# Patient Record
Sex: Male | Born: 1940 | Race: White | Hispanic: No | Smoking: Never smoker
Health system: Southern US, Community
[De-identification: ages and names within clinical notes are randomized; demographics above are authoritative.]

## PROBLEM LIST (undated history)

## (undated) ENCOUNTER — Emergency Department (HOSPITAL_COMMUNITY): Discharge status: Absent without leave | Payer: Medicare Other

## (undated) DIAGNOSIS — I639 Cerebral infarction, unspecified: Secondary | ICD-10-CM

## (undated) DIAGNOSIS — E039 Hypothyroidism, unspecified: Secondary | ICD-10-CM

## (undated) DIAGNOSIS — F1010 Alcohol abuse, uncomplicated: Secondary | ICD-10-CM

## (undated) DIAGNOSIS — K219 Gastro-esophageal reflux disease without esophagitis: Secondary | ICD-10-CM

## (undated) DIAGNOSIS — J189 Pneumonia, unspecified organism: Secondary | ICD-10-CM

## (undated) DIAGNOSIS — G9340 Encephalopathy, unspecified: Secondary | ICD-10-CM

## (undated) DIAGNOSIS — C61 Malignant neoplasm of prostate: Secondary | ICD-10-CM

## (undated) DIAGNOSIS — I10 Essential (primary) hypertension: Secondary | ICD-10-CM

## (undated) DIAGNOSIS — N400 Enlarged prostate without lower urinary tract symptoms: Secondary | ICD-10-CM

## (undated) DIAGNOSIS — F0390 Unspecified dementia without behavioral disturbance: Secondary | ICD-10-CM

## (undated) DIAGNOSIS — R569 Unspecified convulsions: Secondary | ICD-10-CM

## (undated) DIAGNOSIS — Z9889 Other specified postprocedural states: Secondary | ICD-10-CM

## (undated) HISTORY — PX: EYE SURGERY: SHX253

## (undated) HISTORY — DX: Unspecified convulsions: R56.9

## (undated) HISTORY — PX: PROSTATE BIOPSY: SHX241

---

## 2011-05-04 ENCOUNTER — Emergency Department (HOSPITAL_COMMUNITY): Payer: Medicare Other

## 2011-05-04 ENCOUNTER — Encounter (HOSPITAL_COMMUNITY): Payer: Self-pay | Admitting: Emergency Medicine

## 2011-05-04 ENCOUNTER — Emergency Department (HOSPITAL_COMMUNITY)
Admission: EM | Admit: 2011-05-04 | Discharge: 2011-05-04 | Disposition: A | Discharge status: Skilled Nursing Facility | Payer: Medicare Other | Attending: Emergency Medicine | Admitting: Emergency Medicine

## 2011-05-04 ENCOUNTER — Other Ambulatory Visit: Payer: Self-pay

## 2011-05-04 ENCOUNTER — Emergency Department (HOSPITAL_COMMUNITY)
Admission: EM | Admit: 2011-05-04 | Discharge: 2011-05-04 | Discharge status: Absent without leave | Payer: Medicare Other | Source: Home / Self Care | Attending: Emergency Medicine | Admitting: Emergency Medicine

## 2011-05-04 DIAGNOSIS — I10 Essential (primary) hypertension: Secondary | ICD-10-CM | POA: Insufficient documentation

## 2011-05-04 DIAGNOSIS — E876 Hypokalemia: Secondary | ICD-10-CM | POA: Insufficient documentation

## 2011-05-04 DIAGNOSIS — E039 Hypothyroidism, unspecified: Secondary | ICD-10-CM | POA: Insufficient documentation

## 2011-05-04 DIAGNOSIS — R55 Syncope and collapse: Secondary | ICD-10-CM | POA: Insufficient documentation

## 2011-05-04 DIAGNOSIS — M79609 Pain in unspecified limb: Secondary | ICD-10-CM | POA: Insufficient documentation

## 2011-05-04 DIAGNOSIS — R51 Headache: Secondary | ICD-10-CM | POA: Insufficient documentation

## 2011-05-04 DIAGNOSIS — F068 Other specified mental disorders due to known physiological condition: Secondary | ICD-10-CM | POA: Insufficient documentation

## 2011-05-04 DIAGNOSIS — E119 Type 2 diabetes mellitus without complications: Secondary | ICD-10-CM | POA: Insufficient documentation

## 2011-05-04 DIAGNOSIS — Z79899 Other long term (current) drug therapy: Secondary | ICD-10-CM | POA: Insufficient documentation

## 2011-05-04 DIAGNOSIS — Z7982 Long term (current) use of aspirin: Secondary | ICD-10-CM | POA: Insufficient documentation

## 2011-05-04 HISTORY — DX: Unspecified dementia without behavioral disturbance: F03.90

## 2011-05-04 HISTORY — DX: Benign prostatic hyperplasia without lower urinary tract symptoms: N40.0

## 2011-05-04 HISTORY — DX: Hypothyroidism, unspecified: E03.9

## 2011-05-04 HISTORY — DX: Pneumonia, unspecified organism: J18.9

## 2011-05-04 HISTORY — DX: Essential (primary) hypertension: I10

## 2011-05-04 HISTORY — DX: Encephalopathy, unspecified: G93.40

## 2011-05-04 HISTORY — DX: Unspecified dementia, unspecified severity, without behavioral disturbance, psychotic disturbance, mood disturbance, and anxiety: F03.90

## 2011-05-04 HISTORY — DX: Alcohol abuse, uncomplicated: F10.10

## 2011-05-04 LAB — BASIC METABOLIC PANEL
CO2: 24 mEq/L (ref 19–32)
Calcium: 9 mg/dL (ref 8.4–10.5)
Creatinine, Ser: 1.26 mg/dL (ref 0.50–1.35)
GFR calc Af Amer: 64 mL/min — ABNORMAL LOW (ref >90)
GFR calc non Af Amer: 55 mL/min — ABNORMAL LOW (ref >90)
Sodium: 138 mEq/L (ref 135–145)

## 2011-05-04 LAB — URINALYSIS, ROUTINE W REFLEX MICROSCOPIC
Ketones, ur: NEGATIVE mg/dL
Leukocytes, UA: NEGATIVE
Protein, ur: 30 mg/dL — AB
Urobilinogen, UA: 0.2 mg/dL (ref 0.0–1.0)

## 2011-05-04 LAB — DIFFERENTIAL
Basophils Absolute: 0 10*3/uL (ref 0.0–0.1)
Basophils Relative: 1 % (ref 0–1)
Eosinophils Absolute: 0.1 10*3/uL (ref 0.0–0.7)
Eosinophils Relative: 1 % (ref 0–5)
Lymphocytes Relative: 28 % (ref 12–46)

## 2011-05-04 LAB — CBC
MCHC: 34.3 g/dL (ref 30.0–36.0)
MCV: 107.9 fL — ABNORMAL HIGH (ref 78.0–100.0)
Platelets: 227 10*3/uL (ref 150–400)
RDW: 13.3 % (ref 11.5–15.5)
WBC: 5.2 10*3/uL (ref 4.0–10.5)

## 2011-05-04 LAB — URINE MICROSCOPIC-ADD ON

## 2011-05-04 MED ORDER — POTASSIUM CHLORIDE CRYS ER 20 MEQ PO TBCR
20.0000 meq | EXTENDED_RELEASE_TABLET | Freq: Once | ORAL | Status: AC
  Administered 2011-05-04: 20 meq via ORAL
  Filled 2011-05-04: qty 1

## 2011-05-04 MED ORDER — SODIUM CHLORIDE 0.9 % IV SOLN: INTRAVENOUS | Status: DC

## 2011-05-04 NOTE — ED Notes (Signed)
Per EMS, pt finished physical therapy at NH, was seen slumped in chair with LOC x 3 minutes.  CBG: 80; EKG shows R BBB.  PIV to L FA.  Pt just discharged from [facility] on 11/19 with pneumonia.

## 2011-05-04 NOTE — ED Notes (Signed)
Pt presents with report of syncopal episode after physical therapy today.  Per EMS, pt was seated with syncopal episode x 2-3 minutes.  Pt was gray-colored.  CBG: 80, PIV is 20g to L forearm.

## 2011-05-04 NOTE — ED Provider Notes (Signed)
History     CSN: [id number] Arrival date & time: 05/04/2011 12:03 PM  Patient is a 69 y.o. male presenting with syncope. The history is provided by the patient.  Loss of Consciousness This is a new problem. The current episode started less than 1 hour ago. The problem has been rapidly improving. Pertinent negatives include no chest pain, no abdominal pain, no headaches and no shortness of breath. Associated symptoms comments: The patint noticed left leg pain, like his sciatica, that caused him to have to stop doing an exercise. He then moved to a chair. He then was reported to have passed out. He had loss of consciousness for 3 minutes.. He has tried nothing for the symptoms.     The patient is in a rehabilitation following a hospital stay for pneumonia. He was moderately ill during the hospitalization, but has improved. His daughter, with him in the emergency room states that he is at his baseline at this time. He had no injuries or falls in the syncopal episode today. He denies chest pain, increased weakness, or dizziness. He has a mild headache at this time.   I had a telephone conversation with the patient's nurse, Shanda Bumps, at the facility, where he lives. Her background information of the episode this morning are as follows. He was sitting in a chair and passed out. He was moved to the floor. After 3 minutes of face mask oxygen, he came around. He then began talking and returned to his baseline. Vital signs, that are reported are pulse of 60 and pulse of 88. His CBG was 80. His blood pressure was 130/89. It is reported that he did not have a pulse. There is no reported electronic monitoring to indicate a cardiac arrhythmia or other abnormality. I also discussed the case with the facility supervisor, who arrived at the patient's side one minute after he passed out. At that time, he was sitting in a chair, and gray in color. The patient was assisted to the floor, and his feet were elevated. A face  mask was placed. After 3 minutes without assisted ventilation or cardiac compressions, he began to speak. Patient was confused for 1 minute and then he recovered to his baseline.     (Consider location/radiation/quality/duration/timing/severity/associated sxs/prior treatment) HPI  Past Medical History  Diagnosis Date  . Pneumonia   . Encephalopathy   . Dementia   . Hypertension   . Diabetes mellitus   . Hypothyroidism   . Benign prostatic hypertrophy   . Alcohol abuse     History reviewed. No pertinent past surgical history.  History reviewed. No pertinent family history.  History  Substance Use Topics  . Smoking status: Former Games developer  . Smokeless tobacco: Not on file  . Alcohol Use: Yes      Review of Systems  All other systems reviewed and are negative.    Allergies  Review of patient's allergies indicates no known allergies.  Home Medications   Current Outpatient Rx  Name Route Sig Dispense Refill  . ASPIRIN 81 MG PO TABS Oral Take 81 mg by mouth daily.      . DONEPEZIL HCL 5 MG PO TABS Oral Take 5 mg by mouth every evening.      Marland Kitchen DOXAZOSIN MESYLATE 2 MG PO TABS Oral Take 2 mg by mouth at bedtime.      Marland Kitchen FOLIC ACID 1 MG PO TABS Oral Take 1 mg by mouth daily.      Marland Kitchen GLIPIZIDE ER 2.5 MG PO  TB24 Oral Take 2.5 mg by mouth daily.      Marland Kitchen LEVOFLOXACIN 750 MG PO TABS Oral Take 750 mg by mouth daily. Start date 05/01/11 finish date 05/07/11     . LEVOTHYROXINE SODIUM 100 MCG PO TABS Oral Take 100 mcg by mouth daily.      Marland Kitchen LISINOPRIL 5 MG PO TABS Oral Take 5 mg by mouth daily.      Marland Kitchen METOPROLOL TARTRATE 25 MG PO TABS Oral Take 12.5 mg by mouth 2 (two) times daily.      Carma Leaven M PLUS PO TABS Oral Take 1 tablet by mouth daily.      Marland Kitchen TAMSULOSIN HCL 0.4 MG PO CAPS Oral Take 0.4 mg by mouth at bedtime.      Marland Kitchen VITAMIN B-1 100 MG PO TABS Oral Take 100 mg by mouth daily.        BP 152/80  Pulse 80  Temp(Src) 98.7 F (37.1 C) (Oral)  Resp 14  SpO2 98%  Physical  Exam  Constitutional: He appears well-developed and well-nourished.  HENT:  Head: Normocephalic and atraumatic.  Eyes: Pupils are equal, round, and reactive to light.  Neck: Normal range of motion. Neck supple.  Cardiovascular: Normal rate and regular rhythm.   Pulmonary/Chest: Effort normal and breath sounds normal.  Abdominal: Soft. Bowel sounds are normal.  Musculoskeletal: Normal range of motion.  Neurological: He is alert. No cranial nerve deficit. He exhibits normal muscle tone. Coordination normal.  Skin: Skin is warm and dry.  Psychiatric: He has a normal mood and affect. His behavior is normal.    ED Course  Procedures (including critical care time)  Date: 05/04/2011  Rate: 70  Rhythm: normal sinus rhythm  QRS Axis: normal  Intervals: normal  ST/T Wave abnormalities: normal  Conduction Disutrbances:right bundle branch block  Narrative Interpretation:   Old EKG Reviewed: none available    Labs Reviewed  CBC - Abnormal; Notable for the following:    RBC 3.16 (*)    Hemoglobin 11.7 (*)    HCT 34.1 (*)    MCV 107.9 (*)    MCH 37.0 (*)    All other components within normal limits  BASIC METABOLIC PANEL - Abnormal; Notable for the following:    Potassium 3.3 (*)    Glucose, Bld 103 (*)    GFR calc non Af Amer 55 (*)    GFR calc Af Amer 64 (*)    All other components within normal limits  URINALYSIS, ROUTINE W REFLEX MICROSCOPIC - Abnormal; Notable for the following:    Color, Urine AMBER (*) BIOCHEMICALS MAY BE AFFECTED BY COLOR   Protein, ur 30 (*)    All other components within normal limits  URINE MICROSCOPIC-ADD ON - Abnormal; Notable for the following:    Casts GRANULAR CAST (*) HYALINE CASTS   All other components within normal limits  DIFFERENTIAL   Dg Chest Port 1 View  05/04/2011  *RADIOLOGY REPORT*  Clinical Data: Syncope.  PORTABLE CHEST - 1 VIEW  Comparison: None.  Findings: Lungs are clear.  Heart size is normal.  No pneumothorax or pleural  effusion.  IMPRESSION: No acute disease.  Original Report Authenticated By: Bernadene Bell. D'ALESSIO, M.D.  15:02- reevaluation: He remains calm and comfortable. Cardiac monitor shows normal sinus rhythm. Patient relates that he is having pain in his sciatic area or in his right lateral ankle. He denies trauma to this area. He states that he is now having pain currently, but has been  having intermittent pain in the area recently. He has an additional complaint of not sleeping well since arriving to the rehabilitation facility. He states he gets interrupted frequently at nighttime. This could potentially be involved with his period of unresponsiveness.  Emergency Department treatment: By mouth potassium.  1. Syncope   2. Hypokalemia       MDM  Syncope, without clear cause. Evaluation negative the ED. It is possible that he had pain related syncope, possible vasovagal episode. The patient is improved, and has reached baseline with only abnormal finding being mild hypokalemia.        Flint Melter, MD 05/04/11 [phone]

## 2011-06-06 ENCOUNTER — Encounter (HOSPITAL_COMMUNITY): Payer: Self-pay | Admitting: *Deleted

## 2011-06-06 ENCOUNTER — Emergency Department (HOSPITAL_COMMUNITY): Payer: Medicare Other

## 2011-06-06 ENCOUNTER — Emergency Department (HOSPITAL_COMMUNITY)
Admission: EM | Admit: 2011-06-06 | Discharge: 2011-06-06 | Disposition: A | Discharge status: Home / Self Care | Payer: Medicare Other | Attending: Emergency Medicine | Admitting: Emergency Medicine

## 2011-06-06 DIAGNOSIS — F0390 Unspecified dementia without behavioral disturbance: Secondary | ICD-10-CM | POA: Insufficient documentation

## 2011-06-06 DIAGNOSIS — K5900 Constipation, unspecified: Secondary | ICD-10-CM | POA: Insufficient documentation

## 2011-06-06 DIAGNOSIS — K625 Hemorrhage of anus and rectum: Secondary | ICD-10-CM | POA: Insufficient documentation

## 2011-06-06 DIAGNOSIS — I10 Essential (primary) hypertension: Secondary | ICD-10-CM | POA: Insufficient documentation

## 2011-06-06 DIAGNOSIS — K648 Other hemorrhoids: Secondary | ICD-10-CM | POA: Insufficient documentation

## 2011-06-06 DIAGNOSIS — K5641 Fecal impaction: Secondary | ICD-10-CM | POA: Insufficient documentation

## 2011-06-06 DIAGNOSIS — E119 Type 2 diabetes mellitus without complications: Secondary | ICD-10-CM | POA: Insufficient documentation

## 2011-06-06 DIAGNOSIS — K6289 Other specified diseases of anus and rectum: Secondary | ICD-10-CM | POA: Insufficient documentation

## 2011-06-06 DIAGNOSIS — I76 Septic arterial embolism: Secondary | ICD-10-CM | POA: Insufficient documentation

## 2011-06-06 LAB — CBC
Hemoglobin: 12.6 g/dL — ABNORMAL LOW (ref 13.0–17.0)
MCH: 34.1 pg — ABNORMAL HIGH (ref 26.0–34.0)
MCV: 99.7 fL (ref 78.0–100.0)
Platelets: 276 10*3/uL (ref 150–400)
RBC: 3.69 MIL/uL — ABNORMAL LOW (ref 4.22–5.81)
WBC: 10.2 10*3/uL (ref 4.0–10.5)

## 2011-06-06 LAB — DIFFERENTIAL
Eosinophils Absolute: 0 10*3/uL (ref 0.0–0.7)
Eosinophils Relative: 0 % (ref 0–5)
Lymphocytes Relative: 36 % (ref 12–46)
Lymphs Abs: 3.6 10*3/uL (ref 0.7–4.0)
Monocytes Relative: 7 % (ref 3–12)
Neutrophils Relative %: 57 % (ref 43–77)

## 2011-06-06 LAB — BASIC METABOLIC PANEL
BUN: 24 mg/dL — ABNORMAL HIGH (ref 6–23)
CO2: 28 mEq/L (ref 19–32)
GFR calc non Af Amer: 59 mL/min — ABNORMAL LOW (ref >90)
Glucose, Bld: 118 mg/dL — ABNORMAL HIGH (ref 70–99)
Potassium: 4.3 mEq/L (ref 3.5–5.1)
Sodium: 137 mEq/L (ref 135–145)

## 2011-06-06 LAB — URINALYSIS, ROUTINE W REFLEX MICROSCOPIC
Glucose, UA: NEGATIVE mg/dL
Hgb urine dipstick: NEGATIVE
Specific Gravity, Urine: 1.033 — ABNORMAL HIGH (ref 1.005–1.030)

## 2011-06-06 MED ORDER — FLEET ENEMA 7-19 GM/118ML RE ENEM
1.0000 | ENEMA | Freq: Once | RECTAL | Status: DC
  Filled 2011-06-06: qty 1

## 2011-06-06 MED ORDER — SODIUM CHLORIDE 0.9 % IV BOLUS (SEPSIS): 2000.0000 mL | Freq: Once | INTRAVENOUS | Status: DC

## 2011-06-06 MED ORDER — POLYETHYLENE GLYCOL 3350 17 GM/SCOOP PO POWD: ORAL | Status: DC

## 2011-06-06 MED ORDER — BAZA PROTECT EX CREA: TOPICAL_CREAM | Freq: Two times a day (BID) | CUTANEOUS | Status: AC | PRN

## 2011-06-06 MED ORDER — SODIUM CHLORIDE 0.9 % IV BOLUS (SEPSIS)
500.0000 mL | Freq: Once | INTRAVENOUS | Status: AC
  Administered 2011-06-06: 500 mL via INTRAVENOUS

## 2011-06-06 MED ORDER — GLYCERIN (LAXATIVE) 2.1 G RE SUPP
1.0000 | Freq: Once | RECTAL | Status: AC
  Administered 2011-06-06: 1 via RECTAL
  Filled 2011-06-06: qty 1

## 2011-06-06 NOTE — ED Provider Notes (Signed)
History     CSN: [id number]  Arrival date & time 06/06/11  1638   First MD Initiated Contact with Patient 06/06/11 1701      Chief Complaint  Patient presents with  . Rectal Bleeding  . Constipation    (Consider location/radiation/quality/duration/timing/severity/associated sxs/prior treatment) Patient is a 70 y.o. male presenting with hematochezia and constipation. The history is provided by the patient and a relative.  Rectal Bleeding  The current episode started 3 to 5 days ago. The onset was gradual. The problem has been unchanged. The pain is mild. There was no prior successful therapy. Prior unsuccessful therapies include stool softeners, enemas and laxatives. Associated symptoms include anorexia and rectal pain. Pertinent negatives include no fever, no abdominal pain, no diarrhea, no nausea, no vomiting, no hematuria, no difficulty breathing and no rash. He has been behaving normally. He has been drinking less than usual.  Constipation  The current episode started 3 to 5 days ago. The problem has been unchanged. Associated symptoms include anorexia and rectal pain. Pertinent negatives include no fever, no abdominal pain, no diarrhea, no nausea, no vomiting, no hematuria, no difficulty breathing and no rash. He has been drinking less than usual.  Pt states he feels like he needs to have a bowel movement but unable to have one. Does not recall his last one but states it was at least 3-4 days ago. States also having rectal bleeding. Denies abdominal pain, fever, chills, nausea, vomiting. States no history of the same. Tried fleets enemas and biscodyl suppositories last night with no relief. Per family pt was in the hospital a month a go with pneumonia, hyeprglycemiea, AMS, UTI, improving now.   Past Medical History  Diagnosis Date  . Pneumonia   . Encephalopathy   . Dementia   . Hypertension   . Diabetes mellitus   . Hypothyroidism   . Benign prostatic hypertrophy   . Alcohol  abuse     History reviewed. No pertinent past surgical history.  No family history on file.  History  Substance Use Topics  . Smoking status: Former Games developer  . Smokeless tobacco: Not on file  . Alcohol Use: Yes     Hx of etoh abuse      Review of Systems  Constitutional: Negative for fever and chills.  HENT: Negative.   Eyes: Negative.   Respiratory: Negative.   Cardiovascular: Negative.   Gastrointestinal: Positive for constipation, hematochezia, anal bleeding, rectal pain and anorexia. Negative for nausea, vomiting, abdominal pain and diarrhea.  Genitourinary: Negative for dysuria, hematuria, decreased urine volume and difficulty urinating.  Musculoskeletal: Negative.   Skin: Negative for rash.  Neurological: Negative.   Psychiatric/Behavioral: Negative.     Allergies  Review of patient's allergies indicates no known allergies.  Home Medications   Current Outpatient Rx  Name Route Sig Dispense Refill  . ASPIRIN 81 MG PO TABS Oral Take 81 mg by mouth daily.      . DONEPEZIL HCL 5 MG PO TABS Oral Take 5 mg by mouth every evening.      Marland Kitchen DOXAZOSIN MESYLATE 2 MG PO TABS Oral Take 2 mg by mouth at bedtime.      Marland Kitchen FOLIC ACID 1 MG PO TABS Oral Take 1 mg by mouth daily.      Marland Kitchen GLIPIZIDE ER 2.5 MG PO TB24 Oral Take 2.5 mg by mouth daily.      Marland Kitchen LEVOFLOXACIN 750 MG PO TABS Oral Take 750 mg by mouth daily. Start date 05/01/11 finish  date 05/07/11     . LEVOTHYROXINE SODIUM 100 MCG PO TABS Oral Take 100 mcg by mouth daily.      Marland Kitchen LISINOPRIL 5 MG PO TABS Oral Take 5 mg by mouth daily.      Marland Kitchen METOPROLOL TARTRATE 25 MG PO TABS Oral Take 12.5 mg by mouth 2 (two) times daily.      Carma Leaven M PLUS PO TABS Oral Take 1 tablet by mouth daily.      Marland Kitchen TAMSULOSIN HCL 0.4 MG PO CAPS Oral Take 0.4 mg by mouth at bedtime.      Marland Kitchen VITAMIN B-1 100 MG PO TABS Oral Take 100 mg by mouth daily.        BP 146/99  Pulse 115  Temp(Src) 98.7 F (37.1 C) (Oral)  Resp 16  SpO2 100%  Physical Exam    Nursing note and vitals reviewed. Constitutional: He is oriented to person, place, and time. He appears well-developed and well-nourished.  HENT:  Head: Normocephalic and atraumatic.  Eyes: Conjunctivae are normal. Pupils are equal, round, and reactive to light.  Neck: Neck supple.  Cardiovascular: Regular rhythm.  Tachycardia present.   No murmur heard. Pulmonary/Chest: Effort normal and breath sounds normal. No respiratory distress. He has no wheezes.  Abdominal: Soft. Bowel sounds are normal. He exhibits no distension. There is no tenderness. There is no rebound and no guarding.  Genitourinary:       Fecal Impaction, skin around rectum erythmous, tender. Stool brown with bright red blood on top of stool.   Neurological: He is alert and oriented to person, place, and time.  Skin: Skin is warm and dry.  Psychiatric: He has a normal mood and affect.    ED Course  Procedures (including critical care time)  Results for orders placed during the hospital encounter of 06/06/11  CBC      Component Value Range   WBC 10.2  4.0 - 10.5 (K/uL)   RBC 3.69 (*) 4.22 - 5.81 (MIL/uL)   Hemoglobin 12.6 (*) 13.0 - 17.0 (g/dL)   HCT 40.9 (*) 81.1 - 52.0 (%)   MCV 99.7  78.0 - 100.0 (fL)   MCH 34.1 (*) 26.0 - 34.0 (pg)   MCHC 34.2  30.0 - 36.0 (g/dL)   RDW 91.4  78.2 - 95.6 (%)   Platelets 276  150 - 400 (K/uL)  DIFFERENTIAL      Component Value Range   Neutrophils Relative 57  43 - 77 (%)   Neutro Abs 5.8  1.7 - 7.7 (K/uL)   Lymphocytes Relative 36  12 - 46 (%)   Lymphs Abs 3.6  0.7 - 4.0 (K/uL)   Monocytes Relative 7  3 - 12 (%)   Monocytes Absolute 0.8  0.1 - 1.0 (K/uL)   Eosinophils Relative 0  0 - 5 (%)   Eosinophils Absolute 0.0  0.0 - 0.7 (K/uL)   Basophils Relative 0  0 - 1 (%)   Basophils Absolute 0.0  0.0 - 0.1 (K/uL)  BASIC METABOLIC PANEL      Component Value Range   Sodium 137  135 - 145 (mEq/L)   Potassium 4.3  3.5 - 5.1 (mEq/L)   Chloride 101  96 - 112 (mEq/L)   CO2 28   19 - 32 (mEq/L)   Glucose, Bld 118 (*) 70 - 99 (mg/dL)   BUN 24 (*) 6 - 23 (mg/dL)   Creatinine, Ser 2.13  0.50 - 1.35 (mg/dL)   Calcium 08.6  8.4 -  10.5 (mg/dL)   GFR calc non Af Amer 59 (*) >90 (mL/min)   GFR calc Af Amer 69 (*) >90 (mL/min)  URINALYSIS, ROUTINE W REFLEX MICROSCOPIC      Component Value Range   Color, Urine YELLOW  YELLOW    APPearance CLEAR  CLEAR    Specific Gravity, Urine 1.033 (*) 1.005 - 1.030    pH 6.0  5.0 - 8.0    Glucose, UA NEGATIVE  NEGATIVE (mg/dL)   Hgb urine dipstick NEGATIVE  NEGATIVE    Bilirubin Urine SMALL (*) NEGATIVE    Ketones, ur 15 (*) NEGATIVE (mg/dL)   Protein, ur NEGATIVE  NEGATIVE (mg/dL)   Urobilinogen, UA 0.2  0.0 - 1.0 (mg/dL)   Nitrite NEGATIVE  NEGATIVE    Leukocytes, UA NEGATIVE  NEGATIVE    Dg Abd 2 Views  06/06/2011  *RADIOLOGY REPORT*  Clinical Data: Abdominal pain, no bowel movement 3 days  ABDOMEN - 2 VIEW  Comparison: None  Findings: Lung bases clear. Nonobstructive bowel gas pattern. No bowel dilatation, bowel wall thickening, or free intraperitoneal air. No significant retained stool burden. Scattered pelvic phleboliths and atherosclerotic calcifications. Bones demineralized. No urinary tract calcification.  IMPRESSION: Nonobstructive bowel gas pattern.  Original Report Authenticated By: Lollie Marrow, M.D.    Pt disimpacted manually. Given fleets enema and suppository. Resulted in good and large bowel movement. Suspect bleeding is due to internal hemorrhoids. Pt has no abdominal tenderness. Hgb stable. No n/v, no signs of obstruction. Will d/c home with follow up.   No diagnosis found.    MDM          Lottie Mussel, PA 06/07/11 [phone]

## 2011-06-06 NOTE — ED Notes (Signed)
Patient transported to X-ray 

## 2011-06-06 NOTE — ED Notes (Signed)
Family at bedside. 

## 2011-06-06 NOTE — ED Notes (Signed)
Vital signs stable. 

## 2011-06-06 NOTE — ED Notes (Signed)
MD at bedside. 

## 2011-06-06 NOTE — ED Notes (Signed)
Patient is resting comfortably. 

## 2011-06-06 NOTE — ED Notes (Signed)
Pt from [facility], brought in by family. Nursing report sts pt had biscodyl supp and fleets enema last night with no effect. Began having rectal bleeding this am but pt reports bleeding began 2-3 days ago. Denies abd pain. C/o rectal pain.

## 2011-06-06 NOTE — ED Notes (Signed)
Patient denies pain and is resting comfortably.  

## 2011-06-07 NOTE — ED Provider Notes (Signed)
Medical screening examination/treatment/procedure(s) were conducted as a shared visit with non-physician practitioner(s) and myself.  I personally evaluated the patient during the encounter  Hurman Horn, MD 06/07/11 1314

## 2011-08-04 ENCOUNTER — Ambulatory Visit (HOSPITAL_COMMUNITY)
Admission: RE | Admit: 2011-08-04 | Discharge: 2011-08-04 | Disposition: A | Discharge status: Home / Self Care | Payer: Medicare Other | Source: Ambulatory Visit | Attending: Emergency Medicine | Admitting: Emergency Medicine

## 2011-08-04 ENCOUNTER — Other Ambulatory Visit (HOSPITAL_COMMUNITY): Payer: Self-pay | Admitting: Emergency Medicine

## 2011-08-04 DIAGNOSIS — R55 Syncope and collapse: Secondary | ICD-10-CM

## 2012-11-11 ENCOUNTER — Ambulatory Visit (INDEPENDENT_AMBULATORY_CARE_PROVIDER_SITE_OTHER): Payer: Medicare Other | Admitting: Ophthalmology

## 2012-11-11 DIAGNOSIS — E11319 Type 2 diabetes mellitus with unspecified diabetic retinopathy without macular edema: Secondary | ICD-10-CM

## 2012-11-11 DIAGNOSIS — I10 Essential (primary) hypertension: Secondary | ICD-10-CM

## 2012-11-11 DIAGNOSIS — H35039 Hypertensive retinopathy, unspecified eye: Secondary | ICD-10-CM

## 2012-11-11 DIAGNOSIS — H43819 Vitreous degeneration, unspecified eye: Secondary | ICD-10-CM

## 2012-11-11 DIAGNOSIS — E1139 Type 2 diabetes mellitus with other diabetic ophthalmic complication: Secondary | ICD-10-CM

## 2012-11-11 DIAGNOSIS — H2510 Age-related nuclear cataract, unspecified eye: Secondary | ICD-10-CM

## 2013-03-27 ENCOUNTER — Encounter (INDEPENDENT_AMBULATORY_CARE_PROVIDER_SITE_OTHER): Payer: Medicare Other | Admitting: Ophthalmology

## 2013-03-27 DIAGNOSIS — H43819 Vitreous degeneration, unspecified eye: Secondary | ICD-10-CM

## 2013-03-27 DIAGNOSIS — H2510 Age-related nuclear cataract, unspecified eye: Secondary | ICD-10-CM

## 2013-03-27 DIAGNOSIS — E11359 Type 2 diabetes mellitus with proliferative diabetic retinopathy without macular edema: Secondary | ICD-10-CM

## 2013-03-27 DIAGNOSIS — E11319 Type 2 diabetes mellitus with unspecified diabetic retinopathy without macular edema: Secondary | ICD-10-CM

## 2013-03-27 DIAGNOSIS — H4310 Vitreous hemorrhage, unspecified eye: Secondary | ICD-10-CM

## 2013-03-27 DIAGNOSIS — I10 Essential (primary) hypertension: Secondary | ICD-10-CM

## 2013-03-27 DIAGNOSIS — E1139 Type 2 diabetes mellitus with other diabetic ophthalmic complication: Secondary | ICD-10-CM

## 2013-03-27 DIAGNOSIS — H35039 Hypertensive retinopathy, unspecified eye: Secondary | ICD-10-CM

## 2013-05-27 ENCOUNTER — Ambulatory Visit (INDEPENDENT_AMBULATORY_CARE_PROVIDER_SITE_OTHER): Payer: Medicare Other | Admitting: Ophthalmology

## 2013-08-04 ENCOUNTER — Ambulatory Visit (INDEPENDENT_AMBULATORY_CARE_PROVIDER_SITE_OTHER): Payer: Medicare Other | Admitting: Ophthalmology

## 2013-08-04 DIAGNOSIS — H35039 Hypertensive retinopathy, unspecified eye: Secondary | ICD-10-CM

## 2013-08-04 DIAGNOSIS — E1165 Type 2 diabetes mellitus with hyperglycemia: Secondary | ICD-10-CM

## 2013-08-04 DIAGNOSIS — E1139 Type 2 diabetes mellitus with other diabetic ophthalmic complication: Secondary | ICD-10-CM

## 2013-08-04 DIAGNOSIS — E11319 Type 2 diabetes mellitus with unspecified diabetic retinopathy without macular edema: Secondary | ICD-10-CM

## 2013-08-04 DIAGNOSIS — H43819 Vitreous degeneration, unspecified eye: Secondary | ICD-10-CM

## 2013-08-04 DIAGNOSIS — I10 Essential (primary) hypertension: Secondary | ICD-10-CM

## 2013-08-04 DIAGNOSIS — H2510 Age-related nuclear cataract, unspecified eye: Secondary | ICD-10-CM

## 2013-08-04 DIAGNOSIS — E11359 Type 2 diabetes mellitus with proliferative diabetic retinopathy without macular edema: Secondary | ICD-10-CM

## 2013-08-15 DIAGNOSIS — E785 Hyperlipidemia, unspecified: Secondary | ICD-10-CM | POA: Insufficient documentation

## 2013-08-15 DIAGNOSIS — I10 Essential (primary) hypertension: Secondary | ICD-10-CM | POA: Diagnosis present

## 2013-08-15 DIAGNOSIS — IMO0002 Reserved for concepts with insufficient information to code with codable children: Secondary | ICD-10-CM | POA: Insufficient documentation

## 2013-08-15 DIAGNOSIS — I619 Nontraumatic intracerebral hemorrhage, unspecified: Secondary | ICD-10-CM | POA: Insufficient documentation

## 2013-08-16 DIAGNOSIS — K922 Gastrointestinal hemorrhage, unspecified: Secondary | ICD-10-CM | POA: Insufficient documentation

## 2014-01-25 DIAGNOSIS — H02233 Paralytic lagophthalmos right eye, unspecified eyelid: Secondary | ICD-10-CM | POA: Insufficient documentation

## 2014-02-03 ENCOUNTER — Ambulatory Visit (INDEPENDENT_AMBULATORY_CARE_PROVIDER_SITE_OTHER): Payer: Medicare Other | Admitting: Ophthalmology

## 2014-02-03 DIAGNOSIS — H35379 Puckering of macula, unspecified eye: Secondary | ICD-10-CM

## 2014-02-03 DIAGNOSIS — E11319 Type 2 diabetes mellitus with unspecified diabetic retinopathy without macular edema: Secondary | ICD-10-CM

## 2014-02-03 DIAGNOSIS — H43819 Vitreous degeneration, unspecified eye: Secondary | ICD-10-CM

## 2014-02-03 DIAGNOSIS — E1139 Type 2 diabetes mellitus with other diabetic ophthalmic complication: Secondary | ICD-10-CM

## 2014-02-03 DIAGNOSIS — H35039 Hypertensive retinopathy, unspecified eye: Secondary | ICD-10-CM

## 2014-02-03 DIAGNOSIS — I10 Essential (primary) hypertension: Secondary | ICD-10-CM

## 2014-02-03 DIAGNOSIS — E11359 Type 2 diabetes mellitus with proliferative diabetic retinopathy without macular edema: Secondary | ICD-10-CM

## 2014-02-03 DIAGNOSIS — E1165 Type 2 diabetes mellitus with hyperglycemia: Secondary | ICD-10-CM

## 2014-03-26 ENCOUNTER — Other Ambulatory Visit (HOSPITAL_COMMUNITY): Payer: Self-pay | Admitting: Internal Medicine

## 2014-03-26 DIAGNOSIS — R1314 Dysphagia, pharyngoesophageal phase: Secondary | ICD-10-CM

## 2014-03-31 ENCOUNTER — Ambulatory Visit (HOSPITAL_COMMUNITY)
Admission: RE | Admit: 2014-03-31 | Discharge: 2014-03-31 | Disposition: A | Discharge status: Home / Self Care | Payer: Medicare Other | Source: Ambulatory Visit | Attending: Internal Medicine | Admitting: Internal Medicine

## 2014-03-31 DIAGNOSIS — R1314 Dysphagia, pharyngoesophageal phase: Secondary | ICD-10-CM | POA: Diagnosis not present

## 2014-03-31 DIAGNOSIS — R1311 Dysphagia, oral phase: Secondary | ICD-10-CM | POA: Diagnosis not present

## 2014-03-31 DIAGNOSIS — R1313 Dysphagia, pharyngeal phase: Secondary | ICD-10-CM | POA: Insufficient documentation

## 2014-03-31 DIAGNOSIS — R05 Cough: Secondary | ICD-10-CM | POA: Diagnosis present

## 2014-03-31 DIAGNOSIS — I69351 Hemiplegia and hemiparesis following cerebral infarction affecting right dominant side: Secondary | ICD-10-CM | POA: Insufficient documentation

## 2014-03-31 DIAGNOSIS — Z8673 Personal history of transient ischemic attack (TIA), and cerebral infarction without residual deficits: Secondary | ICD-10-CM | POA: Diagnosis present

## 2014-03-31 NOTE — Procedures (Signed)
MBSS complete. Full report located under chart review in imaging section.  Kaelee Pfeffer Paiewonsky, M.A. CCC-SLP [phone]  

## 2014-04-08 ENCOUNTER — Ambulatory Visit: Payer: Medicare Other

## 2014-08-11 ENCOUNTER — Ambulatory Visit (INDEPENDENT_AMBULATORY_CARE_PROVIDER_SITE_OTHER): Payer: Medicare Other | Admitting: Ophthalmology

## 2014-08-11 DIAGNOSIS — I10 Essential (primary) hypertension: Secondary | ICD-10-CM | POA: Diagnosis not present

## 2014-08-11 DIAGNOSIS — E11329 Type 2 diabetes mellitus with mild nonproliferative diabetic retinopathy without macular edema: Secondary | ICD-10-CM | POA: Diagnosis not present

## 2014-08-11 DIAGNOSIS — H35033 Hypertensive retinopathy, bilateral: Secondary | ICD-10-CM

## 2014-08-11 DIAGNOSIS — E11319 Type 2 diabetes mellitus with unspecified diabetic retinopathy without macular edema: Secondary | ICD-10-CM

## 2014-08-11 DIAGNOSIS — H43813 Vitreous degeneration, bilateral: Secondary | ICD-10-CM

## 2014-08-11 DIAGNOSIS — E11359 Type 2 diabetes mellitus with proliferative diabetic retinopathy without macular edema: Secondary | ICD-10-CM

## 2014-08-11 DIAGNOSIS — H35372 Puckering of macula, left eye: Secondary | ICD-10-CM | POA: Diagnosis not present

## 2014-08-12 ENCOUNTER — Other Ambulatory Visit: Payer: Self-pay | Admitting: Gastroenterology

## 2014-08-14 ENCOUNTER — Encounter (HOSPITAL_COMMUNITY): Payer: Self-pay | Admitting: *Deleted

## 2014-08-24 ENCOUNTER — Encounter (HOSPITAL_COMMUNITY): Payer: Self-pay

## 2014-08-24 ENCOUNTER — Ambulatory Visit (HOSPITAL_COMMUNITY)
Admission: RE | Admit: 2014-08-24 | Discharge: 2014-08-24 | Disposition: A | Discharge status: Home / Self Care | Payer: Medicare Other | Source: Ambulatory Visit | Attending: Gastroenterology | Admitting: Gastroenterology

## 2014-08-24 ENCOUNTER — Ambulatory Visit (HOSPITAL_COMMUNITY): Payer: Medicare Other | Admitting: Certified Registered Nurse Anesthetist

## 2014-08-24 ENCOUNTER — Encounter (HOSPITAL_COMMUNITY)
Admission: RE | Disposition: A | Discharge status: Home / Self Care | Payer: Self-pay | Source: Ambulatory Visit | Attending: Gastroenterology

## 2014-08-24 DIAGNOSIS — E11319 Type 2 diabetes mellitus with unspecified diabetic retinopathy without macular edema: Secondary | ICD-10-CM | POA: Insufficient documentation

## 2014-08-24 DIAGNOSIS — Z1211 Encounter for screening for malignant neoplasm of colon: Secondary | ICD-10-CM | POA: Insufficient documentation

## 2014-08-24 DIAGNOSIS — E039 Hypothyroidism, unspecified: Secondary | ICD-10-CM | POA: Insufficient documentation

## 2014-08-24 DIAGNOSIS — I10 Essential (primary) hypertension: Secondary | ICD-10-CM | POA: Diagnosis not present

## 2014-08-24 DIAGNOSIS — Z87891 Personal history of nicotine dependence: Secondary | ICD-10-CM | POA: Diagnosis not present

## 2014-08-24 DIAGNOSIS — D125 Benign neoplasm of sigmoid colon: Secondary | ICD-10-CM | POA: Diagnosis not present

## 2014-08-24 DIAGNOSIS — Z8673 Personal history of transient ischemic attack (TIA), and cerebral infarction without residual deficits: Secondary | ICD-10-CM | POA: Insufficient documentation

## 2014-08-24 DIAGNOSIS — N400 Enlarged prostate without lower urinary tract symptoms: Secondary | ICD-10-CM | POA: Diagnosis not present

## 2014-08-24 DIAGNOSIS — K219 Gastro-esophageal reflux disease without esophagitis: Secondary | ICD-10-CM | POA: Insufficient documentation

## 2014-08-24 HISTORY — DX: Other specified postprocedural states: Z98.890

## 2014-08-24 HISTORY — DX: Cerebral infarction, unspecified: I63.9

## 2014-08-24 HISTORY — DX: Gastro-esophageal reflux disease without esophagitis: K21.9

## 2014-08-24 HISTORY — PX: COLONOSCOPY WITH PROPOFOL: SHX5780

## 2014-08-24 LAB — GLUCOSE, CAPILLARY: Glucose-Capillary: 80 mg/dL (ref 70–99)

## 2014-08-24 SURGERY — COLONOSCOPY WITH PROPOFOL
Anesthesia: Monitor Anesthesia Care

## 2014-08-24 MED ORDER — LIDOCAINE HCL (CARDIAC) 20 MG/ML IV SOLN
INTRAVENOUS | Status: AC
  Filled 2014-08-24: qty 5

## 2014-08-24 MED ORDER — PROPOFOL 10 MG/ML IV BOLUS
INTRAVENOUS | Status: AC
  Filled 2014-08-24: qty 20

## 2014-08-24 MED ORDER — METOCLOPRAMIDE HCL 5 MG/ML IJ SOLN
INTRAMUSCULAR | Status: AC
  Filled 2014-08-24: qty 2

## 2014-08-24 MED ORDER — METOCLOPRAMIDE HCL 5 MG/ML IJ SOLN
INTRAMUSCULAR | Status: DC | PRN
  Administered 2014-08-24: 10 mg via INTRAVENOUS

## 2014-08-24 MED ORDER — LIDOCAINE HCL (CARDIAC) 20 MG/ML IV SOLN
INTRAVENOUS | Status: DC | PRN
Start: 2014-08-24 — End: 2014-08-24
  Administered 2014-08-24: 50 mg via INTRAVENOUS

## 2014-08-24 MED ORDER — PROPOFOL 10 MG/ML IV BOLUS
INTRAVENOUS | Status: DC | PRN
  Administered 2014-08-24: 50 mg via INTRAVENOUS

## 2014-08-24 MED ORDER — SODIUM CHLORIDE 0.9 % IV SOLN: INTRAVENOUS | Status: DC

## 2014-08-24 MED ORDER — PROPOFOL INFUSION 10 MG/ML OPTIME
INTRAVENOUS | Status: DC | PRN
  Administered 2014-08-24: 200 ug/kg/min via INTRAVENOUS

## 2014-08-24 MED ORDER — ONDANSETRON HCL 4 MG/2ML IJ SOLN
INTRAMUSCULAR | Status: DC | PRN
  Administered 2014-08-24: 4 mg via INTRAVENOUS

## 2014-08-24 MED ORDER — KETAMINE HCL 10 MG/ML IJ SOLN
INTRAMUSCULAR | Status: DC | PRN
  Administered 2014-08-24: 5 mg via INTRAVENOUS

## 2014-08-24 MED ORDER — LACTATED RINGERS IV SOLN
INTRAVENOUS | Status: DC
  Administered 2014-08-24: 1000 mL via INTRAVENOUS

## 2014-08-24 MED ORDER — ONDANSETRON HCL 4 MG/2ML IJ SOLN
INTRAMUSCULAR | Status: AC
  Filled 2014-08-24: qty 2

## 2014-08-24 SURGICAL SUPPLY — 21 items

## 2014-08-24 NOTE — Transfer of Care (Signed)
Immediate Anesthesia Transfer of Care Note  Patient: Willie [name]  Procedure(s) Performed: Procedure(s): COLONOSCOPY WITH PROPOFOL (N/A)  Patient Location: Endo Recovery  Anesthesia Type:MAC  Level of Consciousness: Patient easily awoken, sedated, comfortable, cooperative, following commands, responds to stimulation.   Airway & Oxygen Therapy: Patient spontaneously breathing, ventilating well, oxygen via simple oxygen mask.  Post-op Assessment: Report given to PACU RN, vital signs reviewed and stable, moving all extremities.   Post vital signs: Reviewed and stable.  Complications: No apparent anesthesia complications

## 2014-08-24 NOTE — Anesthesia Postprocedure Evaluation (Signed)
Anesthesia Post Note  Patient: Willie [name]  Procedure(s) Performed: Procedure(s) (LRB): COLONOSCOPY WITH PROPOFOL (N/A)  Anesthesia type: MAC  Patient location: PACU  Post pain: Pain level controlled  Post assessment: Patient's Cardiovascular Status Stable  Last Vitals:  Filed Vitals:   08/24/14 1352  BP: 147/73  Pulse: 63  Temp:   Resp: 13    Post vital signs: Reviewed and stable  Level of consciousness: sedated  Complications: No apparent anesthesia complications

## 2014-08-24 NOTE — H&P (Signed)
  Procedure: Baseline screening colonoscopy  History: The patient is a 74 year old male born 01/02/1941. He is scheduled to undergo his first screening colonoscopy with polypectomy to prevent colon cancer.  Past medical history: Type 2 diabetes mellitus. Orthostatic hypotension. Hepatic steatosis. Hypothyroidism. Hypercholesterolemia. Benign prostatic hypertrophy. Memory loss. History of hypertension. Diabetic retinopathy. Microscopic hematuria. Stroke diagnosed in April 2015.  Exam: The patient is alert and lying comfortably on the endoscopy stretcher. Abdomen is soft and nontender to palpation. Lungs are clear to auscultation. Cardiac exam reveals a regular rhythm.  Plan: Proceed with baseline screening colonoscopy

## 2014-08-24 NOTE — Op Note (Signed)
Procedure: Baseline screening colonoscopy  Endoscopist: Earle Gell  Premedication: Propofol administered by anesthesia  Procedure: The patient was placed in the left lateral decubitus position. Anal inspection and digital rectal exam were normal. The Pentax pediatric colonoscope was introduced into the rectum and advanced to the cecum. A normal-appearing appendiceal orifice was identified. A normal-appearing ileocecal valve was identified. Colonic preparation for the exam today was good. Melanosis coli was present. Withdrawal time was 10 minutes  Rectum. Normal. Retroflexed view of the distal rectum normal  Sigmoid colon. From the mid sigmoid colon a 5 mm sessile polyp was removed with the cold snare  Descending colon. Normal  Splenic flexure. Normal  Transverse colon. Normal  Hepatic flexure. Normal  Ascending colon. Normal  Cecum and ileocecal valve. Normal  Assessment: A 5 mm sessile polyp was removed from the sigmoid colon with the cold snare. Melanosis coli was present. Otherwise normal colonoscopy

## 2014-08-24 NOTE — Discharge Instructions (Signed)
Colonoscopy, Care After °These instructions give you information on caring for yourself after your procedure. Your doctor may also give you more specific instructions. Call your doctor if you have any problems or questions after your procedure. °HOME CARE °· Do not drive for 24 hours. °· Do not sign important papers or use machinery for 24 hours. °· You may shower. °· You may go back to your usual activities, but go slower for the first 24 hours. °· Take rest breaks often during the first 24 hours. °· Walk around or use warm packs on your belly (abdomen) if you have belly cramping or gas. °· Drink enough fluids to keep your pee (urine) clear or pale yellow. °· Resume your normal diet. Avoid heavy or fried foods. °· Avoid drinking alcohol for 24 hours or as told by your doctor. °· Only take medicines as told by your doctor. °If a tissue sample (biopsy) was taken during the procedure:  °· Do not take aspirin or blood thinners for 7 days, or as told by your doctor. °· Do not drink alcohol for 7 days, or as told by your doctor. °· Eat soft foods for the first 24 hours. °GET HELP IF: °You still have a small amount of blood in your poop (stool) 2-3 days after the procedure. °GET HELP RIGHT AWAY IF: °· You have more than a small amount of blood in your poop. °· You see clumps of tissue (blood clots) in your poop. °· Your belly is puffy (swollen). °· You feel sick to your stomach (nauseous) or throw up (vomit). °· You have a fever. °· You have belly pain that gets worse and medicine does not help. °MAKE SURE YOU: °· Understand these instructions. °· Will watch your condition. °· Will get help right away if you are not doing well or get worse. °Document Released: 07/01/2010 Document Revised: 06/03/2013 Document Reviewed: 02/03/2013 °[employer]® Patient Information ©2015 [employer]. This information is not intended to replace advice given to you by your health care provider. Make sure you discuss any questions you have with  your health care provider. ° °

## 2014-08-24 NOTE — Anesthesia Preprocedure Evaluation (Signed)
Anesthesia Evaluation  Patient identified by MRN, date of birth, ID band Patient awake    Reviewed: Allergy & Precautions, NPO status , Patient's Chart, lab work & pertinent test results  Airway Mallampati: II  TM Distance: >3 FB Neck ROM: Full    Dental  (+) Teeth Intact, Dental Advisory Given   Pulmonary former smoker,    Pulmonary exam normal       Cardiovascular hypertension,     Neuro/Psych PSYCHIATRIC DISORDERS CVA, No Residual Symptoms    GI/Hepatic GERD-  ,  Endo/Other  diabetesHypothyroidism   Renal/GU      Musculoskeletal   Abdominal   Peds  Hematology   Anesthesia Other Findings   Reproductive/Obstetrics                             Anesthesia Physical Anesthesia Plan  ASA: III  Anesthesia Plan: MAC   Post-op Pain Management:    Induction: Intravenous  Airway Management Planned: Simple Face Mask  Additional Equipment:   Intra-op Plan:   Post-operative Plan:   Informed Consent: I have reviewed the patients History and Physical, chart, labs and discussed the procedure including the risks, benefits and alternatives for the proposed anesthesia with the patient or authorized representative who has indicated his/her understanding and acceptance.   Dental advisory given  Plan Discussed with: CRNA, Anesthesiologist and Surgeon  Anesthesia Plan Comments:         Anesthesia Quick Evaluation

## 2014-08-25 ENCOUNTER — Encounter (HOSPITAL_COMMUNITY): Payer: Self-pay | Admitting: Gastroenterology

## 2014-09-03 DIAGNOSIS — H2513 Age-related nuclear cataract, bilateral: Secondary | ICD-10-CM | POA: Insufficient documentation

## 2015-02-11 ENCOUNTER — Ambulatory Visit (INDEPENDENT_AMBULATORY_CARE_PROVIDER_SITE_OTHER): Payer: Medicare Other | Admitting: Ophthalmology

## 2015-02-26 ENCOUNTER — Ambulatory Visit (INDEPENDENT_AMBULATORY_CARE_PROVIDER_SITE_OTHER): Payer: Medicare Other | Admitting: Ophthalmology

## 2015-02-26 DIAGNOSIS — E11319 Type 2 diabetes mellitus with unspecified diabetic retinopathy without macular edema: Secondary | ICD-10-CM | POA: Diagnosis not present

## 2015-02-26 DIAGNOSIS — E11329 Type 2 diabetes mellitus with mild nonproliferative diabetic retinopathy without macular edema: Secondary | ICD-10-CM | POA: Diagnosis not present

## 2015-02-26 DIAGNOSIS — H35033 Hypertensive retinopathy, bilateral: Secondary | ICD-10-CM

## 2015-02-26 DIAGNOSIS — H43813 Vitreous degeneration, bilateral: Secondary | ICD-10-CM

## 2015-02-26 DIAGNOSIS — E11359 Type 2 diabetes mellitus with proliferative diabetic retinopathy without macular edema: Secondary | ICD-10-CM

## 2015-02-26 DIAGNOSIS — I10 Essential (primary) hypertension: Secondary | ICD-10-CM

## 2015-08-26 ENCOUNTER — Ambulatory Visit (INDEPENDENT_AMBULATORY_CARE_PROVIDER_SITE_OTHER): Payer: Medicare Other | Admitting: Ophthalmology

## 2015-08-26 DIAGNOSIS — E113591 Type 2 diabetes mellitus with proliferative diabetic retinopathy without macular edema, right eye: Secondary | ICD-10-CM

## 2015-08-26 DIAGNOSIS — H2513 Age-related nuclear cataract, bilateral: Secondary | ICD-10-CM

## 2015-08-26 DIAGNOSIS — H43813 Vitreous degeneration, bilateral: Secondary | ICD-10-CM

## 2015-08-26 DIAGNOSIS — I10 Essential (primary) hypertension: Secondary | ICD-10-CM | POA: Diagnosis not present

## 2015-08-26 DIAGNOSIS — H35372 Puckering of macula, left eye: Secondary | ICD-10-CM | POA: Diagnosis not present

## 2015-08-26 DIAGNOSIS — H35033 Hypertensive retinopathy, bilateral: Secondary | ICD-10-CM

## 2015-08-26 DIAGNOSIS — E113392 Type 2 diabetes mellitus with moderate nonproliferative diabetic retinopathy without macular edema, left eye: Secondary | ICD-10-CM | POA: Diagnosis not present

## 2015-08-26 DIAGNOSIS — E11319 Type 2 diabetes mellitus with unspecified diabetic retinopathy without macular edema: Secondary | ICD-10-CM | POA: Diagnosis not present

## 2016-02-28 ENCOUNTER — Ambulatory Visit (INDEPENDENT_AMBULATORY_CARE_PROVIDER_SITE_OTHER): Payer: Medicare Other | Admitting: Ophthalmology

## 2016-02-28 DIAGNOSIS — E113392 Type 2 diabetes mellitus with moderate nonproliferative diabetic retinopathy without macular edema, left eye: Secondary | ICD-10-CM

## 2016-02-28 DIAGNOSIS — I10 Essential (primary) hypertension: Secondary | ICD-10-CM

## 2016-02-28 DIAGNOSIS — E113591 Type 2 diabetes mellitus with proliferative diabetic retinopathy without macular edema, right eye: Secondary | ICD-10-CM | POA: Diagnosis not present

## 2016-02-28 DIAGNOSIS — E11319 Type 2 diabetes mellitus with unspecified diabetic retinopathy without macular edema: Secondary | ICD-10-CM | POA: Diagnosis not present

## 2016-02-28 DIAGNOSIS — H43813 Vitreous degeneration, bilateral: Secondary | ICD-10-CM

## 2016-02-28 DIAGNOSIS — H2513 Age-related nuclear cataract, bilateral: Secondary | ICD-10-CM

## 2016-02-28 DIAGNOSIS — H35372 Puckering of macula, left eye: Secondary | ICD-10-CM

## 2016-02-28 DIAGNOSIS — H35033 Hypertensive retinopathy, bilateral: Secondary | ICD-10-CM | POA: Diagnosis not present

## 2016-07-16 ENCOUNTER — Ambulatory Visit (HOSPITAL_COMMUNITY): Payer: Medicare Other

## 2016-07-16 ENCOUNTER — Encounter (HOSPITAL_COMMUNITY): Payer: Self-pay | Admitting: Emergency Medicine

## 2016-07-16 ENCOUNTER — Ambulatory Visit (HOSPITAL_COMMUNITY)
Admission: EM | Admit: 2016-07-16 | Discharge: 2016-07-16 | Disposition: A | Discharge status: Home / Self Care | Payer: Medicare Other | Attending: Family Medicine | Admitting: Family Medicine

## 2016-07-16 DIAGNOSIS — I10 Essential (primary) hypertension: Secondary | ICD-10-CM

## 2016-07-16 DIAGNOSIS — R05 Cough: Secondary | ICD-10-CM

## 2016-07-16 DIAGNOSIS — J988 Other specified respiratory disorders: Secondary | ICD-10-CM | POA: Diagnosis not present

## 2016-07-16 DIAGNOSIS — R059 Cough, unspecified: Secondary | ICD-10-CM

## 2016-07-16 MED ORDER — AZITHROMYCIN 250 MG PO TABS: 250.0000 mg | ORAL_TABLET | Freq: Every day | ORAL | 0 refills | Status: DC

## 2016-07-16 NOTE — ED Provider Notes (Signed)
CSN: [id number]     Arrival date & time 07/16/16  1459 History   First MD Initiated Contact with Patient 07/16/16 1558     Chief Complaint  Patient presents with  . Cough   (Consider location/radiation/quality/duration/timing/severity/associated sxs/prior Treatment) 76 yo caucasian male who carries a history of HTN and DM presents with "chest cold" x 3 days. It is productive (yellow). No blood. He reports some mild malaise. He has had pneumonia in the past and was worried. He denies fevers or chills. No nasal congestion is noted. No myalgias. He did have the flu vaccine.       Past Medical History:  Diagnosis Date  . Alcohol abuse   . Benign prostatic hypertrophy   . Dementia    daughter-in-law does not know of this   . Diabetes mellitus   . Encephalopathy   . GERD (gastroesophageal reflux disease)   . History of tarsorrhaphy    right eye  . Hypertension   . Hypothyroidism   . Pneumonia   . Stroke [facility])    right eye does not shut all the way-tarsorrhaphy   Past Surgical History:  Procedure Laterality Date  . COLONOSCOPY WITH PROPOFOL N/A 08/24/2014   Procedure: COLONOSCOPY WITH PROPOFOL;  Surgeon: Garlan Fair, MD;  Location: [facility];  Service: Endoscopy;  Laterality: N/A;  . EYE SURGERY     Laser eye surgery   History reviewed. No pertinent family history. Social History  Substance Use Topics  . Smoking status: Former Research scientist (life sciences)  . Smokeless tobacco: Not on file  . Alcohol use No     Comment: Hx of etoh abuse    Review of Systems  Constitutional: Positive for fatigue. Negative for chills and fever.  HENT: Negative.   Respiratory: Positive for cough. Negative for shortness of breath, wheezing and stridor.   Allergic/Immunologic: Negative.   Psychiatric/Behavioral: Negative.     Allergies  Aspirin  Home Medications   Prior to Admission medications   Medication Sig Start Date End Date Taking? Authorizing Provider  amLODipine (NORVASC) 5 MG tablet Take 5  mg by mouth every morning.   Yes Historical Provider, MD  Artificial Tear Ointment (LUBRICANT EYE) OINT Place 1 application into the right eye at bedtime. Attempt to tape right eye closed after administering lubricant. If unable to tape closed. Use eye patch.   Yes Historical Provider, MD  atorvastatin (LIPITOR) 20 MG tablet Take 20 mg by mouth at bedtime.   Yes Historical Provider, MD  docusate sodium (COLACE) 100 MG capsule Take 100 mg by mouth at bedtime.   Yes Historical Provider, MD  finasteride (PROSCAR) 5 MG tablet Take 5 mg by mouth every morning.   Yes Historical Provider, MD  glipiZIDE (GLUCOTROL XL) 2.5 MG 24 hr tablet Take 2.5 mg by mouth 2 (two) times daily.    Yes Historical Provider, MD  levothyroxine (SYNTHROID, LEVOTHROID) 112 MCG tablet Take 112 mcg by mouth daily before breakfast.   Yes Historical Provider, MD  losartan (COZAAR) 100 MG tablet Take 100 mg by mouth every morning.   Yes Historical Provider, MD  Memantine HCl-Donepezil HCl (NAMZARIC) 7 & 14 & 21 &28 -10 MG C4PK Take by mouth.   Yes Historical Provider, MD  metFORMIN (GLUCOPHAGE) 500 MG tablet Take 250 mg by mouth daily.     Yes Historical Provider, MD  metoprolol succinate (TOPROL-XL) 50 MG 24 hr tablet Take 50 mg by mouth every morning. Take with or immediately following a meal.   Yes Historical  Provider, MD  sennosides-docusate sodium (SENOKOT-S) 8.6-50 MG tablet Take 1 tablet by mouth every morning.   Yes Historical Provider, MD  solifenacin (VESICARE) 10 MG tablet Take by mouth daily.   Yes Historical Provider, MD  azithromycin (ZITHROMAX) 250 MG tablet Take 1 tablet (250 mg total) by mouth daily. Take first 2 tablets together, then 1 every day until finished. 07/16/16   Bjorn Pippin, PA-C  polyvinyl alcohol (LIQUIFILM TEARS) 1.4 % ophthalmic solution Place 1 drop into the right eye every 3 (three) hours.    Historical Provider, MD  thiamine (VITAMIN B-1) 100 MG tablet Take 100 mg by mouth daily.      Historical  Provider, MD   Meds Ordered and Administered this Visit  Medications - No data to display  BP (!) 142/47 (BP Location: Right Arm)   Pulse 64   Temp 97.4 F (36.3 C) (Oral)   Resp 20   SpO2 97%  No data found.   Physical Exam  Constitutional: He is oriented to person, place, and time. He appears well-developed and well-nourished. No distress.  Speaking in full sentences without stress. Got onto the exam table without difficulty  HENT:  Mouth/Throat: Oropharynx is clear and moist.  Cardiovascular: Normal rate and regular rhythm.   Pulmonary/Chest: Effort normal. No respiratory distress. He has no wheezes. He has no rales.  Few rhonchi and few basilar crackles that clear mildly with cough  Neurological: He is alert and oriented to person, place, and time.  Skin: Skin is warm and dry. He is not diaphoretic.  Psychiatric: His behavior is normal.  Nursing note and vitals reviewed.   Urgent Care Course     Procedures (including critical care time)  Labs Review Labs Reviewed - No data to display  Imaging Review Dg Chest 2 View  Result Date: 07/16/2016 CLINICAL DATA:  Chest congestion and cough for several days EXAM: CHEST  2 VIEW COMPARISON:  None. FINDINGS: Cardiac shadow is within normal limits. Aortic calcifications are noted. The lungs are well aerated bilaterally. No focal infiltrate or sizable effusion is seen. No bony abnormality is noted. IMPRESSION: No active cardiopulmonary disease. Electronically Signed   By: Inez Catalina M.D.   On: 07/16/2016 16:24     Visual Acuity Review  Right Eye Distance:   Left Eye Distance:   Bilateral Distance:    Right Eye Near:   Left Eye Near:    Bilateral Near:         MDM   1. Respiratory infection   2. Cough   3. Essential hypertension    1. CXR is clear, suspect viral but in the setting of co- morbidities and chest exam will cover for a bacterial infection as well. Treat with Z-max, Delsym and Mucinex. Hydration. Does  not meet criteria for FLU and had flu vaccine.  3. F/U with PCP    Bjorn Pippin, PA-C 07/16/16 [phone]

## 2016-07-16 NOTE — ED Triage Notes (Signed)
The patient presented to the [facility] with a complaint of a productive cough and chest congestion x 3 days.

## 2016-07-16 NOTE — Discharge Instructions (Signed)
Probably has a respiratory infection viral vs bacterial. At any rate will cover with an antibiotic for completeness, but his cough may linger if this is more viral. Treat cough with Delsym as directed every 12 hours and use Mucinex regular strength as directed as well. Rest and fluids are important also. If you worsen then f/u here or with your PCP.  FU with PCP re elevated BP.  Nice to meet you.

## 2016-08-28 ENCOUNTER — Ambulatory Visit (INDEPENDENT_AMBULATORY_CARE_PROVIDER_SITE_OTHER): Payer: Medicare Other | Admitting: Ophthalmology

## 2016-09-22 ENCOUNTER — Ambulatory Visit (INDEPENDENT_AMBULATORY_CARE_PROVIDER_SITE_OTHER): Payer: Medicare Other | Admitting: Ophthalmology

## 2016-09-22 DIAGNOSIS — E113392 Type 2 diabetes mellitus with moderate nonproliferative diabetic retinopathy without macular edema, left eye: Secondary | ICD-10-CM | POA: Diagnosis not present

## 2016-09-22 DIAGNOSIS — E11319 Type 2 diabetes mellitus with unspecified diabetic retinopathy without macular edema: Secondary | ICD-10-CM

## 2016-09-22 DIAGNOSIS — E113591 Type 2 diabetes mellitus with proliferative diabetic retinopathy without macular edema, right eye: Secondary | ICD-10-CM | POA: Diagnosis not present

## 2016-09-22 DIAGNOSIS — I10 Essential (primary) hypertension: Secondary | ICD-10-CM

## 2016-09-22 DIAGNOSIS — H43813 Vitreous degeneration, bilateral: Secondary | ICD-10-CM | POA: Diagnosis not present

## 2016-09-22 DIAGNOSIS — H35033 Hypertensive retinopathy, bilateral: Secondary | ICD-10-CM | POA: Diagnosis not present

## 2017-03-27 ENCOUNTER — Ambulatory Visit (INDEPENDENT_AMBULATORY_CARE_PROVIDER_SITE_OTHER): Payer: Medicare Other | Admitting: Ophthalmology

## 2017-04-04 ENCOUNTER — Ambulatory Visit (INDEPENDENT_AMBULATORY_CARE_PROVIDER_SITE_OTHER): Payer: Medicare Other | Admitting: Ophthalmology

## 2017-04-04 DIAGNOSIS — E11319 Type 2 diabetes mellitus with unspecified diabetic retinopathy without macular edema: Secondary | ICD-10-CM

## 2017-04-04 DIAGNOSIS — E113392 Type 2 diabetes mellitus with moderate nonproliferative diabetic retinopathy without macular edema, left eye: Secondary | ICD-10-CM

## 2017-04-04 DIAGNOSIS — I10 Essential (primary) hypertension: Secondary | ICD-10-CM

## 2017-04-04 DIAGNOSIS — E113591 Type 2 diabetes mellitus with proliferative diabetic retinopathy without macular edema, right eye: Secondary | ICD-10-CM | POA: Diagnosis not present

## 2017-04-04 DIAGNOSIS — H35033 Hypertensive retinopathy, bilateral: Secondary | ICD-10-CM

## 2017-04-04 DIAGNOSIS — H2513 Age-related nuclear cataract, bilateral: Secondary | ICD-10-CM | POA: Diagnosis not present

## 2017-04-04 DIAGNOSIS — H43813 Vitreous degeneration, bilateral: Secondary | ICD-10-CM

## 2017-10-08 ENCOUNTER — Encounter (INDEPENDENT_AMBULATORY_CARE_PROVIDER_SITE_OTHER): Payer: Medicare Other | Admitting: Ophthalmology

## 2017-10-08 DIAGNOSIS — H35033 Hypertensive retinopathy, bilateral: Secondary | ICD-10-CM

## 2017-10-08 DIAGNOSIS — E113392 Type 2 diabetes mellitus with moderate nonproliferative diabetic retinopathy without macular edema, left eye: Secondary | ICD-10-CM | POA: Diagnosis not present

## 2017-10-08 DIAGNOSIS — E113591 Type 2 diabetes mellitus with proliferative diabetic retinopathy without macular edema, right eye: Secondary | ICD-10-CM

## 2017-10-08 DIAGNOSIS — I10 Essential (primary) hypertension: Secondary | ICD-10-CM | POA: Diagnosis not present

## 2017-10-08 DIAGNOSIS — H43813 Vitreous degeneration, bilateral: Secondary | ICD-10-CM

## 2017-10-08 DIAGNOSIS — E11319 Type 2 diabetes mellitus with unspecified diabetic retinopathy without macular edema: Secondary | ICD-10-CM

## 2018-02-22 ENCOUNTER — Other Ambulatory Visit: Payer: Self-pay | Admitting: Urology

## 2018-02-22 DIAGNOSIS — C61 Malignant neoplasm of prostate: Secondary | ICD-10-CM

## 2018-04-12 ENCOUNTER — Encounter (INDEPENDENT_AMBULATORY_CARE_PROVIDER_SITE_OTHER): Payer: Medicare Other | Admitting: Ophthalmology

## 2018-04-12 DIAGNOSIS — H35033 Hypertensive retinopathy, bilateral: Secondary | ICD-10-CM

## 2018-04-12 DIAGNOSIS — I10 Essential (primary) hypertension: Secondary | ICD-10-CM

## 2018-04-12 DIAGNOSIS — E113392 Type 2 diabetes mellitus with moderate nonproliferative diabetic retinopathy without macular edema, left eye: Secondary | ICD-10-CM

## 2018-04-12 DIAGNOSIS — E11319 Type 2 diabetes mellitus with unspecified diabetic retinopathy without macular edema: Secondary | ICD-10-CM

## 2018-04-12 DIAGNOSIS — E113591 Type 2 diabetes mellitus with proliferative diabetic retinopathy without macular edema, right eye: Secondary | ICD-10-CM

## 2018-04-12 DIAGNOSIS — H2513 Age-related nuclear cataract, bilateral: Secondary | ICD-10-CM

## 2018-04-12 DIAGNOSIS — H43813 Vitreous degeneration, bilateral: Secondary | ICD-10-CM

## 2018-05-27 ENCOUNTER — Ambulatory Visit
Admission: RE | Admit: 2018-05-27 | Discharge: 2018-05-27 | Disposition: A | Discharge status: Home / Self Care | Payer: Medicare Other | Source: Ambulatory Visit | Attending: Urology | Admitting: Urology

## 2018-05-27 DIAGNOSIS — C61 Malignant neoplasm of prostate: Secondary | ICD-10-CM

## 2018-05-27 MED ORDER — GADOBENATE DIMEGLUMINE 529 MG/ML IV SOLN
17.0000 mL | Freq: Once | INTRAVENOUS | Status: AC | PRN
  Administered 2018-05-27: 17 mL via INTRAVENOUS

## 2018-10-11 ENCOUNTER — Encounter (INDEPENDENT_AMBULATORY_CARE_PROVIDER_SITE_OTHER): Payer: Medicare Other | Admitting: Ophthalmology

## 2018-10-24 ENCOUNTER — Encounter (INDEPENDENT_AMBULATORY_CARE_PROVIDER_SITE_OTHER): Payer: Medicare Other | Admitting: Ophthalmology

## 2018-12-11 ENCOUNTER — Emergency Department (HOSPITAL_COMMUNITY)
Admission: EM | Admit: 2018-12-11 | Discharge: 2018-12-11 | Disposition: A | Discharge status: Home / Self Care | Payer: Medicare Other | Attending: Emergency Medicine | Admitting: Emergency Medicine

## 2018-12-11 ENCOUNTER — Other Ambulatory Visit: Payer: Self-pay

## 2018-12-11 ENCOUNTER — Encounter (HOSPITAL_COMMUNITY): Payer: Self-pay

## 2018-12-11 ENCOUNTER — Emergency Department (HOSPITAL_COMMUNITY): Payer: Medicare Other

## 2018-12-11 DIAGNOSIS — R55 Syncope and collapse: Secondary | ICD-10-CM | POA: Diagnosis not present

## 2018-12-11 DIAGNOSIS — Z87891 Personal history of nicotine dependence: Secondary | ICD-10-CM | POA: Diagnosis not present

## 2018-12-11 DIAGNOSIS — E11649 Type 2 diabetes mellitus with hypoglycemia without coma: Secondary | ICD-10-CM | POA: Diagnosis not present

## 2018-12-11 DIAGNOSIS — I10 Essential (primary) hypertension: Secondary | ICD-10-CM | POA: Insufficient documentation

## 2018-12-11 DIAGNOSIS — E039 Hypothyroidism, unspecified: Secondary | ICD-10-CM | POA: Insufficient documentation

## 2018-12-11 DIAGNOSIS — F0390 Unspecified dementia without behavioral disturbance: Secondary | ICD-10-CM | POA: Insufficient documentation

## 2018-12-11 DIAGNOSIS — E162 Hypoglycemia, unspecified: Secondary | ICD-10-CM

## 2018-12-11 DIAGNOSIS — Z79899 Other long term (current) drug therapy: Secondary | ICD-10-CM | POA: Insufficient documentation

## 2018-12-11 DIAGNOSIS — Z7984 Long term (current) use of oral hypoglycemic drugs: Secondary | ICD-10-CM | POA: Diagnosis not present

## 2018-12-11 DIAGNOSIS — R4182 Altered mental status, unspecified: Secondary | ICD-10-CM | POA: Diagnosis present

## 2018-12-11 LAB — BASIC METABOLIC PANEL
Anion gap: 7 (ref 5–15)
BUN: 16 mg/dL (ref 8–23)
CO2: 25 mmol/L (ref 22–32)
Calcium: 9 mg/dL (ref 8.9–10.3)
Chloride: 96 mmol/L — ABNORMAL LOW (ref 98–111)
Creatinine, Ser: 1.04 mg/dL (ref 0.61–1.24)
GFR calc Af Amer: >60 mL/min (ref >60)
GFR calc non Af Amer: >60 mL/min (ref >60)
Glucose, Bld: 116 mg/dL — ABNORMAL HIGH (ref 70–99)
Potassium: 4.5 mmol/L (ref 3.5–5.1)
Sodium: 128 mmol/L — ABNORMAL LOW (ref 135–145)

## 2018-12-11 LAB — URINALYSIS, ROUTINE W REFLEX MICROSCOPIC
Bilirubin Urine: NEGATIVE
Glucose, UA: NEGATIVE mg/dL
Hgb urine dipstick: NEGATIVE
Ketones, ur: NEGATIVE mg/dL
Leukocytes,Ua: NEGATIVE
Nitrite: NEGATIVE
Protein, ur: NEGATIVE mg/dL
Specific Gravity, Urine: 1.009 (ref 1.005–1.030)
pH: 6 (ref 5.0–8.0)

## 2018-12-11 LAB — CBC
HCT: 33.3 % — ABNORMAL LOW (ref 39.0–52.0)
Hemoglobin: 11 g/dL — ABNORMAL LOW (ref 13.0–17.0)
MCH: 31.9 pg (ref 26.0–34.0)
MCHC: 33 g/dL (ref 30.0–36.0)
MCV: 96.5 fL (ref 80.0–100.0)
Platelets: 275 10*3/uL (ref 150–400)
RBC: 3.45 MIL/uL — ABNORMAL LOW (ref 4.22–5.81)
RDW: 12 % (ref 11.5–15.5)
WBC: 5.7 10*3/uL (ref 4.0–10.5)
nRBC: 0 % (ref 0.0–0.2)

## 2018-12-11 LAB — CBG MONITORING, ED
Glucose-Capillary: 106 mg/dL — ABNORMAL HIGH (ref 70–99)
Glucose-Capillary: 80 mg/dL (ref 70–99)

## 2018-12-11 MED ORDER — SODIUM CHLORIDE 0.9 % IV BOLUS
1000.0000 mL | Freq: Once | INTRAVENOUS | Status: AC
  Administered 2018-12-11: 11:00:00 1000 mL via INTRAVENOUS

## 2018-12-11 NOTE — ED Notes (Signed)
Bed: OI51 Expected date:  Expected time:  Means of arrival:  Comments: EMS/hypoglycemia/resolved

## 2018-12-11 NOTE — ED Notes (Signed)
[name], patient's daughterin-law notified of patient's discharge and the need to see case management prior to being discharged.

## 2018-12-11 NOTE — ED Notes (Signed)
Patient given [place] sandwich, cran/grape juice and stick cheese.

## 2018-12-11 NOTE — TOC Initial Note (Addendum)
Transition of Care [facility]) - Initial/Assessment Note    Patient Details  Name: Willie [name] MRN: [id number] Date of Birth: Jun 27, 1940  [facility]) CM/SW Contact:    Erenest Rasher, RN Phone Number: [phone] 12/11/2018, 3:54 PM  Clinical Narrative:                 TOC CM spoke to pt and gave permission to speak to dtr in law, Isidoro Donning. Contacted [name] and states pt lives in his own apt at [employer] [facility] [employer]. Offered choice for [facility]. They provide 2 meals per day for pt per day. States she was going to assist him but due to COVID 19 she is not able to help. Contacted [facility] with new referral. They accepted with start of care December 18, 2018 due to holidays. Made dtr in law aware.   Expected Discharge Plan: [place] Barriers to Discharge: No Barriers Identified   Patient Goals and CMS Choice Patient states their goals for this hospitalization and ongoing recovery are:: ready to go home CMS Medicare.gov Compare Post Acute Care list provided to:: Patient Represenative (must comment)(daughter in law, Glenice Laine) Choice offered to / list presented to : Adult Children  Expected Discharge Plan and Services Expected Discharge Plan: [place] Choice: [place] arrangements for the past 2 months: [place]                           HH Arranged: RN, PT, Nurse's Aide, Social Work [employer] Agency: [facility] [facility] [employer] Date [place]: 12/11/18 Time [place]: [place] Representative spoke with at [place]: Burlene Arnt  Prior Living Arrangements/Services Living arrangements for the past 2 months: [place] Lives with:: Self Patient language and need for interpreter reviewed:: Yes Do you feel safe going back to the place where you live?: Yes      Need for Family Participation in Patient Care: No (Comment) Care giver support  system in place?: No (comment) Current home services: DME(rollator) Criminal Activity/Legal Involvement Pertinent to Current Situation/Hospitalization: No - Comment as needed  Activities of Daily Living      Permission Sought/Granted Permission sought to share information with : Case Manager Permission granted to share information with : Yes, Verbal Permission Granted  Share Information with NAME: Breyon Sigg  Permission granted to share info w AGENCY: [facility] granted to share info w Relationship: daughter in law  Permission granted to share info w Contact Information: [phone]  Emotional Assessment Appearance:: Appears stated age Attitude/Demeanor/Rapport: Engaged Affect (typically observed): Accepting Orientation: : Oriented to Self, Oriented to Place, Oriented to  Time, Oriented to Situation   Psych Involvement: No (comment)  Admission diagnosis:  hypoglycemia There are no active problems to display for this patient.  PCP:  Josetta Huddle, MD Pharmacy:   [facility], [place] - [address] [place] [address] Phone: [phone] Fax: [phone]  [facility] [place] [address] Phone: [phone] Fax: [phone]     Social Determinants of Health (SDOH) Interventions    Readmission Risk Interventions No flowsheet data found.

## 2018-12-11 NOTE — Progress Notes (Signed)
  Medicare.gov - the Conservation officer, historic buildings for [place] health agencies that serve [phone]. [place] Quality of Patient Care Rating Patient Survey Summary Rating [facility] [phone] 4 out of 5 stars 4 out of [place] [phone] 3 out of 5 stars 4 out of [place] [phone]) [phone] 4  out of 5 stars 3 out of [place] [phone] 4 out of 5 stars 4 out of [place] [phone] 4  out of 5 stars 4 out of [place] [phone] 4 out of 5 stars 4 out of [place] [phone] 4 out of 5 stars 4 out of 5 stars [facility] [phone] 3  out of 5 stars 4 out of [place] [phone] 3 out of 5 stars [facility] [place] [phone] 3 out of 5 stars 4 out of 5 stars [facility] [phone] 4 out of 5 stars Not Available12 [facility] (336) [phone] 3  out of 5 stars 3 out of [place] [phone] 3  out of 5 stars 4 out of [place] [phone] 3  out of 5 stars 3 out of [place] [phone] 3  out of 5 stars Not [place] [phone] 4  out of 5 stars 3 out of [place] [phone] 4  out of 5 stars 2 out of [place] number Footnote as displayed on [place] 1 This agency provides services under a federal waiver program to non-traditional, chronic long term population. 2 This agency provides services to a special needs population. 3 Not Available. 4 The number of patient episodes for this  measure is too small to report. 5 This measure currently does not have data or provider has been certified/recertified for less than 6 months. 6 The national average for this measure is not provided because of state-to-state differences in data collection. 7 Medicare is not displaying rates for this measure for any home health agency, because of an issue with the data. 8 There were problems with the data and they are being corrected. 9 Zero, or very few, patients met the survey's rules for inclusion. The scores shown, if any, reflect a very small number of surveys and may not accurately tell how an agency is doing. 10 Survey results are based on less than 12 months of data. 11 Fewer than 70 patients completed the survey. Use the scores shown, if any, with caution as the number of surveys may be too low to accurately tell how an agency is doing. 12 No survey results are available for this period. 13 Data suppressed by CMS for one or more quarters.

## 2018-12-11 NOTE — ED Notes (Signed)
Pt ambulate from the room to the bathroom and back with his walker done very wel

## 2018-12-11 NOTE — ED Notes (Signed)
Patient transported to CT 

## 2018-12-11 NOTE — ED Triage Notes (Signed)
Per EMS- Patient is a resident of [facility] and staff found the patient on the floor unresponsive. CBG-56. CBG increased to 149 after D10 25 gms given Patient is also eating a peanut butter sandwich. Patient is alert and oriented x 4. Patient has a history of stroke with right facial droop.

## 2018-12-11 NOTE — Discharge Instructions (Signed)
It was my pleasure taking care of you today!  ° °Follow up with your primary care doctor.  ° °Return to ER for new or worsening symptoms, any additional concerns.  °

## 2018-12-11 NOTE — ED Provider Notes (Signed)
[facility] DEPT Provider Note   CSN: [id number] Arrival date & time: 12/11/18  1009    History   Chief Complaint Chief Complaint  Patient presents with  . Hypoglycemia    HPI Willie [name] is a 78 y.o. male.     The history is provided by the patient and medical records. No language interpreter was used.  Hypoglycemia   Willie [name] is a 78 y.o. male  with a PMH as listed below including DM who presents to the Emergency Department from facility for evaluation of mental status change.  Per EMS, patient is a resident of independent living facility and was found on the floor of his room unresponsive by cleaning staff.  CBG was 56.  He was given 25 of D10 and became much more alert.  He was eating a peanut butter sandwich upon arrival to the emergency department.  Patient states that he remembers being at his fridge earlier this morning, then waking up with EMS around him.  He denies any type of prodromal symptoms.  Never had any chest pain or shortness of breath.  He denies any pain of any sort and has no complaints currently.   Past Medical History:  Diagnosis Date  . Alcohol abuse   . Benign prostatic hypertrophy   . Dementia [facility])    daughter-in-law does not know of this   . Diabetes mellitus   . Encephalopathy   . GERD (gastroesophageal reflux disease)   . History of tarsorrhaphy    right eye  . Hypertension   . Hypothyroidism   . Pneumonia   . Stroke [facility])    right eye does not shut all the way-tarsorrhaphy    There are no active problems to display for this patient.   Past Surgical History:  Procedure Laterality Date  . COLONOSCOPY WITH PROPOFOL N/A 08/24/2014   Procedure: COLONOSCOPY WITH PROPOFOL;  Surgeon: Garlan Fair, MD;  [facility];  Service: Endoscopy;  Laterality: N/A;  . EYE SURGERY     Laser eye surgery        Home Medications    Prior to Admission medications   Medication Sig Start Date End  Date Taking? Authorizing Provider  amLODipine (NORVASC) 5 MG tablet Take 5 mg by mouth every morning.   Yes [provider]  Artificial Tear Ointment (LUBRICANT EYE) OINT Place 1 application into the right eye at bedtime. Attempt to tape right eye closed after administering lubricant. If unable to tape closed. Use eye patch.   Yes [provider]  atorvastatin (LIPITOR) 10 MG tablet Take 10 mg by mouth every evening.   Yes [provider]  atorvastatin (LIPITOR) 20 MG tablet Take 20 mg by mouth at bedtime.   Yes [provider]  finasteride (PROSCAR) 5 MG tablet Take 5 mg by mouth every morning.   Yes [provider]  glipiZIDE (GLUCOTROL XL) 2.5 MG 24 hr tablet Take 2.5 mg by mouth daily with breakfast.    Yes [provider]  levothyroxine (SYNTHROID) 137 MCG tablet Take 137 mcg by mouth daily before breakfast.   Yes [provider]  Memantine HCl-Donepezil HCl (NAMZARIC) 28-10 MG CP24 Take 1 capsule by mouth daily.   Yes [provider]  metFORMIN (GLUCOPHAGE) 500 MG tablet Take 250 mg by mouth 2 (two) times daily with a meal.    Yes [provider]  metoprolol succinate (TOPROL-XL) 50 MG 24 hr tablet Take 50 mg by mouth  every morning. Take with or immediately following a meal.   Yes [provider]  mirabegron ER (MYRBETRIQ) 50 MG TB24 tablet Take 50 mg by mouth every evening.   Yes [provider]  Multiple Vitamins-Minerals (MULTIVITAMIN ADULTS) TABS Take 1 tablet by mouth daily.   Yes [provider]  sennosides-docusate sodium (SENOKOT-S) 8.6-50 MG tablet Take 1 tablet by mouth every morning.   Yes [provider]  sodium chloride 1 g tablet Take 1 g by mouth daily.   Yes [provider]  solifenacin (VESICARE) 10 MG tablet Take 10 mg by mouth every evening.    Yes [provider]  valsartan (DIOVAN) 80 MG tablet Take 80 mg by mouth every evening.   Yes  [provider]  Vitamin E 400 units TABS Take 1 tablet by mouth daily.   Yes [provider]  azithromycin (ZITHROMAX) 250 MG tablet Take 1 tablet (250 mg total) by mouth daily. Take first 2 tablets together, then 1 every day until finished. Patient not taking: Reported on 12/11/2018 07/16/16   Prudencio Pair    Family History History reviewed. No pertinent family history.  Social History Social History   Tobacco Use  . Smoking status: Former Smoker  Substance Use Topics  . Alcohol use: No    Comment: Hx of etoh abuse  . Drug use: No     Allergies   Aspirin   Review of Systems Review of Systems  Neurological: Positive for syncope.  All other systems reviewed and are negative.    Physical Exam Updated Vital Signs BP (!) 133/56   Pulse 66   Temp 97.6 F (36.4 C) (Oral)   Resp 11   Ht 6' (1.829 m)   Wt 74.8 kg   SpO2 100%   BMI 22.38 kg/m   Physical Exam Vitals signs and nursing note reviewed.  Constitutional:      General: He is not in acute distress.    Appearance: He is well-developed.  HENT:     Head: Normocephalic and atraumatic.  Neck:     Musculoskeletal: Neck supple.  Cardiovascular:     Rate and Rhythm: Normal rate and regular rhythm.     Heart sounds: Normal heart sounds. No murmur.  Pulmonary:     Effort: Pulmonary effort is normal. No respiratory distress.     Breath sounds: Normal breath sounds.  Abdominal:     General: There is no distension.     Palpations: Abdomen is soft.     Tenderness: There is no abdominal tenderness.  Musculoskeletal:     Comments: No midline C/T/L-spine tenderness.  No tenderness to the pelvis or extremities.  Skin:    General: Skin is warm and dry.  Neurological:     Mental Status: He is alert.     Comments: Alert, oriented, thought content appropriate, able to give a coherent history. Speech is clear and goal oriented, able to follow commands.  Right facial droop which is baseline  deficit from previous stroke.  Otherwise cranial nerves II through XII are grossly intact.  Strength and sensation intact as well.      ED Treatments / Results  Labs (all labs ordered are listed, but only abnormal results are displayed) Labs Reviewed  BASIC METABOLIC PANEL - Abnormal; Notable for the following components:      Result Value   Sodium 128 (*)    Chloride 96 (*)    Glucose, Bld 116 (*)    All  other components within normal limits  CBC - Abnormal; Notable for the following components:   RBC 3.45 (*)    Hemoglobin 11.0 (*)    HCT 33.3 (*)    All other components within normal limits  CBG MONITORING, ED - Abnormal; Notable for the following components:   Glucose-Capillary 106 (*)    All other components within normal limits  URINALYSIS, ROUTINE W REFLEX MICROSCOPIC  CBG MONITORING, ED    EKG EKG Interpretation  Date/Time:  Wednesday December 11 2018 11:10:46 EDT Ventricular Rate:  69 PR Interval:    QRS Duration: 176 QT Interval:  444 QTC Calculation: 476 R Axis:   62 Text Interpretation:  Sinus rhythm Right bundle branch block No old tracing to compare Confirmed by Jola Schmidt [phone]) on 12/11/2018 11:14:19 AM   Radiology Ct Head Wo Contrast  Result Date: 12/11/2018 CLINICAL DATA:  Found unresponsive. EXAM: CT HEAD WITHOUT CONTRAST TECHNIQUE: Contiguous axial images were obtained from the base of the skull through the vertex without intravenous contrast. COMPARISON:  Head CT 01/15/2011 FINDINGS: Brain: Stable age related cerebral atrophy, ventriculomegaly and periventricular white matter disease. No extra-axial fluid collections are identified. No CT findings for acute hemispheric infarction or intracranial hemorrhage. No mass lesions. The brainstem and cerebellum are normal. Vascular: Vascular calcifications but no aneurysm or hyperdense vessels. Skull: No skull fracture or bone lesion. Sinuses/Orbits: The paranasal sinuses and mastoid air cells are clear. The globes  are intact. Other: No scalp lesions or scalp hematoma. IMPRESSION: Age related cerebral atrophy, ventriculomegaly and periventricular white matter disease but no acute intracranial findings. Electronically Signed   By: Marijo Sanes M.D.   On: 12/11/2018 12:33    Procedures Procedures (including critical care time)  Medications Ordered in ED Medications  sodium chloride 0.9 % bolus 1,000 mL (0 mLs Intravenous Stopped 12/11/18 1335)     Initial Impression / Assessment and Plan / ED Course  I have reviewed the triage vital signs and the nursing notes.  Pertinent labs & imaging results that were available during my care of the patient were reviewed by me and considered in my medical decision making (see chart for details).       Willie [name] is a 78 y.o. male who presents to ED after being found on the floor by clinic staff at independent living facility.  Patient remembers standing next to his refrigerator this morning, the next recollection is EMS arrival.  He was hypoglycemic on the scene and did seem to have significant improvement in mental status after being given D10.  Afebrile and hemodynamically stable upon arrival.  A&Ox4.  CBG 106.  Has right facial droop which is deficit from previous stroke, but no new cranial nerve deficits on exam today.  Labs show a sodium of 128.  Does appear dry on exam.  Will give IV fluid bolus.  EKG without arrhythmia or acute ischemic changes. CT head negative. CBG has been stable. Tolerating PO, ambulatory. Home health will likely benefit patient. Case mgt consulted.  Evaluation does not show pathology that would require ongoing emergent intervention or inpatient treatment. PCP follow up encouraged. All questions answered.   Patient discussed with Dr. Venora Maples who agrees with treatment plan.    Final Clinical Impressions(s) / ED Diagnoses   Final diagnoses:  Hypoglycemia    ED Discharge Orders         [place]     12/11/18 1456     Face-to-face encounter (required for  Medicare/Medicaid patients)    Comments: I Jola Schmidt certify that this patient is under my care and that I, or a nurse practitioner or physician's assistant working with me, had a face-to-face encounter that meets the physician face-to-face encounter requirements with this patient on 12/11/2018. The encounter with the patient was in whole, or in part for the following medical condition(s) which is the primary reason for home health care (List medical condition): Generalized weakness, hypoglycemia   12/11/18 1456           Ward, Ozella Almond, PA-C 12/11/18 [place]wick, Kevin, MD 12/11/18 1521

## 2018-12-17 ENCOUNTER — Encounter (INDEPENDENT_AMBULATORY_CARE_PROVIDER_SITE_OTHER): Payer: Medicare Other | Admitting: Ophthalmology

## 2018-12-17 ENCOUNTER — Other Ambulatory Visit: Payer: Self-pay

## 2018-12-17 DIAGNOSIS — E113392 Type 2 diabetes mellitus with moderate nonproliferative diabetic retinopathy without macular edema, left eye: Secondary | ICD-10-CM | POA: Diagnosis not present

## 2018-12-17 DIAGNOSIS — E113591 Type 2 diabetes mellitus with proliferative diabetic retinopathy without macular edema, right eye: Secondary | ICD-10-CM | POA: Diagnosis not present

## 2018-12-17 DIAGNOSIS — E11319 Type 2 diabetes mellitus with unspecified diabetic retinopathy without macular edema: Secondary | ICD-10-CM | POA: Diagnosis not present

## 2018-12-17 DIAGNOSIS — H43813 Vitreous degeneration, bilateral: Secondary | ICD-10-CM

## 2018-12-17 DIAGNOSIS — H2513 Age-related nuclear cataract, bilateral: Secondary | ICD-10-CM

## 2018-12-17 DIAGNOSIS — I10 Essential (primary) hypertension: Secondary | ICD-10-CM

## 2018-12-17 DIAGNOSIS — H35033 Hypertensive retinopathy, bilateral: Secondary | ICD-10-CM

## 2018-12-28 ENCOUNTER — Emergency Department (HOSPITAL_COMMUNITY)
Admission: EM | Admit: 2018-12-28 | Discharge: 2018-12-28 | Disposition: A | Discharge status: Home / Self Care | Payer: Medicare Other | Attending: Emergency Medicine | Admitting: Emergency Medicine

## 2018-12-28 ENCOUNTER — Encounter (HOSPITAL_COMMUNITY): Payer: Self-pay | Admitting: Emergency Medicine

## 2018-12-28 ENCOUNTER — Emergency Department (HOSPITAL_COMMUNITY): Payer: Medicare Other

## 2018-12-28 ENCOUNTER — Other Ambulatory Visit: Payer: Self-pay

## 2018-12-28 DIAGNOSIS — R410 Disorientation, unspecified: Secondary | ICD-10-CM | POA: Diagnosis present

## 2018-12-28 DIAGNOSIS — E039 Hypothyroidism, unspecified: Secondary | ICD-10-CM | POA: Diagnosis not present

## 2018-12-28 DIAGNOSIS — Z79899 Other long term (current) drug therapy: Secondary | ICD-10-CM | POA: Diagnosis not present

## 2018-12-28 DIAGNOSIS — E119 Type 2 diabetes mellitus without complications: Secondary | ICD-10-CM | POA: Diagnosis not present

## 2018-12-28 DIAGNOSIS — Z7984 Long term (current) use of oral hypoglycemic drugs: Secondary | ICD-10-CM | POA: Insufficient documentation

## 2018-12-28 DIAGNOSIS — I10 Essential (primary) hypertension: Secondary | ICD-10-CM | POA: Insufficient documentation

## 2018-12-28 DIAGNOSIS — Z87891 Personal history of nicotine dependence: Secondary | ICD-10-CM | POA: Diagnosis not present

## 2018-12-28 DIAGNOSIS — F0150 Vascular dementia without behavioral disturbance: Secondary | ICD-10-CM | POA: Insufficient documentation

## 2018-12-28 LAB — CBC WITH DIFFERENTIAL/PLATELET
Abs Immature Granulocytes: 0.01 10*3/uL (ref 0.00–0.07)
Basophils Absolute: 0 10*3/uL (ref 0.0–0.1)
Basophils Relative: 0 %
Eosinophils Absolute: 0.2 10*3/uL (ref 0.0–0.5)
Eosinophils Relative: 2 %
HCT: 36.1 % — ABNORMAL LOW (ref 39.0–52.0)
Hemoglobin: 12 g/dL — ABNORMAL LOW (ref 13.0–17.0)
Immature Granulocytes: 0 %
Lymphocytes Relative: 35 %
Lymphs Abs: 2.5 10*3/uL (ref 0.7–4.0)
MCH: 32.2 pg (ref 26.0–34.0)
MCHC: 33.2 g/dL (ref 30.0–36.0)
MCV: 96.8 fL (ref 80.0–100.0)
Monocytes Absolute: 0.5 10*3/uL (ref 0.1–1.0)
Monocytes Relative: 7 %
Neutro Abs: 3.9 10*3/uL (ref 1.7–7.7)
Neutrophils Relative %: 56 %
Platelets: 322 10*3/uL (ref 150–400)
RBC: 3.73 MIL/uL — ABNORMAL LOW (ref 4.22–5.81)
RDW: 12.5 % (ref 11.5–15.5)
WBC: 7.1 10*3/uL (ref 4.0–10.5)
nRBC: 0 % (ref 0.0–0.2)

## 2018-12-28 LAB — COMPREHENSIVE METABOLIC PANEL
ALT: 14 U/L (ref 0–44)
AST: 22 U/L (ref 15–41)
Albumin: 4.3 g/dL (ref 3.5–5.0)
Alkaline Phosphatase: 93 U/L (ref 38–126)
Anion gap: 11 (ref 5–15)
BUN: 22 mg/dL (ref 8–23)
CO2: 23 mmol/L (ref 22–32)
Calcium: 9 mg/dL (ref 8.9–10.3)
Chloride: 93 mmol/L — ABNORMAL LOW (ref 98–111)
Creatinine, Ser: 1.25 mg/dL — ABNORMAL HIGH (ref 0.61–1.24)
GFR calc Af Amer: >60 mL/min (ref >60)
GFR calc non Af Amer: 54 mL/min — ABNORMAL LOW (ref >60)
Glucose, Bld: 104 mg/dL — ABNORMAL HIGH (ref 70–99)
Potassium: 4.6 mmol/L (ref 3.5–5.1)
Sodium: 127 mmol/L — ABNORMAL LOW (ref 135–145)
Total Bilirubin: 0.4 mg/dL (ref 0.3–1.2)
Total Protein: 7.5 g/dL (ref 6.5–8.1)

## 2018-12-28 LAB — URINALYSIS, ROUTINE W REFLEX MICROSCOPIC
Bilirubin Urine: NEGATIVE
Glucose, UA: NEGATIVE mg/dL
Hgb urine dipstick: NEGATIVE
Ketones, ur: NEGATIVE mg/dL
Leukocytes,Ua: NEGATIVE
Nitrite: NEGATIVE
Protein, ur: NEGATIVE mg/dL
Specific Gravity, Urine: 1.008 (ref 1.005–1.030)
pH: 7 (ref 5.0–8.0)

## 2018-12-28 LAB — CBG MONITORING, ED: Glucose-Capillary: 134 mg/dL — ABNORMAL HIGH (ref 70–99)

## 2018-12-28 NOTE — ED Notes (Signed)
ASSESSMENT: pt alert and able to answer questions, requesting to watch TV since he did not bring anything to read or do. He knows where he is and his name. No c/o pain or distress voiced.

## 2018-12-28 NOTE — ED Triage Notes (Addendum)
Pt told to come to the ED by home health nurse. Pt states the nurses told him he appeared confused but pt denies feeling confused. Denies new symptoms, denies pain / weakness. Pt alert and oriented x 4.

## 2018-12-28 NOTE — ED Notes (Signed)
Pt aware we need a urine sample, states he doesn't have any in him now.

## 2018-12-28 NOTE — Discharge Instructions (Addendum)
Follow up with your md next week. °

## 2018-12-28 NOTE — ED Provider Notes (Signed)
[place] DEPT Provider Note   CSN: [id number] Arrival date & time: 12/28/18  1231     History   Chief Complaint Chief Complaint  Patient presents with  . told to come to ED by home health nurse, no complaints    HPI Willie [name] is a 78 y.o. male.     Pt sent from home for mild confusion,  Pt has dementia  The history is provided by the patient and the nursing home. No language interpreter was used.  Altered Mental Status Presenting symptoms: behavior changes   Severity:  Mild Most recent episode:  Today Episode history:  Multiple Timing:  Constant Progression:  Improving Chronicity:  Recurrent Context: dementia   Context: not alcohol use   Associated symptoms: no abdominal pain, no hallucinations, no headaches, no rash and no seizures     Past Medical History:  Diagnosis Date  . Alcohol abuse   . Benign prostatic hypertrophy   . Dementia [facility])    daughter-in-law does not know of this   . Diabetes mellitus   . Encephalopathy   . GERD (gastroesophageal reflux disease)   . History of tarsorrhaphy    right eye  . Hypertension   . Hypothyroidism   . Pneumonia   . Stroke [facility])    right eye does not shut all the way-tarsorrhaphy    There are no active problems to display for this patient.   Past Surgical History:  Procedure Laterality Date  . COLONOSCOPY WITH PROPOFOL N/A 08/24/2014   Procedure: COLONOSCOPY WITH PROPOFOL;  Surgeon: Garlan Fair, MD;  Location: [facility];  Service: Endoscopy;  Laterality: N/A;  . EYE SURGERY     Laser eye surgery        Home Medications    Prior to Admission medications   Medication Sig Start Date End Date Taking? Authorizing Provider  amLODipine (NORVASC) 5 MG tablet Take 5 mg by mouth every morning.    [provider]  Artificial Tear Ointment (LUBRICANT EYE) OINT Place 1 application into the right eye at bedtime. Attempt to tape right eye closed after administering  lubricant. If unable to tape closed. Use eye patch.    [provider]  atorvastatin (LIPITOR) 10 MG tablet Take 10 mg by mouth every evening.    [provider]  atorvastatin (LIPITOR) 20 MG tablet Take 20 mg by mouth at bedtime.    [provider]  azithromycin (ZITHROMAX) 250 MG tablet Take 1 tablet (250 mg total) by mouth daily. Take first 2 tablets together, then 1 every day until finished. Patient not taking: Reported on 12/11/2018 07/16/16   Bjorn Pippin, PA-C  finasteride (PROSCAR) 5 MG tablet Take 5 mg by mouth every morning.    [provider]  glipiZIDE (GLUCOTROL XL) 2.5 MG 24 hr tablet Take 2.5 mg by mouth daily with breakfast.     [provider]  levothyroxine (SYNTHROID) 137 MCG tablet Take 137 mcg by mouth daily before breakfast.    [provider]  Memantine HCl-Donepezil HCl (NAMZARIC) 28-10 MG CP24 Take 1 capsule by mouth daily.    [provider]  metFORMIN (GLUCOPHAGE) 500 MG tablet Take 250 mg by mouth 2 (two) times daily with a meal.     [provider]  metoprolol succinate (TOPROL-XL) 50 MG 24 hr tablet Take 50 mg by mouth every morning. Take with or immediately following a meal.    [provider]  mirabegron ER (MYRBETRIQ) 50  MG TB24 tablet Take 50 mg by mouth every evening.    [provider]  Multiple Vitamins-Minerals (MULTIVITAMIN ADULTS) TABS Take 1 tablet by mouth daily.    [provider]  sennosides-docusate sodium (SENOKOT-S) 8.6-50 MG tablet Take 1 tablet by mouth every morning.    [provider]  sodium chloride 1 g tablet Take 1 g by mouth daily.    [provider]  solifenacin (VESICARE) 10 MG tablet Take 10 mg by mouth every evening.     [provider]  valsartan (DIOVAN) 80 MG tablet Take 80 mg by mouth every evening.    [provider]  Vitamin E 400 units TABS Take 1 tablet by mouth daily.    [provider]     Family History No family history on file.  Social History Social History   Tobacco Use  . Smoking status: Former Smoker  Substance Use Topics  . Alcohol use: No    Comment: Hx of etoh abuse  . Drug use: No     Allergies   Aspirin   Review of Systems Review of Systems  Constitutional: Negative for appetite change and fatigue.  HENT: Negative for congestion, ear discharge and sinus pressure.   Eyes: Negative for discharge.  Respiratory: Negative for cough.   Cardiovascular: Negative for chest pain.  Gastrointestinal: Negative for abdominal pain and diarrhea.  Genitourinary: Negative for frequency and hematuria.  Musculoskeletal: Negative for back pain.  Skin: Negative for rash.  Neurological: Negative for seizures and headaches.  Psychiatric/Behavioral: Negative for hallucinations.     Physical Exam Updated Vital Signs BP (!) 149/96 (BP Location: Left Arm)   Pulse 64   Temp 98.2 F (36.8 C) (Oral)   Resp 18   SpO2 100%   Physical Exam Vitals signs and nursing note reviewed.  Constitutional:      Appearance: He is well-developed.  HENT:     Head: Normocephalic.     Nose: Nose normal.  Eyes:     General: No scleral icterus.    Conjunctiva/sclera: Conjunctivae normal.  Neck:     Musculoskeletal: Neck supple.     Thyroid: No thyromegaly.  Cardiovascular:     Rate and Rhythm: Normal rate and regular rhythm.     Heart sounds: No murmur. No friction rub. No gallop.   Pulmonary:     Breath sounds: No stridor. No wheezing or rales.  Chest:     Chest wall: No tenderness.  Abdominal:     General: There is no distension.     Tenderness: There is no abdominal tenderness. There is no rebound.  Musculoskeletal: Normal range of motion.  Lymphadenopathy:     Cervical: No cervical adenopathy.  Skin:    Findings: No erythema or rash.  Neurological:     Mental Status: He is alert and oriented to person, place, and time.     Motor: No abnormal muscle tone.      Coordination: Coordination normal.  Psychiatric:        Behavior: Behavior normal.      ED Treatments / Results  Labs (all labs ordered are listed, but only abnormal results are displayed) Labs Reviewed  CBC WITH DIFFERENTIAL/PLATELET - Abnormal; Notable for the following components:      Result Value   RBC 3.73 (*)    Hemoglobin 12.0 (*)    HCT 36.1 (*)    All other components within normal limits  COMPREHENSIVE METABOLIC PANEL - Abnormal; Notable for the following components:  Sodium 127 (*)    Chloride 93 (*)    Glucose, Bld 104 (*)    Creatinine, Ser 1.25 (*)    GFR calc non Af Amer 54 (*)    All other components within normal limits  URINALYSIS, ROUTINE W REFLEX MICROSCOPIC - Abnormal; Notable for the following components:   Color, Urine STRAW (*)    All other components within normal limits  CBG MONITORING, ED - Abnormal; Notable for the following components:   Glucose-Capillary 134 (*)    All other components within normal limits    EKG None  Radiology Ct Head Wo Contrast  Result Date: 12/28/2018 CLINICAL DATA:  Confusion/altered level of consciousness. EXAM: CT HEAD WITHOUT CONTRAST TECHNIQUE: Contiguous axial images were obtained from the base of the skull through the vertex without intravenous contrast. COMPARISON:  December 11, 2018 FINDINGS: Brain: Small remote lacunar infarct in the left thalamus, image 15/2, no change from prior. Otherwise, the brainstem, cerebellum, cerebral peduncles, thalamus, basal ganglia, basilar cisterns, and ventricular system appear within normal limits. Periventricular white matter and corona radiata hypodensities favor chronic ischemic microvascular white matter disease. No intracranial hemorrhage, mass lesion, or acute CVA. Vascular: There is atherosclerotic calcification of the cavernous carotid arteries bilaterally. Skull: Unremarkable Sinuses/Orbits: Unremarkable Other: No supplemental non-categorized findings. IMPRESSION: 1. No acute  intracranial findings. 2. Periventricular white matter and corona radiata hypodensities favor chronic ischemic microvascular white matter disease. 3. 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 Electronically Signed   By: Van Clines M.D.   On: 12/28/2018 13:39    Procedures Procedures (including critical care time)  Medications Ordered in ED Medications - No data to display   Initial Impression / Assessment and Plan / ED Course  I have reviewed the triage vital signs and the nursing notes.  Pertinent labs & imaging results that were available during my care of the patient were reviewed by me and considered in my medical decision making (see chart for details).        Labs unremarkable,  Ct nad,  Pt with dementia,  Not significantly worse,  Will discharge home for follow up with pcp  Final Clinical Impressions(s) / ED Diagnoses   Final diagnoses:  Vascular dementia without behavioral disturbance [facility])    ED Discharge Orders    None       Milton Ferguson, MD 12/28/18 1558

## 2018-12-28 NOTE — ED Notes (Signed)
Pt has been informed that urine specimen is needed / urinal at bedside

## 2018-12-31 ENCOUNTER — Encounter (HOSPITAL_COMMUNITY): Payer: Self-pay

## 2018-12-31 ENCOUNTER — Observation Stay (HOSPITAL_COMMUNITY)
Admission: EM | Admit: 2018-12-31 | Discharge: 2019-01-02 | Disposition: A | Discharge status: Home Health | Payer: Medicare Other | Attending: Family Medicine | Admitting: Family Medicine

## 2018-12-31 ENCOUNTER — Other Ambulatory Visit: Payer: Self-pay

## 2018-12-31 DIAGNOSIS — Z79890 Hormone replacement therapy (postmenopausal): Secondary | ICD-10-CM | POA: Diagnosis not present

## 2018-12-31 DIAGNOSIS — E162 Hypoglycemia, unspecified: Secondary | ICD-10-CM

## 2018-12-31 DIAGNOSIS — F0150 Vascular dementia without behavioral disturbance: Secondary | ICD-10-CM | POA: Insufficient documentation

## 2018-12-31 DIAGNOSIS — N400 Enlarged prostate without lower urinary tract symptoms: Secondary | ICD-10-CM | POA: Insufficient documentation

## 2018-12-31 DIAGNOSIS — N189 Chronic kidney disease, unspecified: Secondary | ICD-10-CM

## 2018-12-31 DIAGNOSIS — Z7984 Long term (current) use of oral hypoglycemic drugs: Secondary | ICD-10-CM | POA: Diagnosis not present

## 2018-12-31 DIAGNOSIS — R634 Abnormal weight loss: Secondary | ICD-10-CM | POA: Insufficient documentation

## 2018-12-31 DIAGNOSIS — Z1159 Encounter for screening for other viral diseases: Secondary | ICD-10-CM | POA: Diagnosis not present

## 2018-12-31 DIAGNOSIS — Z79899 Other long term (current) drug therapy: Secondary | ICD-10-CM | POA: Insufficient documentation

## 2018-12-31 DIAGNOSIS — E871 Hypo-osmolality and hyponatremia: Secondary | ICD-10-CM | POA: Diagnosis not present

## 2018-12-31 DIAGNOSIS — G9341 Metabolic encephalopathy: Secondary | ICD-10-CM | POA: Diagnosis not present

## 2018-12-31 DIAGNOSIS — E1122 Type 2 diabetes mellitus with diabetic chronic kidney disease: Secondary | ICD-10-CM | POA: Insufficient documentation

## 2018-12-31 DIAGNOSIS — E11649 Type 2 diabetes mellitus with hypoglycemia without coma: Secondary | ICD-10-CM | POA: Diagnosis not present

## 2018-12-31 DIAGNOSIS — I129 Hypertensive chronic kidney disease with stage 1 through stage 4 chronic kidney disease, or unspecified chronic kidney disease: Secondary | ICD-10-CM | POA: Insufficient documentation

## 2018-12-31 DIAGNOSIS — K219 Gastro-esophageal reflux disease without esophagitis: Secondary | ICD-10-CM | POA: Diagnosis not present

## 2018-12-31 DIAGNOSIS — E039 Hypothyroidism, unspecified: Secondary | ICD-10-CM | POA: Insufficient documentation

## 2018-12-31 DIAGNOSIS — D649 Anemia, unspecified: Secondary | ICD-10-CM | POA: Diagnosis not present

## 2018-12-31 DIAGNOSIS — E785 Hyperlipidemia, unspecified: Secondary | ICD-10-CM | POA: Diagnosis not present

## 2018-12-31 DIAGNOSIS — R531 Weakness: Secondary | ICD-10-CM

## 2018-12-31 DIAGNOSIS — R4182 Altered mental status, unspecified: Secondary | ICD-10-CM

## 2018-12-31 DIAGNOSIS — N179 Acute kidney failure, unspecified: Secondary | ICD-10-CM

## 2018-12-31 DIAGNOSIS — I69351 Hemiplegia and hemiparesis following cerebral infarction affecting right dominant side: Secondary | ICD-10-CM | POA: Diagnosis not present

## 2018-12-31 DIAGNOSIS — N182 Chronic kidney disease, stage 2 (mild): Secondary | ICD-10-CM | POA: Diagnosis not present

## 2018-12-31 DIAGNOSIS — I10 Essential (primary) hypertension: Secondary | ICD-10-CM | POA: Diagnosis present

## 2018-12-31 LAB — URINALYSIS, ROUTINE W REFLEX MICROSCOPIC
Bilirubin Urine: NEGATIVE
Glucose, UA: NEGATIVE mg/dL
Hgb urine dipstick: NEGATIVE
Ketones, ur: NEGATIVE mg/dL
Leukocytes,Ua: NEGATIVE
Nitrite: NEGATIVE
Protein, ur: NEGATIVE mg/dL
Specific Gravity, Urine: 1.008 (ref 1.005–1.030)
pH: 7 (ref 5.0–8.0)

## 2018-12-31 LAB — CBC
HCT: 32.5 % — ABNORMAL LOW (ref 39.0–52.0)
Hemoglobin: 10.9 g/dL — ABNORMAL LOW (ref 13.0–17.0)
MCH: 32.1 pg (ref 26.0–34.0)
MCHC: 33.5 g/dL (ref 30.0–36.0)
MCV: 95.6 fL (ref 80.0–100.0)
Platelets: 296 10*3/uL (ref 150–400)
RBC: 3.4 MIL/uL — ABNORMAL LOW (ref 4.22–5.81)
RDW: 12.2 % (ref 11.5–15.5)
WBC: 5.5 10*3/uL (ref 4.0–10.5)
nRBC: 0 % (ref 0.0–0.2)

## 2018-12-31 LAB — CBG MONITORING, ED
Glucose-Capillary: 118 mg/dL — ABNORMAL HIGH (ref 70–99)
Glucose-Capillary: 80 mg/dL (ref 70–99)
Glucose-Capillary: 81 mg/dL (ref 70–99)

## 2018-12-31 LAB — BASIC METABOLIC PANEL
Anion gap: 10 (ref 5–15)
Anion gap: 8 (ref 5–15)
BUN: 16 mg/dL (ref 8–23)
BUN: 14 mg/dL (ref 8–23)
CO2: 20 mmol/L — ABNORMAL LOW (ref 22–32)
CO2: 25 mmol/L (ref 22–32)
Calcium: 8.9 mg/dL (ref 8.9–10.3)
Calcium: 9.2 mg/dL (ref 8.9–10.3)
Chloride: 97 mmol/L — ABNORMAL LOW (ref 98–111)
Chloride: 97 mmol/L — ABNORMAL LOW (ref 98–111)
Creatinine, Ser: 1.14 mg/dL (ref 0.61–1.24)
Creatinine, Ser: 1.04 mg/dL (ref 0.61–1.24)
GFR calc Af Amer: >60 mL/min (ref >60)
GFR calc Af Amer: >60 mL/min (ref >60)
GFR calc non Af Amer: >60 mL/min (ref >60)
GFR calc non Af Amer: >60 mL/min (ref >60)
Glucose, Bld: 74 mg/dL (ref 70–99)
Glucose, Bld: 144 mg/dL — ABNORMAL HIGH (ref 70–99)
Potassium: 4.6 mmol/L (ref 3.5–5.1)
Potassium: 5.1 mmol/L (ref 3.5–5.1)
Sodium: 127 mmol/L — ABNORMAL LOW (ref 135–145)
Sodium: 130 mmol/L — ABNORMAL LOW (ref 135–145)

## 2018-12-31 LAB — COMPREHENSIVE METABOLIC PANEL
ALT: 11 U/L (ref 0–44)
AST: 19 U/L (ref 15–41)
Albumin: 3.3 g/dL — ABNORMAL LOW (ref 3.5–5.0)
Alkaline Phosphatase: 66 U/L (ref 38–126)
Anion gap: 9 (ref 5–15)
BUN: 17 mg/dL (ref 8–23)
CO2: 22 mmol/L (ref 22–32)
Calcium: 8.9 mg/dL (ref 8.9–10.3)
Chloride: 94 mmol/L — ABNORMAL LOW (ref 98–111)
Creatinine, Ser: 1.26 mg/dL — ABNORMAL HIGH (ref 0.61–1.24)
GFR calc Af Amer: >60 mL/min (ref >60)
GFR calc non Af Amer: 54 mL/min — ABNORMAL LOW (ref >60)
Glucose, Bld: 123 mg/dL — ABNORMAL HIGH (ref 70–99)
Potassium: 4.8 mmol/L (ref 3.5–5.1)
Sodium: 125 mmol/L — ABNORMAL LOW (ref 135–145)
Total Bilirubin: 0.5 mg/dL (ref 0.3–1.2)
Total Protein: 6.2 g/dL — ABNORMAL LOW (ref 6.5–8.1)

## 2018-12-31 LAB — SARS CORONAVIRUS 2 BY RT PCR (HOSPITAL ORDER, PERFORMED IN ~~LOC~~ HOSPITAL LAB): SARS Coronavirus 2: NEGATIVE

## 2018-12-31 LAB — GLUCOSE, CAPILLARY
Glucose-Capillary: 92 mg/dL (ref 70–99)
Glucose-Capillary: 125 mg/dL — ABNORMAL HIGH (ref 70–99)

## 2018-12-31 LAB — SODIUM, URINE, RANDOM: Sodium, Ur: 63 mmol/L

## 2018-12-31 LAB — TSH: TSH: 1.588 u[IU]/mL (ref 0.350–4.500)

## 2018-12-31 LAB — OSMOLALITY, URINE: Osmolality, Ur: 183 mOsm/kg — ABNORMAL LOW (ref 300–900)

## 2018-12-31 LAB — T4, FREE: Free T4: 1.21 ng/dL — ABNORMAL HIGH (ref 0.61–1.12)

## 2018-12-31 MED ORDER — SODIUM CHLORIDE 0.9 % IV BOLUS
1000.0000 mL | Freq: Once | INTRAVENOUS | Status: AC
  Administered 2018-12-31: 1000 mL via INTRAVENOUS

## 2018-12-31 MED ORDER — ARTIFICIAL TEARS OPHTHALMIC OINT
1.0000 "application " | TOPICAL_OINTMENT | Freq: Every day | OPHTHALMIC | Status: DC
  Administered 2018-12-31 – 2019-01-01 (×2): 1 via OPHTHALMIC
  Filled 2018-12-31: qty 3.5

## 2018-12-31 MED ORDER — SODIUM CHLORIDE 0.9% FLUSH: 3.0000 mL | Freq: Two times a day (BID) | INTRAVENOUS | Status: DC

## 2018-12-31 MED ORDER — AMLODIPINE BESYLATE 5 MG PO TABS
5.0000 mg | ORAL_TABLET | Freq: Every day | ORAL | Status: DC
  Administered 2019-01-01 – 2019-01-02 (×2): 5 mg via ORAL
  Filled 2018-12-31 (×2): qty 1

## 2018-12-31 MED ORDER — LEVOTHYROXINE SODIUM 25 MCG PO TABS
137.0000 ug | ORAL_TABLET | Freq: Every day | ORAL | Status: DC
  Administered 2019-01-01 – 2019-01-02 (×2): 137 ug via ORAL
  Filled 2018-12-31 (×2): qty 1

## 2018-12-31 MED ORDER — DEXTROSE-NACL 5-0.45 % IV SOLN
INTRAVENOUS | Status: DC
  Administered 2018-12-31: 17:00:00 via INTRAVENOUS

## 2018-12-31 MED ORDER — SENNOSIDES-DOCUSATE SODIUM 8.6-50 MG PO TABS
1.0000 | ORAL_TABLET | Freq: Every day | ORAL | Status: DC
  Administered 2019-01-01 – 2019-01-02 (×2): 1 via ORAL
  Filled 2018-12-31 (×2): qty 1

## 2018-12-31 MED ORDER — MIRABEGRON ER 50 MG PO TB24
50.0000 mg | ORAL_TABLET | Freq: Every evening | ORAL | Status: DC
  Administered 2018-12-31 – 2019-01-01 (×2): 50 mg via ORAL
  Filled 2018-12-31 (×3): qty 1

## 2018-12-31 MED ORDER — MEMANTINE HCL ER 28 MG PO CP24
28.0000 mg | ORAL_CAPSULE | Freq: Every day | ORAL | Status: DC
  Administered 2019-01-01 – 2019-01-02 (×2): 28 mg via ORAL
  Filled 2018-12-31 (×2): qty 1

## 2018-12-31 MED ORDER — SODIUM CHLORIDE 1 G PO TABS
1.0000 g | ORAL_TABLET | Freq: Every day | ORAL | Status: DC
  Administered 2019-01-01 – 2019-01-02 (×2): 1 g via ORAL
  Filled 2018-12-31 (×2): qty 1

## 2018-12-31 MED ORDER — ONDANSETRON HCL 4 MG/2ML IJ SOLN: 4.0000 mg | Freq: Four times a day (QID) | INTRAMUSCULAR | Status: DC | PRN

## 2018-12-31 MED ORDER — ENOXAPARIN SODIUM 40 MG/0.4ML ~~LOC~~ SOLN
40.0000 mg | SUBCUTANEOUS | Status: DC
  Administered 2018-12-31 – 2019-01-01 (×2): 40 mg via SUBCUTANEOUS
  Filled 2018-12-31 (×2): qty 0.4

## 2018-12-31 MED ORDER — ACETAMINOPHEN 325 MG PO TABS: 650.0000 mg | ORAL_TABLET | Freq: Four times a day (QID) | ORAL | Status: DC | PRN

## 2018-12-31 MED ORDER — IRBESARTAN 75 MG PO TABS
75.0000 mg | ORAL_TABLET | Freq: Every day | ORAL | Status: DC
  Administered 2018-12-31 – 2019-01-01 (×2): 75 mg via ORAL
  Filled 2018-12-31 (×3): qty 1

## 2018-12-31 MED ORDER — METOPROLOL TARTRATE 50 MG PO TABS
50.0000 mg | ORAL_TABLET | Freq: Every day | ORAL | Status: DC
  Administered 2019-01-01 – 2019-01-02 (×2): 50 mg via ORAL
  Filled 2018-12-31 (×2): qty 1

## 2018-12-31 MED ORDER — POLYVINYL ALCOHOL 1.4 % OP SOLN
1.0000 [drp] | OPHTHALMIC | Status: DC | PRN
  Administered 2019-01-01: 1 [drp] via OPHTHALMIC
  Filled 2018-12-31: qty 15

## 2018-12-31 MED ORDER — FINASTERIDE 5 MG PO TABS
5.0000 mg | ORAL_TABLET | Freq: Every morning | ORAL | Status: DC
  Administered 2019-01-01 – 2019-01-02 (×2): 5 mg via ORAL
  Filled 2018-12-31 (×2): qty 1

## 2018-12-31 MED ORDER — ACETAMINOPHEN 650 MG RE SUPP: 650.0000 mg | Freq: Four times a day (QID) | RECTAL | Status: DC | PRN

## 2018-12-31 MED ORDER — MEMANTINE HCL-DONEPEZIL HCL ER 28-10 MG PO CP24: 1.0000 | ORAL_CAPSULE | Freq: Every day | ORAL | Status: DC

## 2018-12-31 MED ORDER — ATORVASTATIN CALCIUM 10 MG PO TABS
10.0000 mg | ORAL_TABLET | Freq: Every evening | ORAL | Status: DC
  Administered 2018-12-31 – 2019-01-01 (×2): 10 mg via ORAL
  Filled 2018-12-31 (×3): qty 1

## 2018-12-31 MED ORDER — ONDANSETRON HCL 4 MG PO TABS: 4.0000 mg | ORAL_TABLET | Freq: Four times a day (QID) | ORAL | Status: DC | PRN

## 2018-12-31 MED ORDER — DONEPEZIL HCL 10 MG PO TABS
10.0000 mg | ORAL_TABLET | Freq: Every day | ORAL | Status: DC
  Administered 2019-01-01 – 2019-01-02 (×2): 10 mg via ORAL
  Filled 2018-12-31 (×2): qty 1

## 2018-12-31 NOTE — H&P (Addendum)
History and Physical    Willie [name] YQI:[id number] DOB: May 20, 1941 DOA: 12/31/2018  Referring MD/NP/PA: Frederich Balding, MD PCP: Josetta Huddle, MD  Patient coming from: Independent living [facility] via EMS  Chief Complaint: Altered mental status  I have personally briefly reviewed patient's old medical records in [place]   HPI: Willie [name] is a 78 y.o. male with medical history significant of vascular dementia, CVA with residual right-sided weakness, diabetes mellitus type 2, and hypothyroidism; who presents after found him to be confused and weak. He has been in the independent living section of [name] and has been doing well.  Patient reports utilizing a walker to ambulate.  He denies having any formal complaints at this time.  Initial blood sugar noted to be 56 for which patient was given orange juice with EMS with repeat 69.  Patient was thereafter given 12.5 g of D50 in route. Thyroid medication just recently lowered from 150 mcg to 135 mcg. He states that [name] is killing him.   Daughter-in-law states he has last 30 pounds over the last few weeks.  She reports that he exercises, but she is concerned that he is forgetting to eat.  She is concerned as he has been impulsive and has been looking for apartments nearby that are not necessarily safe.  He even hired movers to move his stuff to his mother's house which is not inhabitable.  ED Course: Upon admission into the emergency department patient was noted to be afebrile, pulse 59-65, and all other vital signs maintained.  Labs revealed WBC 5.5, hemoglobin 10.9, sodium 125, chloride 94, BUN 17, creatinine 1.26, and free T4 1.21.  Urinalysis was negative for any acute signs of infection.  Review of Systems  Unable to perform ROS: Dementia  Constitutional: Positive for malaise/fatigue.  Cardiovascular: Negative for chest pain and leg swelling.  Gastrointestinal: Negative for abdominal pain and vomiting.   Musculoskeletal: Negative for falls.  Neurological: Positive for weakness. Negative for loss of consciousness.  Psychiatric/Behavioral: Positive for memory loss. Negative for substance abuse.  All other systems reviewed and are negative.   Past Medical History:  Diagnosis Date  . Alcohol abuse   . Benign prostatic hypertrophy   . Dementia [facility])    daughter-in-law does not know of this   . Diabetes mellitus   . Encephalopathy   . GERD (gastroesophageal reflux disease)   . History of tarsorrhaphy    right eye  . Hypertension   . Hypothyroidism   . Pneumonia   . Stroke [facility])    right eye does not shut all the way-tarsorrhaphy    Past Surgical History:  Procedure Laterality Date  . COLONOSCOPY WITH PROPOFOL N/A 08/24/2014   Procedure: COLONOSCOPY WITH PROPOFOL;  Surgeon: Garlan Fair, MD;  Location: [facility];  Service: Endoscopy;  Laterality: N/A;  . EYE SURGERY     Laser eye surgery     reports that he has never smoked. He has never used smokeless tobacco. He reports current alcohol use of about 1.0 standard drinks of alcohol per week. He reports that he does not use drugs.  Allergies  Allergen Reactions  . Aspirin Other (See Comments)    Had stroke in March last year. No more aspirin.    No significant family history reported.  Prior to Admission medications   Medication Sig Start Date End Date Taking? Authorizing Provider  amLODipine (NORVASC) 5 MG tablet Take 5 mg by mouth every morning.   Yes  [provider]  Artificial Tear Ointment (LUBRICANT EYE) OINT Place 1 application into the right eye at bedtime. Attempt to tape right eye closed after administering lubricant. If unable to tape closed. Use eye patch.   Yes [provider]  atorvastatin (LIPITOR) 10 MG tablet Take 10 mg by mouth every evening.   Yes [provider]  finasteride (PROSCAR) 5 MG tablet Take 5 mg by mouth every morning.   Yes [provider]  glipiZIDE  (GLUCOTROL) 5 MG tablet Take 2.5 mg by mouth daily. 12/16/18  Yes [provider]  levothyroxine (SYNTHROID) 137 MCG tablet Take 137 mcg by mouth daily before breakfast.   Yes [provider]  Memantine HCl-Donepezil HCl (NAMZARIC) 28-10 MG CP24 Take 1 capsule by mouth daily.   Yes [provider]  metFORMIN (GLUCOPHAGE) 500 MG tablet Take 250 mg by mouth 2 (two) times daily with a meal.    Yes [provider]  metoprolol tartrate (LOPRESSOR) 50 MG tablet Take 50 mg by mouth daily.  11/25/18  Yes [provider]  mirabegron ER (MYRBETRIQ) 50 MG TB24 tablet Take 50 mg by mouth every evening.   Yes [provider]  Multiple Vitamins-Minerals (MULTIVITAMIN ADULTS) TABS Take 1 tablet by mouth daily.   Yes [provider]  sennosides-docusate sodium (SENOKOT-S) 8.6-50 MG tablet Take 1 tablet by mouth every morning.   Yes [provider]  sodium chloride 1 g tablet Take 1 g by mouth daily.   Yes [provider]  solifenacin (VESICARE) 10 MG tablet Take 10 mg by mouth every evening.    Yes [provider]  valsartan (DIOVAN) 80 MG tablet Take 80 mg by mouth every evening.   Yes [provider]  Vitamin E 400 units TABS Take 1 tablet by mouth daily.   Yes [provider]    Physical Exam:  Constitutional: Elderly male who appears to be in no acute distress at this time. Vitals:   12/31/18 1152 12/31/18 1153 12/31/18 1230 12/31/18 1315  BP: 139/60  (!) 111/59 (!) 157/77  Pulse: 62  (!) 59 65  Resp: 17  14 11   Temp: (!) 97.5 F (36.4 C)     TempSrc: Oral     SpO2: 100%  99% 96%  Weight:  72.6 kg    Height:  6' (1.829 m)     Eyes: PERRL, lids and conjunctivae normal ENMT: Mucous membranes are dry.  Posterior pharynx clear of any exudate or lesions. .  Neck: normal, supple, no masses, no thyromegaly Respiratory: clear to auscultation bilaterally, no wheezing, no crackles. Normal respiratory  effort. No accessory muscle use.  Cardiovascular: Regular rate and rhythm, no murmurs / rubs / gallops. No extremity edema. 2+ pedal pulses. No carotid bruits.  Abdomen: no tenderness, no masses palpated. No hepatosplenomegaly. Bowel sounds positive.  Musculoskeletal: no clubbing / cyanosis. No joint deformity upper and lower extremities. Good ROM, no contractures. Normal muscle tone.  Skin: no rashes, lesions, ulcers. No induration Neurologic: CN 2-12 grossly intact. Sensation intact, DTR normal.  Strength mildly weak on the right when compared to the left. Psychiatric: Recent memory is abnormal.  Appears alert and oriented x 3 at this time. Normal mood.     Labs on Admission: I have personally reviewed following labs and imaging studies  CBC: Recent Labs  Lab 12/28/18 1254 12/31/18 1205  WBC 7.1 5.5  NEUTROABS 3.9  --   HGB 12.0* 10.9*  HCT 36.1* 32.5*  MCV  96.8 95.6  PLT 322 093   Basic Metabolic Panel: Recent Labs  Lab 12/28/18 1254 12/31/18 1205  NA 127* 125*  K 4.6 4.8  CL 93* 94*  CO2 23 22  GLUCOSE 104* 123*  BUN 22 17  CREATININE 1.25* 1.26*  CALCIUM 9.0 8.9   GFR: Estimated Creatinine Clearance: 48 mL/min (A) (by C-G formula based on SCr of 1.26 mg/dL (H)). Liver Function Tests: Recent Labs  Lab 12/28/18 1254 12/31/18 1205  AST 22 19  ALT 14 11  ALKPHOS 93 66  BILITOT 0.4 0.5  PROT 7.5 6.2*  ALBUMIN 4.3 3.3*   No results for input(s): LIPASE, AMYLASE in the last 168 hours. No results for input(s): AMMONIA in the last 168 hours. Coagulation Profile: No results for input(s): INR, PROTIME in the last 168 hours. Cardiac Enzymes: No results for input(s): CKTOTAL, CKMB, CKMBINDEX, TROPONINI in the last 168 hours. BNP (last 3 results) No results for input(s): PROBNP in the last 8760 hours. HbA1C: No results for input(s): HGBA1C in the last 72 hours. CBG: Recent Labs  Lab 12/28/18 1255 12/31/18 1147 12/31/18 1248  GLUCAP 134* 118* 80   Lipid  Profile: No results for input(s): CHOL, HDL, LDLCALC, TRIG, CHOLHDL, LDLDIRECT in the last 72 hours. Thyroid Function Tests: Recent Labs    12/31/18 1205  FREET4 1.21*   Anemia Panel: No results for input(s): VITAMINB12, FOLATE, FERRITIN, TIBC, IRON, RETICCTPCT in the last 72 hours. Urine analysis:    Component Value Date/Time   COLORURINE YELLOW 12/31/2018 1310   APPEARANCEUR CLEAR 12/31/2018 1310   LABSPEC 1.008 12/31/2018 1310   PHURINE 7.0 12/31/2018 1310   GLUCOSEU NEGATIVE 12/31/2018 1310   HGBUR NEGATIVE 12/31/2018 1310   BILIRUBINUR NEGATIVE 12/31/2018 1310   KETONESUR NEGATIVE 12/31/2018 1310   PROTEINUR NEGATIVE 12/31/2018 1310   UROBILINOGEN 0.2 06/06/2011 1803   NITRITE NEGATIVE 12/31/2018 1310   LEUKOCYTESUR NEGATIVE 12/31/2018 1310   Sepsis Labs: No results found for this or any previous visit (from the past 240 hour(s)).   Radiological Examson  Admission: No results found.  EKG: Independently reviewed.  62 bpm with prolonged PR interval.  Assessment/Plan Acute metabolic encephalopathy: Resolving.  Patient was confused and found to be hypoglycemic and hyponatremia.  Patient CT scan of the brain showed no acute abnormalities.  Patient appears more alert and oriented after correction of blood sugars.  Low sodium levels may also be contributing. -Admit to medical telemetry bed -Neurochecks -Social work consult for need placement in a different assisted living facility  Hyponatremia: Acute on chronic.  Sodium 125 on admission which appears lower than previous.  Patient already on salt tablets. -Continue salt tablets 1 g daily -Check urine osmolarity and urine sodium -BMPs every 6 hours x3 -D5-0.45% normal saline at 75 mL/h -Goal correction no more than 10 per 24 hours -adjust fluids as needed   Diabetes mellitus type 2 with hypoglycemia: Acute.  Patient's initial blood glucose noted to be around 50 when EMS arrived.  Patient was given dextrose in route with  improvement of blood glucose.  However, appears to be trending downward.  He is on metformin and glipizide.  Glipizide is the most likely cause of agent. -Hypoglycemic protocols -Discontinue glipizide -Hold metformin -CBGs every 4 hours overnight to ensure hypoglycemia resolves. -D50 as needed overnight blood sugar  Acute kidney injury vs. renal insufficiency superimposed CKD stage II/III: Patient presents with creatinine elevated up to 1.26 with BUN 17.  Baseline creatinine previously noted to be around 1. -  IV fluids as seen above -continue to monitor kidney function  Generalized weakness and weight loss: Weight loss reported of approximately 30 pounds per family.  Could be secondary to levothyroxine dose being too high. -Physical therapy and Occupational Therapy to eval and treat  Essential hypertension: Blood pressures currently maintained. -Continue amlodipine, metoprolol, and pharmacy substitution of the irbesartan for Diovan as tolerated  Hypothyroidism: Free T4 elevated at 1.21.  Levothyroxine dosage just recently been decreased from 150 mcg to 135 mcg -Check TSH -Continue levothyroxine -We will need to follow-up in outpatient setting for repeat TSH testing in 4 to 6 weeks  Normocytic anemia: Stable.  Hemoglobin 10.9 which appears near patient's baseline based off recent lab work done. -Continue to monitor  Hyperlipidemia -Continue atorvastatin  Vascular dementia: Patient appears to be alert and oriented x3.  However, family concern with this pulse was not erratic behavior. -Continue Namenda-donepezil  BPH -Continue finasteride  History of CVA with residual right-sided weakness. -Continue utilizing rolling walker  DVT prophylaxis: lovenox   Code Status: Full Family Communication: Discussed plan of care with the patient's daughter-in-law over the phone Disposition Plan: To be determined  Consults called: none Admission status: obsrvation  Norval Morton MD [facility] Pager [phone]   If 7PM-7AM, please contact night-coverage [email or web address] Password [email or web address]  12/31/2018, 1:42 PM

## 2018-12-31 NOTE — ED Notes (Signed)
Attempted report 

## 2018-12-31 NOTE — Progress Notes (Signed)
FARD BORUNDA [id number]  Code Status: FULL  Admission Data: 12/31/2018 4:22 PM  Attending Provider: Tamala Julian  JQB:HALPF, Herbie [place], MD  Consults/ Treatment Team:   JAKYE MULLENS is a 78 y.o. male patient admitted from ED awake, alert - oriented X 4 - no acute distress noted. VSS - Blood pressure (!) 146/69, pulse 69, temperature (!) 97.3 F (36.3 C), temperature source Oral, resp. rate 16, height 6' (1.829 m), weight 72.6 kg, SpO2 100 %. no c/o shortness of breath, no c/o chest pain. Orientation to room, and floor completed with information packet given to patient/family. Admission INP armband ID verified with patient/family, and in place.  Fall assessment complete, with patient and family able to verbalize understanding of risk associated with falls, and verbalized understanding to call nsg before up out of bed. Daughter called to bring pull ups and to pick up wallet. Call light within reach, patient able to voice, and demonstrate understanding. Skin, clean-dry- intact, small bruise noted to left elbow. No evidence of skin break down noted on exam.  ?  Will cont to eval and treat per MD orders.  Melonie Florida, RN  12/31/2018 4:22 PM

## 2018-12-31 NOTE — ED Provider Notes (Signed)
[facility] EMERGENCY DEPARTMENT Provider Note   CSN: [id number] Arrival date & time: 12/31/18  1139     History   Chief Complaint No chief complaint on file.   HPI Willie [name] is a 78 y.o. male.     Patient presents via EMS, lives in ECF, in independent living section, hx DM, with acute onset altered mental status, confusion, appeared sweaty this AM. Blood sugar low, 50. EMS gave iv glucose with improvement in blood sugar and mental status. En route, EMS notes alert, oriented, without specific c/o. Pt indicates he ate very little this AM. EMS notes recent ED visit w same. EMS notes some concern for possible mild dementia, and questioning patients ability to consistent feed self, and monitor self care. Patient denies headache. No chest pain or discomfort. No sob. Denies cough or uri symptoms. No fever or chills. No abd pain or nvd. No dysuria or gu c/o. Pt denies change in meds. No wt loss. No trauma/fall. No focal numbness/weakness. No change in speech or vision.   The history is provided by the patient and the EMS personnel.    Past Medical History:  Diagnosis Date  . Alcohol abuse   . Benign prostatic hypertrophy   . Dementia [facility])    daughter-in-law does not know of this   . Diabetes mellitus   . Encephalopathy   . GERD (gastroesophageal reflux disease)   . History of tarsorrhaphy    right eye  . Hypertension   . Hypothyroidism   . Pneumonia   . Stroke [facility])    right eye does not shut all the way-tarsorrhaphy    There are no active problems to display for this patient.   Past Surgical History:  Procedure Laterality Date  . COLONOSCOPY WITH PROPOFOL N/A 08/24/2014   Procedure: COLONOSCOPY WITH PROPOFOL;  Surgeon: Garlan Fair, MD;  Location: [facility];  Service: Endoscopy;  Laterality: N/A;  . EYE SURGERY     Laser eye surgery        Home Medications    Prior to Admission medications   Medication Sig Start Date End Date Taking?  Authorizing Provider  amLODipine (NORVASC) 5 MG tablet Take 5 mg by mouth every morning.    [provider]  Artificial Tear Ointment (LUBRICANT EYE) OINT Place 1 application into the right eye at bedtime. Attempt to tape right eye closed after administering lubricant. If unable to tape closed. Use eye patch.    [provider]  atorvastatin (LIPITOR) 10 MG tablet Take 10 mg by mouth every evening.    [provider]  atorvastatin (LIPITOR) 20 MG tablet Take 20 mg by mouth at bedtime.    [provider]  azithromycin (ZITHROMAX) 250 MG tablet Take 1 tablet (250 mg total) by mouth daily. Take first 2 tablets together, then 1 every day until finished. Patient not taking: Reported on 12/11/2018 07/16/16   Bjorn Pippin, PA-C  finasteride (PROSCAR) 5 MG tablet Take 5 mg by mouth every morning.    [provider]  glipiZIDE (GLUCOTROL XL) 2.5 MG 24 hr tablet Take 2.5 mg by mouth daily with breakfast.     [provider]  levothyroxine (SYNTHROID) 137 MCG tablet Take 137 mcg by mouth daily before breakfast.    [provider]  Memantine HCl-Donepezil HCl (NAMZARIC) 28-10 MG CP24 Take 1 capsule by mouth daily.    [provider]  metFORMIN (GLUCOPHAGE) 500 MG tablet Take 250 mg by mouth 2 (  two) times daily with a meal.     [provider]  metoprolol succinate (TOPROL-XL) 50 MG 24 hr tablet Take 50 mg by mouth every morning. Take with or immediately following a meal.    [provider]  mirabegron ER (MYRBETRIQ) 50 MG TB24 tablet Take 50 mg by mouth every evening.    [provider]  Multiple Vitamins-Minerals (MULTIVITAMIN ADULTS) TABS Take 1 tablet by mouth daily.    [provider]  sennosides-docusate sodium (SENOKOT-S) 8.6-50 MG tablet Take 1 tablet by mouth every morning.    [provider]  sodium chloride 1 g tablet Take 1 g by mouth daily.    [provider]  solifenacin  (VESICARE) 10 MG tablet Take 10 mg by mouth every evening.     [provider]  valsartan (DIOVAN) 80 MG tablet Take 80 mg by mouth every evening.    [provider]  Vitamin E 400 units TABS Take 1 tablet by mouth daily.    [provider]    Family History No family history on file.  Social History Social History   Tobacco Use  . Smoking status: Never Smoker  . Smokeless tobacco: Never Used  Substance Use Topics  . Alcohol use: Yes    Alcohol/week: 1.0 standard drinks    Types: 1 Glasses of wine per week    Comment: 1 glass of wine with dinner  . Drug use: No     Allergies   Aspirin   Review of Systems Review of Systems  Constitutional: Negative for fever.  HENT: Negative for sore throat.   Eyes: Negative for visual disturbance.  Respiratory: Negative for cough and shortness of breath.   Cardiovascular: Negative for chest pain.  Gastrointestinal: Negative for abdominal pain, diarrhea and vomiting.  Endocrine: Negative for polyuria.  Genitourinary: Negative for flank pain.  Musculoskeletal: Negative for back pain and neck pain.  Skin: Negative for rash.  Neurological: Negative for weakness, numbness and headaches.  Hematological: Does not bruise/bleed easily.  Psychiatric/Behavioral: Positive for confusion.     Physical Exam Updated Vital Signs BP 139/60 (BP Location: Right Arm)   Pulse 62   Temp (!) 97.5 F (36.4 C) (Oral)   Resp 17   Ht 1.829 m (6')   Wt 72.6 kg   SpO2 100%   BMI 21.70 kg/m   Physical Exam Vitals signs and nursing note reviewed.  Constitutional:      Appearance: Normal appearance. He is well-developed.  HENT:     Head: Atraumatic.     Nose: Nose normal.     Mouth/Throat:     Mouth: Mucous membranes are moist.     Pharynx: Oropharynx is clear.  Eyes:     General: No scleral icterus.    Extraocular Movements: Extraocular movements intact.     Conjunctiva/sclera: Conjunctivae normal.     Pupils: Pupils  are equal, round, and reactive to light.  Neck:     Musculoskeletal: Normal range of motion and neck supple. No neck rigidity or muscular tenderness.     Vascular: No carotid bruit.     Trachea: No tracheal deviation.     Comments: Thyroid not enlarged or tender. No stiffness or rigidity.  Cardiovascular:     Rate and Rhythm: Normal rate and regular rhythm.     Pulses: Normal pulses.     Heart sounds: Normal heart sounds. No murmur. No friction rub. No gallop.   Pulmonary:     Effort: Pulmonary effort  is normal. No accessory muscle usage or respiratory distress.     Breath sounds: Normal breath sounds.  Abdominal:     General: Bowel sounds are normal. There is no distension.     Palpations: Abdomen is soft.     Tenderness: There is no abdominal tenderness. There is no guarding.  Genitourinary:    Comments: No cva tenderness. Musculoskeletal:        General: No swelling.     Right lower leg: No edema.     Left lower leg: No edema.  Lymphadenopathy:     Cervical: No cervical adenopathy.  Skin:    General: Skin is warm and dry.     Findings: No rash.  Neurological:     Mental Status: He is alert.     Comments: Alert, speech clear/fluent. Motor intact bil, 5/5. sens grossly intact.   Psychiatric:        Mood and Affect: Mood normal.      ED Treatments / Results  Labs (all labs ordered are listed, but only abnormal results are displayed) Results for orders placed or performed during the hospital encounter of 12/31/18  CBC  Result Value Ref Range   WBC 5.5 4.0 - 10.5 K/uL   RBC 3.40 (L) 4.22 - 5.81 MIL/uL   Hemoglobin 10.9 (L) 13.0 - 17.0 g/dL   HCT 32.5 (L) 39.0 - 52.0 %   MCV 95.6 80.0 - 100.0 fL   MCH 32.1 26.0 - 34.0 pg   MCHC 33.5 30.0 - 36.0 g/dL   RDW 12.2 11.5 - 15.5 %   Platelets 296 150 - 400 K/uL   nRBC 0.0 0.0 - 0.2 %  Comprehensive metabolic panel  Result Value Ref Range   Sodium 125 (L) 135 - 145 mmol/L   Potassium 4.8 3.5 - 5.1 mmol/L   Chloride 94  (L) 98 - 111 mmol/L   CO2 22 22 - 32 mmol/L   Glucose, Bld 123 (H) 70 - 99 mg/dL   BUN 17 8 - 23 mg/dL   Creatinine, Ser 1.26 (H) 0.61 - 1.24 mg/dL   Calcium 8.9 8.9 - 10.3 mg/dL   Total Protein 6.2 (L) 6.5 - 8.1 g/dL   Albumin 3.3 (L) 3.5 - 5.0 g/dL   AST 19 15 - 41 U/L   ALT 11 0 - 44 U/L   Alkaline Phosphatase 66 38 - 126 U/L   Total Bilirubin 0.5 0.3 - 1.2 mg/dL   GFR calc non Af Amer 54 (L) >60 mL/min   GFR calc Af Amer >60 >60 mL/min   Anion gap 9 5 - 15  Urinalysis, Routine w reflex microscopic  Result Value Ref Range   Color, Urine YELLOW YELLOW   APPearance CLEAR CLEAR   Specific Gravity, Urine 1.008 1.005 - 1.030   pH 7.0 5.0 - 8.0   Glucose, UA NEGATIVE NEGATIVE mg/dL   Hgb urine dipstick NEGATIVE NEGATIVE   Bilirubin Urine NEGATIVE NEGATIVE   Ketones, ur NEGATIVE NEGATIVE mg/dL   Protein, ur NEGATIVE NEGATIVE mg/dL   Nitrite NEGATIVE NEGATIVE   Leukocytes,Ua NEGATIVE NEGATIVE  T4, free  Result Value Ref Range   Free T4 1.21 (H) 0.61 - 1.12 ng/dL  CBG monitoring, ED  Result Value Ref Range   Glucose-Capillary 118 (H) 70 - 99 mg/dL  CBG monitoring, ED  Result Value Ref Range   Glucose-Capillary 80 70 - 99 mg/dL   Comment 1 Notify RN    Comment 2 Document in Chart    Ct  Head Wo Contrast  Result Date: 12/28/2018 CLINICAL DATA:  Confusion/altered level of consciousness. EXAM: CT HEAD WITHOUT CONTRAST TECHNIQUE: Contiguous axial images were obtained from the base of the skull through the vertex without intravenous contrast. COMPARISON:  December 11, 2018 FINDINGS: Brain: Small remote lacunar infarct in the left thalamus, image 15/2, no change from prior. Otherwise, the brainstem, cerebellum, cerebral peduncles, thalamus, basal ganglia, basilar cisterns, and ventricular system appear within normal limits. Periventricular white matter and corona radiata hypodensities favor chronic ischemic microvascular white matter disease. No intracranial hemorrhage, mass lesion, or acute  CVA. Vascular: There is atherosclerotic calcification of the cavernous carotid arteries bilaterally. Skull: Unremarkable Sinuses/Orbits: Unremarkable Other: No supplemental non-categorized findings. IMPRESSION: 1. No acute intracranial findings. 2. Periventricular white matter and corona radiata hypodensities favor chronic ischemic microvascular white matter disease. 3. 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 Electronically Signed   By: Van Clines M.D.   On: 12/28/2018 13:39   Ct Head Wo Contrast  Result Date: 12/11/2018 CLINICAL DATA:  Found unresponsive. EXAM: CT HEAD WITHOUT CONTRAST TECHNIQUE: Contiguous axial images were obtained from the base of the skull through the vertex without intravenous contrast. COMPARISON:  Head CT 01/15/2011 FINDINGS: Brain: Stable age related cerebral atrophy, ventriculomegaly and periventricular white matter disease. No extra-axial fluid collections are identified. No CT findings for acute hemispheric infarction or intracranial hemorrhage. No mass lesions. The brainstem and cerebellum are normal. Vascular: Vascular calcifications but no aneurysm or hyperdense vessels. Skull: No skull fracture or bone lesion. Sinuses/Orbits: The paranasal sinuses and mastoid air cells are clear. The globes are intact. Other: No scalp lesions or scalp hematoma. IMPRESSION: Age related cerebral atrophy, ventriculomegaly and periventricular white matter disease but no acute intracranial findings. Electronically Signed   By: Marijo Sanes M.D.   On: 12/11/2018 12:33    EKG EKG Interpretation  Date/Time:  Tuesday December 31 2018 11:50:23 EDT Ventricular Rate:  62 PR Interval:    QRS Duration: 157 QT Interval:  456 QTC Calculation: 464 R Axis:   66 Text Interpretation:  Sinus rhythm Prolonged PR interval Right bundle branch block Nonspecific ST abnormality No significant change since last tracing Confirmed by Lajean Saver [phone]) on 12/31/2018 12:00:32 PM   Radiology No  results found.  Procedures Procedures (including critical care time)  Medications Ordered in ED Medications - No data to display   Initial Impression / Assessment and Plan / ED Course  I have reviewed the triage vital signs and the nursing notes.  Pertinent labs & imaging results that were available during my care of the patient were reviewed by me and considered in my medical decision making (see chart for details).  Iv ns. Labs.   Reviewed nursing notes and prior charts for additional history. Pt with two head cts within past month for confusion - negative then.   Encouraged to eat/meal.   Labs reviewed by me - Na lower than prior, 125. Given progressive hyponatremia, recurrent episodes confusion and hypoglycemia, will admit.   Ns 1 liter bolus.   Medicine service consulted for admission.  Recheck cbg, improved.     Final Clinical Impressions(s) / ED Diagnoses   Final diagnoses:  None    ED Discharge Orders    None       Lajean Saver, MD 12/31/18 1331

## 2018-12-31 NOTE — ED Triage Notes (Signed)
Pt from [facility] via ems;  Per family, pt evaluated at [facility] recently for hypoglycemia and loc; staff checked on pt today, found himconfused, weak; pt being given OJ by staff on ems arrival; 56 CBG initally; 10 to 12 oz orange juice given with ems, cbg 69; 20 ga lac; 12.5 D50 given iv; a and o x 4 with ems, no loc today; pt endorses recent change in metformin, test strips expired; hx cva, tia, htn, dm type 2; pt lives in independent part of facility; pt c/o generalized weakness x 2 weeks, denies pain; denies cough, fevers  T 97.38F HR 64 sinus 143/68 98% RA CBG 118

## 2018-12-31 NOTE — ED Notes (Signed)
FAM WILL PICKUP IF DC'D

## 2018-12-31 NOTE — ED Notes (Signed)
Pt aware that a urine sample is needed; urinal at bedside 

## 2018-12-31 NOTE — ED Notes (Signed)
Pt given food and beverage per Dr. Ashok Cordia

## 2018-12-31 NOTE — ED Notes (Signed)
ED TO INPATIENT HANDOFF REPORT  ED Nurse Name and Phone #: [phone]  S Name/Age/Gender Willie [name] 78 y.o. male Room/Bed: 622W/979G  Code Status   Code Status: Full Code  Home/SNF/Other Nursing Home Patient oriented to: self, place, time and situation Is this baseline? Yes   Triage Complete: Triage complete  Chief Complaint hypoglycemia  Triage Note Pt from [facility] via [facility]s;  Per family, pt evaluated at [facility] recently for hypoglycemia and loc; staff checked on pt today, found himconfused, weak; pt being given OJ by staff on ems arrival; 56 CBG initally; 10 to 12 oz orange juice given with ems, cbg 69; 20 ga lac; 12.5 D50 given iv; a and o x 4 with ems, no loc today; pt endorses recent change in metformin, test strips expired; hx cva, tia, htn, dm type 2; pt lives in independent part of facility; pt c/o generalized weakness x 2 weeks, denies pain; denies cough, fevers  T 97.79F HR 64 sinus 143/68 98% RA CBG 118   Allergies Allergies  Allergen Reactions  . Aspirin Other (See Comments)    Had stroke in March last year. No more aspirin.    Level of Care/Admitting Diagnosis ED Disposition    ED Disposition Condition [facility] Area: [place] [100100]  Level of Care: Telemetry Medical [104]  I expect the patient will be discharged within 24 hours: No (not a candidate for 5C-Observation unit)  Covid Evaluation: Asymptomatic Screening Protocol (No Symptoms)  Diagnosis: Acute metabolic encephalopathy [9211941]  Admitting Physician: SAHEJ, SCHRIEBER [[id number]]  Attending Physician: Norval Morton [[id number]]  PT Class (Do Not Modify): Observation [104]  PT Acc Code (Do Not Modify): Observation [10022]       B Medical/Surgery History Past Medical History:  Diagnosis Date  . Alcohol abuse   . Benign prostatic hypertrophy   . Dementia [facility])    daughter-in-law does not know of this   . Diabetes mellitus   .  Encephalopathy   . GERD (gastroesophageal reflux disease)   . History of tarsorrhaphy    right eye  . Hypertension   . Hypothyroidism   . Pneumonia   . Stroke [facility])    right eye does not shut all the way-tarsorrhaphy   Past Surgical History:  Procedure Laterality Date  . COLONOSCOPY WITH PROPOFOL N/A 08/24/2014   Procedure: COLONOSCOPY WITH PROPOFOL;  Surgeon: Garlan Fair, MD;  Location: [facility];  Service: Endoscopy;  Laterality: N/A;  . EYE SURGERY     Laser eye surgery     A IV Location/Drains/Wounds Patient Lines/Drains/Airways Status   Active Line/Drains/Airways    Name:   Placement date:   Placement time:   Site:   Days:   Peripheral IV 12/31/18 Left Antecubital   12/31/18    -    Antecubital   less than 1   Airway   08/24/14    1246     1590          Intake/Output Last 24 hours No intake or output data in the 24 hours ending 12/31/18 1433  Labs/Imaging Results for orders placed or performed during the hospital encounter of 12/31/18 (from the past 48 hour(s))  CBG monitoring, ED     Status: Abnormal   Collection Time: 12/31/18 11:47 AM  Result Value Ref Range   Glucose-Capillary 118 (H) 70 - 99 mg/dL  CBC     Status: Abnormal   Collection Time: 12/31/18 12:05  PM  Result Value Ref Range   WBC 5.5 4.0 - 10.5 K/uL   RBC 3.40 (L) 4.22 - 5.81 MIL/uL   Hemoglobin 10.9 (L) 13.0 - 17.0 g/dL   HCT 32.5 (L) 39.0 - 52.0 %   MCV 95.6 80.0 - 100.0 fL   MCH 32.1 26.0 - 34.0 pg   MCHC 33.5 30.0 - 36.0 g/dL   RDW 12.2 11.5 - 15.5 %   Platelets 296 150 - 400 K/uL   nRBC 0.0 0.0 - 0.2 %    Comment: Performed at [facility], [place] [address], [place], [place] [address]  Comprehensive metabolic panel     Status: Abnormal   Collection Time: 12/31/18 12:05 PM  Result Value Ref Range   Sodium 125 (L) 135 - 145 mmol/L   Potassium 4.8 3.5 - 5.1 mmol/L   Chloride 94 (L) 98 - 111 mmol/L   CO2 22 22 - 32 mmol/L   Glucose, Bld 123 (H) 70 - 99 mg/dL   BUN 17 8 - 23  mg/dL   Creatinine, Ser 1.26 (H) 0.61 - 1.24 mg/dL   Calcium 8.9 8.9 - 10.3 mg/dL   Total Protein 6.2 (L) 6.5 - 8.1 g/dL   Albumin 3.3 (L) 3.5 - 5.0 g/dL   AST 19 15 - 41 U/L   ALT 11 0 - 44 U/L   Alkaline Phosphatase 66 38 - 126 U/L   Total Bilirubin 0.5 0.3 - 1.2 mg/dL   GFR calc non Af Amer 54 (L) >60 mL/min   GFR calc Af Amer >60 >60 mL/min   Anion gap 9 5 - 15    Comment: Performed at [facility], [place] [address] [place], [place] [address]  T4, free     Status: Abnormal   Collection Time: 12/31/18 12:05 PM  Result Value Ref Range   Free T4 1.21 (H) 0.61 - 1.12 ng/dL    Comment: (NOTE) Biotin ingestion may interfere with free T4 tests. If the results are inconsistent with the TSH level, previous test results, or the clinical presentation, then consider biotin interference. If needed, order repeat testing after stopping biotin. Performed at [facility], [place] [address] [place], [place] [address]   CBG monitoring, ED     Status: None   Collection Time: 12/31/18 12:48 PM  Result Value Ref Range   Glucose-Capillary 80 70 - 99 mg/dL   Comment 1 Notify RN    Comment 2 Document in Chart   Urinalysis, Routine w reflex microscopic     Status: None   Collection Time: 12/31/18  1:10 PM  Result Value Ref Range   Color, Urine YELLOW YELLOW   APPearance CLEAR CLEAR   Specific Gravity, Urine 1.008 1.005 - 1.030   pH 7.0 5.0 - 8.0   Glucose, UA NEGATIVE NEGATIVE mg/dL   Hgb urine dipstick NEGATIVE NEGATIVE   Bilirubin Urine NEGATIVE NEGATIVE   Ketones, ur NEGATIVE NEGATIVE mg/dL   Protein, ur NEGATIVE NEGATIVE mg/dL   Nitrite NEGATIVE NEGATIVE   Leukocytes,Ua NEGATIVE NEGATIVE    Comment: Performed at [facility], [place] [address], [place], [place] [address]   No results found.  Pending Labs Unresulted Labs (From admission, onward)    Start     Ordered   01/01/19 0500  CBC  Tomorrow morning,   R     12/31/18 1428   12/31/18 4665  Basic metabolic panel  Now  then every 6 hours,   R (with  STAT occurrences)     12/31/18 1428   12/31/18 1325  SARS Coronavirus 2 (CEPHEID - Performed in [place] hospital lab), Hosp Order  (Asymptomatic Patients Labs)  Once,   STAT    Question:  Rule Out  Answer:  Yes   12/31/18 1324   12/31/18 1150  TSH  ONCE - STAT,   STAT     12/31/18 1149          Vitals/Pain Today's Vitals   12/31/18 1152 12/31/18 1153 12/31/18 1230 12/31/18 1315  BP: 139/60  (!) 111/59 (!) 157/77  Pulse: 62  (!) 59 65  Resp: 17  14 11   Temp: (!) 97.5 F (36.4 C)     TempSrc: Oral     SpO2: 100%  99% 96%  Weight:  72.6 kg    Height:  6' (1.829 m)    PainSc: 0-No pain       Isolation Precautions No active isolations  Medications Medications  amLODipine (NORVASC) tablet 5 mg (has no administration in time range)  metoprolol tartrate (LOPRESSOR) tablet 50 mg (has no administration in time range)  irbesartan (AVAPRO) tablet 75 mg (has no administration in time range)  levothyroxine (SYNTHROID) tablet 137 mcg (has no administration in time range)  mirabegron ER (MYRBETRIQ) tablet 50 mg (has no administration in time range)  Memantine HCl-Donepezil HCl 28-10 MG CP24 1 capsule (has no administration in time range)  sennosides-docusate sodium (SENOKOT-S) 8.6-50 MG tablet 1 tablet (has no administration in time range)  finasteride (PROSCAR) tablet 5 mg (has no administration in time range)  Lubricant Eye OINT 1 application (has no administration in time range)  sodium chloride tablet 1 g (has no administration in time range)  atorvastatin (LIPITOR) tablet 10 mg (has no administration in time range)  enoxaparin (LOVENOX) injection 40 mg (has no administration in time range)  sodium chloride flush (NS) 0.9 % injection 3 mL (has no administration in time range)  ondansetron (ZOFRAN) tablet 4 mg (has no administration in time range)    Or  ondansetron (ZOFRAN) injection 4 mg (has no administration in time range)  acetaminophen  (TYLENOL) tablet 650 mg (has no administration in time range)    Or  acetaminophen (TYLENOL) suppository 650 mg (has no administration in time range)  sodium chloride 0.9 % bolus 1,000 mL (1,000 mLs Intravenous New Bag/Given 12/31/18 1309)    Mobility walks with device Moderate fall risk   Focused Assessments Cardiac Assessment Handoff:  Cardiac Rhythm: Normal sinus rhythm No results found for: CKTOTAL, CKMB, CKMBINDEX, TROPONINI No results found for: DDIMER Does the Patient currently have chest pain? No      R Recommendations: See Admitting Provider Note  Report given to:   Additional Notes:

## 2019-01-01 DIAGNOSIS — R4182 Altered mental status, unspecified: Secondary | ICD-10-CM

## 2019-01-01 DIAGNOSIS — I10 Essential (primary) hypertension: Secondary | ICD-10-CM | POA: Diagnosis not present

## 2019-01-01 DIAGNOSIS — N179 Acute kidney failure, unspecified: Secondary | ICD-10-CM | POA: Diagnosis not present

## 2019-01-01 DIAGNOSIS — G9341 Metabolic encephalopathy: Secondary | ICD-10-CM | POA: Diagnosis not present

## 2019-01-01 DIAGNOSIS — R531 Weakness: Secondary | ICD-10-CM

## 2019-01-01 LAB — GLUCOSE, CAPILLARY
Glucose-Capillary: 86 mg/dL (ref 70–99)
Glucose-Capillary: 107 mg/dL — ABNORMAL HIGH (ref 70–99)
Glucose-Capillary: 109 mg/dL — ABNORMAL HIGH (ref 70–99)
Glucose-Capillary: 124 mg/dL — ABNORMAL HIGH (ref 70–99)
Glucose-Capillary: 130 mg/dL — ABNORMAL HIGH (ref 70–99)

## 2019-01-01 LAB — BASIC METABOLIC PANEL
Anion gap: 7 (ref 5–15)
Anion gap: 7 (ref 5–15)
BUN: 14 mg/dL (ref 8–23)
BUN: 12 mg/dL (ref 8–23)
CO2: 24 mmol/L (ref 22–32)
CO2: 24 mmol/L (ref 22–32)
Calcium: 8.7 mg/dL — ABNORMAL LOW (ref 8.9–10.3)
Calcium: 8.8 mg/dL — ABNORMAL LOW (ref 8.9–10.3)
Chloride: 98 mmol/L (ref 98–111)
Chloride: 98 mmol/L (ref 98–111)
Creatinine, Ser: 1.02 mg/dL (ref 0.61–1.24)
Creatinine, Ser: 0.92 mg/dL (ref 0.61–1.24)
GFR calc Af Amer: >60 mL/min (ref >60)
GFR calc Af Amer: >60 mL/min (ref >60)
GFR calc non Af Amer: >60 mL/min (ref >60)
GFR calc non Af Amer: >60 mL/min (ref >60)
Glucose, Bld: 90 mg/dL (ref 70–99)
Glucose, Bld: 115 mg/dL — ABNORMAL HIGH (ref 70–99)
Potassium: 4.2 mmol/L (ref 3.5–5.1)
Potassium: 4.3 mmol/L (ref 3.5–5.1)
Sodium: 129 mmol/L — ABNORMAL LOW (ref 135–145)
Sodium: 129 mmol/L — ABNORMAL LOW (ref 135–145)

## 2019-01-01 LAB — CBC
HCT: 29.2 % — ABNORMAL LOW (ref 39.0–52.0)
Hemoglobin: 10.2 g/dL — ABNORMAL LOW (ref 13.0–17.0)
MCH: 32.2 pg (ref 26.0–34.0)
MCHC: 34.9 g/dL (ref 30.0–36.0)
MCV: 92.1 fL (ref 80.0–100.0)
Platelets: 291 10*3/uL (ref 150–400)
RBC: 3.17 MIL/uL — ABNORMAL LOW (ref 4.22–5.81)
RDW: 11.9 % (ref 11.5–15.5)
WBC: 8.7 10*3/uL (ref 4.0–10.5)
nRBC: 0 % (ref 0.0–0.2)

## 2019-01-01 NOTE — Plan of Care (Signed)
  Problem: Activity: Goal: Risk for activity intolerance will decrease Outcome: Progressing   Problem: Safety: Goal: Ability to remain free from injury will improve Outcome: Progressing   Problem: Health Behavior/Discharge Planning: Goal: Ability to manage health-related needs will improve Outcome: Progressing   Problem: Clinical Measurements: Goal: Ability to maintain clinical measurements within normal limits will improve Outcome: Progressing

## 2019-01-01 NOTE — Progress Notes (Signed)
[facility] PROGRESS NOTE  Willie [name] EQA:[id number] DOB: 10-23-1940 DOA: 12/31/2018 PCP: Josetta Huddle, MD  Assessment/Plan: Acute metabolic encephalopathy:  Appears to be at baseline this am.   Patient was confused and found to be hypoglycemic and hyponatremia. Sodium remains low at 129 but he is receiving D51/2NS IV.   Patient CT scan of the brain showed no acute abnormalities.  no events on tele. Neuro exam benign.  TSH 1.58, freeT4 1.21. -will continue D51/2 NS at slower rate as patient eating this am -cbg's every 4 hours -wean IV fluids as able -bmet at noon today -Social work consult for need placement in a different assisted living facility  Hyponatremia: Acute on chronic.  Sodium 125 on admission which is lower than previous. This am sodium 129. Urine osmolality 183, Urine sodium 63.   Home meds include  on salt tablets. Chart review shows most recent data 2012 and sodium normal -Continue salt tablets 1 g daily -bmet noon and in am -Goal correction no more than 10 per 24 hours -adjust fluids as needed   Diabetes mellitus type 2 with hypoglycemia: Acute.  Patient's initial blood glucose noted to be around 50 when EMS arrived.  Patient was given dextrose in route with improvement of blood glucose. Home meds include metformin and glipizide. D51/2NS provided as glucose trended down. This am cbg 108. Patient about to eat breakfast. Of note, this is 3rd presentation this month with hypoglycemia.  -Hypoglycemic protocols -Discontinue glipizide -Hold metformin -CBGs every 4 hours  to ensure hypoglycemia resolves. -D50 as needed   Acute kidney injury vs. renal insufficiency superimposed CKD stage II/III: resolved this am.  Patient presented with creatinine elevated up to 1.26 with BUN 17.  Baseline creatinine previously noted to be around 1. Creatinine 1.02 this am.  -IV fluids as seen above -continue to monitor kidney function  Generalized weakness and weight loss: Weight  loss reported of approximately 30 pounds per family.etiology unclear but suspect dementia playing a role as family reports he "forgets to eat". He also demonstrates some paranoia stating "[facility] trying to kill me".  Eating an omlet this am -social work consult as he may need higher level of care -Physical therapy and Occupational Therapy to eval and treat  Essential hypertension: controlled. Home meds include amlodipine, metoprolol,  Diovan  -continue home meds -monitor  Hypothyroidism: Free T4 elevated at 1.21.  Levothyroxine dosage just recently been decreased from 150 mcg to 135 mcg. TSH within limits of normal -Continue levothyroxine -We will need to follow-up in outpatient setting for repeat TSH testing in 4 to 6 weeks  Normocytic anemia: Stable.  Hemoglobin 10.9 which appears near patient's baseline based off recent lab work done. -Continue to monitor  Hyperlipidemia -Continue atorvastatin  Vascular dementia: Patient appears to be alert and oriented x3.  However, difficulty processing information.  family concern with  erratic behavior. Has been displaying behavior consistent with progressing dementia. Concern for managing his diabetes -Continue Namenda-donepezil  BPH -Continue finasteride  History of CVA with residual right-sided weakness. -Continue utilizing rolling walker   Code Status: full Family Communication: julie daughter in law on phone Disposition Plan: back to facility. Of note, he lacks insight to his own needs, he believes that [facility] is the reason he has needed to come to hospital 3 time this month   Consultants:    Procedures:    Antibiotics:    HPI/Subjective: Awake alert denies pain/discomfort.   78 yo admitted with acute encephalopathy related to  hypoglycemia and/or hyponatremia. At baseline dementia progressing per family. This is 3rd presentation (1st admission) this month with hypoglycemia and  encephalopathy  Objective: Vitals:   12/31/18 2056 01/01/19 0439  BP: 135/62 (!) 121/58  Pulse: 72 72  Resp:    Temp: 97.9 F (36.6 C) (!) 97.5 F (36.4 C)  SpO2: 100% 99%    Intake/Output Summary (Last 24 hours) at 01/01/2019 0808 Last data filed at 01/01/2019 0101 Gross per 24 hour  Intake 1840.81 ml  Output 1600 ml  Net 240.81 ml   Filed Weights   12/31/18 1153  Weight: 72.6 kg    Exam:   General:  Awake alert no acute distress  Cardiovascular: rrr no mgr no LE edema  Respiratory: normal effort BS clear bilaterally no wheeze  Abdomen: soft non-distended +BS no guarding or rebouding  Musculoskeletal: joints without swelling/erythema   Data Reviewed: Basic Metabolic Panel: Recent Labs  Lab 12/28/18 1254 12/31/18 1205 12/31/18 1440 12/31/18 2053 01/01/19 0315  NA 127* 125* 127* 130* 129*  K 4.6 4.8 4.6 5.1 4.2  CL 93* 94* 97* 97* 98  CO2 23 22 20* 25 24  GLUCOSE 104* 123* 74 144* 90  BUN 22 17 16 14 14   CREATININE 1.25* 1.26* 1.14 1.04 1.02  CALCIUM 9.0 8.9 8.9 9.2 8.7*   Liver Function Tests: Recent Labs  Lab 12/28/18 1254 12/31/18 1205  AST 22 19  ALT 14 11  ALKPHOS 93 66  BILITOT 0.4 0.5  PROT 7.5 6.2*  ALBUMIN 4.3 3.3*   No results for input(s): LIPASE, AMYLASE in the last 168 hours. No results for input(s): AMMONIA in the last 168 hours. CBC: Recent Labs  Lab 12/28/18 1254 12/31/18 1205 01/01/19 0315  WBC 7.1 5.5 8.7  NEUTROABS 3.9  --   --   HGB 12.0* 10.9* 10.2*  HCT 36.1* 32.5* 29.2*  MCV 96.8 95.6 92.1  PLT 322 296 291   Cardiac Enzymes: No results for input(s): CKTOTAL, CKMB, CKMBINDEX, TROPONINI in the last 168 hours. BNP (last 3 results) No results for input(s): BNP in the last 8760 hours.  ProBNP (last 3 results) No results for input(s): PROBNP in the last 8760 hours.  CBG: Recent Labs  Lab 12/31/18 1433 12/31/18 1617 12/31/18 2054 01/01/19 0436 01/01/19 0802  GLUCAP 81 92 125* 86 107*    Recent Results  (from the past 240 hour(s))  SARS Coronavirus 2 (CEPHEID - Performed in [facility] hospital lab), Hosp Order     Status: None   Collection Time: 12/31/18  1:40 PM   Specimen: Nasopharyngeal Swab  Result Value Ref Range Status   SARS Coronavirus 2 NEGATIVE NEGATIVE Final    Comment: (NOTE) If result is NEGATIVE SARS-CoV-2 target nucleic acids are NOT DETECTED. The SARS-CoV-2 RNA is generally detectable in upper and lower  respiratory specimens during the acute phase of infection. The lowest  concentration of SARS-CoV-2 viral copies this assay can detect is 250  copies / mL. A negative result does not preclude SARS-CoV-2 infection  and should not be used as the sole basis for treatment or other  patient management decisions.  A negative result may occur with  improper specimen collection / handling, submission of specimen other  than nasopharyngeal swab, presence of viral mutation(s) within the  areas targeted by this assay, and inadequate number of viral copies  (<250 copies / mL). A negative result must be combined with clinical  observations, patient history, and epidemiological information. If result is POSITIVE SARS-CoV-2  target nucleic acids are DETECTED. The SARS-CoV-2 RNA is generally detectable in upper and lower  respiratory specimens dur ing the acute phase of infection.  Positive  results are indicative of active infection with SARS-CoV-2.  Clinical  correlation with patient history and other diagnostic information is  necessary to determine patient infection status.  Positive results do  not rule out bacterial infection or co-infection with other viruses. If result is PRESUMPTIVE POSTIVE SARS-CoV-2 nucleic acids MAY BE PRESENT.   A presumptive positive result was obtained on the submitted specimen  and confirmed on repeat testing.  While 2019 novel coronavirus  (SARS-CoV-2) nucleic acids may be present in the submitted sample  additional confirmatory testing may be  necessary for epidemiological  and / or clinical management purposes  to differentiate between  SARS-CoV-2 and other Sarbecovirus currently known to infect humans.  If clinically indicated additional testing with an alternate test  methodology [phone]) is advised. The SARS-CoV-2 RNA is generally  detectable in upper and lower respiratory sp ecimens during the acute  phase of infection. The expected result is Negative. Fact Sheet for Patients:  StrictlyIdeas.no Fact Sheet for Healthcare Providers: BankingDealers.co.za This test is not yet approved or cleared by the [place] FDA and has been authorized for detection and/or diagnosis of SARS-CoV-2 by FDA under an Emergency Use Authorization (EUA).  This EUA will remain in effect (meaning this test can be used) for the duration of the COVID-19 declaration under Section 564(b)(1) of the Act, 21 U.S.C. section 360bbb-3(b)(1), unless the authorization is terminated or revoked sooner. Performed at [facility], [place] [address] [place], [place] [address]      Studies: No results found.  Scheduled Meds: . amLODipine  5 mg Oral Daily  . artificial tears  1 application Right Eye QHS  . atorvastatin  10 mg Oral QPM  . memantine  28 mg Oral Daily   And  . donepezil  10 mg Oral Daily  . enoxaparin (LOVENOX) injection  40 mg Subcutaneous Q24H  . finasteride  5 mg Oral q morning - 10a  . irbesartan  75 mg Oral QHS  . levothyroxine  137 mcg Oral QAC breakfast  . metoprolol tartrate  50 mg Oral Daily  . mirabegron ER  50 mg Oral QPM  . senna-docusate  1 tablet Oral Daily  . sodium chloride flush  3 mL Intravenous Q12H  . sodium chloride  1 g Oral Daily   Continuous Infusions: . dextrose 5 % and 0.45% NaCl 75 mL/hr at 01/01/19 0101    Principal Problem:   Acute metabolic encephalopathy Active Problems:   Controlled type 2 diabetes mellitus with hypoglycemia (HCC)   Acute kidney  injury superimposed on CKD (HCC)   Hyponatremia   Hypothyroidism   Normocytic normochromic anemia    Time spent: [place] NP  [facility]  If 7PM-7AM, please contact night-coverage at [email or web address], password [facility] 01/01/2019, 8:08 AM  LOS: 0 days

## 2019-01-01 NOTE — Evaluation (Signed)
Occupational Therapy Evaluation Patient Details Name: Willie [name] MRN: [id number] DOB: [birth year] Today's Date: 01/01/2019    History of Present Illness 78 y.o. male with medical history significant of vascular dementia, CVA with residual right-sided weakness, diabetes mellitus type 2, and hypothyroidism; who presents after found him to be confused and weak. He has been in the independent living section of [name] and has been doing well.  Initial blood sugar noted to be 56 for which patient was given orange juice with EMS with repeat 69.   Clinical Impression   Pt admitted with above and presents to OT near baseline.  Pt required supervision/cues for safety with RW, at pt uses Rollator at baseline.  Pt completed LB bathing and dressing at overall supervision level with alternating UE support.  Pt would benefit from [place] upon d/c.    Follow Up Recommendations  Home health OT;Supervision - Intermittent    Equipment Recommendations  None recommended by OT       Precautions / Restrictions Precautions Precautions: Fall Restrictions Weight Bearing Restrictions: No      Mobility Bed Mobility Overal bed mobility: Modified Independent                Transfers Overall transfer level: Needs assistance Equipment used: Rolling walker (2 wheeled) Transfers: Sit to/from Stand Sit to Stand: Supervision                  ADL either performed or assessed with clinical judgement   ADL Overall ADL's : Needs assistance/impaired     Grooming: Wash/dry hands;Wash/dry face;Standing;Supervision/safety;Set up       Lower Body Bathing: Sit to/from stand;Supervison/ safety;Set up       Lower Body Dressing: Supervision/safety;Sit to/from stand   Toilet Transfer: Consulting civil engineer Details (indicate cue type and reason): Cues for sequencing with RW, pt reports using Rollator at baseline         Functional mobility during ADLs:  Supervision/safety;Min guard General ADL Comments: Pt demonstrated ability to wash peri area in standing and don pull up brief at sit> stand level with supervision and alternating UE support on RW.  Pt uses Rollator at home.     Vision Baseline Vision/History: Wears glasses Wears Glasses: At all times Patient Visual Report: No change from baseline(Rt eye weeping, from CVA) Vision Assessment?: No apparent visual deficits            Pertinent Vitals/Pain Pain Assessment: No/denies pain     Hand Dominance Right   Extremity/Trunk Assessment Upper Extremity Assessment Upper Extremity Assessment: Overall WFL for tasks assessed(previous CVA ~5 years ago, good strength overall)           Communication Communication Communication: No difficulties   Cognition Arousal/Alertness: Awake/alert Behavior During Therapy: WFL for tasks assessed/performed Overall Cognitive Status: Within Functional Limits for tasks assessed                                 General Comments: pt able to recall 3/3 words immediate recall, able to recall 2/3 after ~5 mins              Home Living Family/patient expects to be discharged to:: Assisted living                             Home Equipment: Walker - 4 wheels;Tub bench;Grab bars - tub/shower;Grab bars - toilet  Prior Functioning/Environment Level of Independence: Independent with assistive device(s)                 OT Problem List: Decreased activity tolerance         OT Goals(Current goals can be found in the care plan section) Acute Rehab OT Goals Patient Stated Goal: to get his diet straightened out OT Goal Formulation: All assessment and education complete, DC therapy   AM-PAC OT "6 Clicks" Daily Activity     Outcome Measure Help from another person eating meals?: None Help from another person taking care of personal grooming?: A Little Help from another person toileting, which includes using  toliet, bedpan, or urinal?: A Little Help from another person bathing (including washing, rinsing, drying)?: A Little Help from another person to put on and taking off regular upper body clothing?: A Little Help from another person to put on and taking off regular lower body clothing?: A Little 6 Click Score: 19   End of Session Equipment Utilized During Treatment: Gait belt;Rolling walker Nurse Communication: Mobility status  Activity Tolerance: Patient tolerated treatment well;No increased pain Patient left: in bed;with call bell/phone within reach;with bed alarm set  OT Visit Diagnosis: Unsteadiness on feet (R26.81)                Time: 0258-5277 OT Time Calculation (min): 27 min Charges:  OT General Charges $OT Visit: 1 Visit OT Evaluation $OT Eval Low Complexity: 1 Low OT Treatments $Self Care/Home Management : 8-22 mins   Simonne Come, [phone] 01/01/2019, 9:48 AM

## 2019-01-01 NOTE — TOC Initial Note (Signed)
Transition of Care [facility]) - Initial/Assessment Note    Patient Details  Name: Willie [name] MRN: [id number] Date of Birth: [birth year]  [facility]) CM/SW Contact:    Benard Halsted, LCSW Phone Number: 01/01/2019, 2:39 PM  Clinical Narrative:                 CSW received consult regarding discharge plan. CSW spoke with patient son and daughter in law. They reported that patient has told them that he is moving out of [place] into an apartment because he does not like it anymore. They questioned if they should encourage him to go to the ALF side. CSW told them that patient has had several admissions and he may need medication help and supervision with food. [name] stated patient keeps going back and forth about staying at [facility] and leaving and will not share details with family. CSW emailed [name] a list of other ALFs in case they need it, but otherwise they should encourage patient to stay in independent living at least rather than a regular apartment. [name] stated she has toured [name] and she liked it but she is not sure she can convince patient to go there. CSW available for other needs, but hospital is unable to offer other living options. CSW alerted [facility] that patient is in the hospital since he is active with them.   Expected Discharge Plan: [place] Barriers to Discharge: Continued Medical Work up   Patient Goals and CMS Choice Patient states their goals for this hospitalization and ongoing recovery are:: Return home   Choice offered to / list presented to : NA  Expected Discharge Plan and Services Expected Discharge Plan: [place] In-house Referral: Clinical Social Work Discharge Planning Services: CM Consult Post Acute Care Choice: [place] arrangements for the past 2 months: [place]                 DME Arranged: N/A         HH Arranged: RN, PT, Nurse's  Aide, Social Work [employer] Agency: [facility] [facility] [employer] Date [place]: 01/01/19 Time [place]: [place] Representative spoke with at [place]: [place]  Prior Living Arrangements/Services Living arrangements for the past 2 months: [place] Lives with:: Self Patient language and need for interpreter reviewed:: Yes Do you feel safe going back to the place where you live?: Yes      Need for Family Participation in Patient Care: Yes (Comment) Care giver support system in place?: Yes (comment) Current home services: DME Criminal Activity/Legal Involvement Pertinent to Current Situation/Hospitalization: No - Comment as needed  Activities of Daily Living      Permission Sought/Granted Permission sought to share information with : Case Manager, Family Supports Permission granted to share information with : Yes, Verbal Permission Granted  Share Information with NAME: Teyon Odette  Permission granted to share info w AGENCY: [facility]  Permission granted to share info w Relationship: Daughter in Oncologist granted to share info w Contact Information: [phone]  Emotional Assessment Appearance:: Appears stated age Attitude/Demeanor/Rapport: Engaged Affect (typically observed): Accepting Orientation: : Oriented to Self, Oriented to Place, Oriented to  Time, Oriented to Situation(Sometimes not comprehending w/dementia) Alcohol / Substance Use: Not Applicable Psych Involvement: No (comment)  Admission diagnosis:  Hyponatremia [E87.1] Weakness generalized [R53.1] Hypoglycemia [E16.2] Acute alteration in mental status [R41.82] Patient Active Problem  List   Diagnosis Date Noted  . Essential hypertension 01/01/2019  . Generalized weakness 01/01/2019  . Acute metabolic encephalopathy 26/41/5830  . Controlled type 2 diabetes mellitus with hypoglycemia ([place]) 12/31/2018  . Hypothyroidism 12/31/2018  . Normocytic normochromic anemia  12/31/2018  . Acute kidney injury superimposed on CKD ([place]) 12/31/2018  . Hyponatremia 12/31/2018   PCP:  Josetta Huddle, MD Pharmacy:   [facility] [place] - [address] [place] [address] Phone: [phone] Fax: [phone]  [facility] [place] [address] Phone: [phone] Fax: [phone]     Social Determinants of Health (SDOH) Interventions    Readmission Risk Interventions No flowsheet data found.

## 2019-01-01 NOTE — Progress Notes (Signed)
Inpatient Diabetes Program Recommendations  AACE/ADA: New Consensus Statement on Inpatient Glycemic Control (2015)  Target Ranges:  Prepandial:   less than 140 mg/dL      Peak postprandial:   less than 180 mg/dL (1-2 hours)      Critically ill patients:  140 - 180 mg/dL   Results for VOLLIE, BRUNTY (MRN [id number]) as of 01/01/2019 13:15  Ref. Range 12/31/2018 16:17 12/31/2018 20:54 01/01/2019 04:36 01/01/2019 08:02 01/01/2019 12:05  Glucose-Capillary Latest Ref Range: 70 - 99 mg/dL 92 125 (H) 86 107 (H) 109 (H)    Review of Glycemic Control  Diabetes history: DM 2 Outpatient Diabetes medications: Glipizide 2.5 mg Daily, Metformin 250 mg bid Current orders for Inpatient glycemic control: on Hold  D5 1/2 NS currently running  Inpatient Diabetes Program Recommendations:    Admitted with hypoglycemia. On D5 gtt. Watch patient until glucose trends consistently are above 180 mg/dl.   May can start Novolog sensitive Correction tid or restart patient's Metformin and continue CBG checks.  At time of d/c a goal A1c between 7-8% is acceptable in the age range patient is in. I would guess that the patient's A1c is lower than this. Would not restart Glipizide at time of d/c. Patient should be fine to continue Metformin.  Thanks,  Tama Headings RN, MSN, BC-ADM Inpatient Diabetes Coordinator Team Pager [phone] (8a-5p)

## 2019-01-01 NOTE — Care Management Obs Status (Signed)
[place] NOTIFICATION   Patient Details  Name: Willie [name] MRN: [id number] Date of Birth: Jun 03, 1941   Medicare Observation Status Notification Given:       Benard Halsted, LCSW 01/01/2019, 2:54 PM

## 2019-01-01 NOTE — Evaluation (Signed)
Physical Therapy Evaluation Patient Details Name: Willie [name] MRN: [id number] DOB: 05-16-1941 Today's Date: 01/01/2019   History of Present Illness  78 y.o. male with medical history significant of vascular dementia, CVA with residual right-sided weakness, diabetes mellitus type 2, and hypothyroidism; who presents after found him to be confused and weak. He has been in the independent living section of [name] and has been doing well.  Initial blood sugar noted to be 56 for which patient was given orange juice with EMS with repeat 69.  Clinical Impression  Pt admitted secondary to problem above with deficits below. Pt requiring min guard to supervision for safety with mobility using rollator. Pt lives in [place] and reports he has call bells in his room if he needs any help. Will continue to follow acutely to maximize functional mobility independence and safety.     Follow Up Recommendations Home health PT;Supervision for mobility/OOB    Equipment Recommendations  None recommended by PT    Recommendations for Other Services       Precautions / Restrictions Precautions Precautions: Fall Restrictions Weight Bearing Restrictions: No      Mobility  Bed Mobility Overal bed mobility: Needs Assistance Bed Mobility: Supine to Sit     Supine to sit: Min assist     General bed mobility comments: Min A for trunk elevation.   Transfers Overall transfer level: Needs assistance Equipment used: 4-wheeled walker Transfers: Sit to/from Stand Sit to Stand: Supervision         General transfer comment: Supervision for safety  Ambulation/Gait Ambulation/Gait assistance: Min guard Gait Distance (Feet): 120 Feet Assistive device: 4-wheeled walker Gait Pattern/deviations: Step-through pattern;Decreased stride length Gait velocity: Decreased   General Gait Details: Overall steady gait with use of rollator. min guard for safety.   Stairs            Wheelchair Mobility     Modified Rankin (Stroke Patients Only)       Balance Overall balance assessment: Needs assistance Sitting-balance support: No upper extremity supported;Feet supported Sitting balance-Leahy Scale: Fair     Standing balance support: Bilateral upper extremity supported;During functional activity Standing balance-Leahy Scale: Poor Standing balance comment: Reliant on BUE support                             Pertinent Vitals/Pain Pain Assessment: No/denies pain    Home Living Family/patient expects to be discharged to:: Private residence Living Arrangements: Alone   Type of Home: Independent living facility Home Access: [place]: One level Home Equipment: Environmental consultant - 4 wheels;Tub bench;Grab bars - tub/shower;Grab bars - toilet      Prior Function Level of Independence: Independent with assistive device(s)         Comments: Uses rollator at baseline      Hand Dominance        Extremity/Trunk Assessment   Upper Extremity Assessment Upper Extremity Assessment: Defer to OT evaluation    Lower Extremity Assessment Lower Extremity Assessment: Generalized weakness    Cervical / Trunk Assessment Cervical / Trunk Assessment: Normal  Communication   Communication: No difficulties  Cognition Arousal/Alertness: Awake/alert Behavior During Therapy: WFL for tasks assessed/performed Overall Cognitive Status: History of cognitive impairments - at baseline  General Comments      Exercises     Assessment/Plan    PT Assessment Patient needs continued PT services  PT Problem List Decreased strength;Decreased mobility;Decreased knowledge of use of DME;Decreased cognition       PT Treatment Interventions DME instruction;Gait training;Functional mobility training;Therapeutic activities;Therapeutic exercise;Balance training;Patient/family education    PT Goals (Current goals can be found  in the Care Plan section)  Acute Rehab PT Goals Patient Stated Goal: to go home PT Goal Formulation: With patient Time For Goal Achievement: 01/15/19 Potential to Achieve Goals: Good    Frequency Min 3X/week   Barriers to discharge        Co-evaluation               AM-PAC PT "6 Clicks" Mobility  Outcome Measure Help needed turning from your back to your side while in a flat bed without using bedrails?: A Little Help needed moving from lying on your back to sitting on the side of a flat bed without using bedrails?: A Little Help needed moving to and from a bed to a chair (including a wheelchair)?: A Little Help needed standing up from a chair using your arms (e.g., wheelchair or bedside chair)?: A Little Help needed to walk in hospital room?: A Little Help needed climbing 3-5 steps with a railing? : A Lot 6 Click Score: 17    End of Session Equipment Utilized During Treatment: Gait belt Activity Tolerance: Patient tolerated treatment well Patient left: in bed;with call bell/phone within reach;with bed alarm set Nurse Communication: Mobility status PT Visit Diagnosis: Muscle weakness (generalized) (M62.81);Other abnormalities of gait and mobility (R26.89)    Time: 4383-8184 PT Time Calculation (min) (ACUTE ONLY): 16 min   Charges:   PT Evaluation $PT Eval Low Complexity: [place], PT, DPT  Acute Rehabilitation Services  Pager: [phone] Office: [phone]   Rudean Hitt 01/01/2019, 1:44 PM

## 2019-01-02 DIAGNOSIS — I10 Essential (primary) hypertension: Secondary | ICD-10-CM | POA: Diagnosis not present

## 2019-01-02 DIAGNOSIS — G9341 Metabolic encephalopathy: Secondary | ICD-10-CM | POA: Diagnosis not present

## 2019-01-02 DIAGNOSIS — R531 Weakness: Secondary | ICD-10-CM | POA: Diagnosis not present

## 2019-01-02 DIAGNOSIS — E11649 Type 2 diabetes mellitus with hypoglycemia without coma: Secondary | ICD-10-CM | POA: Diagnosis not present

## 2019-01-02 LAB — BASIC METABOLIC PANEL
Anion gap: 7 (ref 5–15)
BUN: 11 mg/dL (ref 8–23)
CO2: 24 mmol/L (ref 22–32)
Calcium: 9 mg/dL (ref 8.9–10.3)
Chloride: 98 mmol/L (ref 98–111)
Creatinine, Ser: 0.9 mg/dL (ref 0.61–1.24)
GFR calc Af Amer: >60 mL/min (ref >60)
GFR calc non Af Amer: >60 mL/min (ref >60)
Glucose, Bld: 102 mg/dL — ABNORMAL HIGH (ref 70–99)
Potassium: 4.2 mmol/L (ref 3.5–5.1)
Sodium: 129 mmol/L — ABNORMAL LOW (ref 135–145)

## 2019-01-02 LAB — GLUCOSE, CAPILLARY
Glucose-Capillary: 87 mg/dL (ref 70–99)
Glucose-Capillary: 95 mg/dL (ref 70–99)
Glucose-Capillary: 131 mg/dL — ABNORMAL HIGH (ref 70–99)

## 2019-01-02 NOTE — Discharge Instructions (Signed)
Marguerite Olea,  During the hospital because of some confusion.  This appears to be secondary to very low blood sugar (hypoglycemia).  1 of her medications, glipizide, was the likely culprit.  I have discontinued this medication on discharge.

## 2019-01-02 NOTE — Discharge Summary (Signed)
Physician Discharge Summary  Willie [name] ZOX:[id number] DOB: 1941/05/25 DOA: 12/31/2018  PCP: Josetta Huddle, MD  Admit date: 12/31/2018 Discharge date: 01/02/2019  Admitted From: ILF Disposition: ILF  Recommendations for Outpatient Follow-up:  1. Follow up with PCP in 1 week 2. Please obtain BMP/CBC in one week 3. Please follow up on the following pending results: None  Home Health: PT, OT Equipment/Devices: None  Discharge Condition: Stable CODE STATUS: Full code Diet recommendation: Heart healthy   Brief/Interim Summary:  Admission HPI written by Norval Morton, MD   HPI: Willie [name] is a 78 y.o. male with medical history significant of vascular dementia, CVA with residual right-sided weakness, diabetes mellitus type 2, and hypothyroidism; who presents after found him to be confused and weak. He has been in the independent living section of [name] and has been doing well.  Patient reports utilizing a walker to ambulate.  He denies having any formal complaints at this time.  Initial blood sugar noted to be 56 for which patient was given orange juice with EMS with repeat 69.  Patient was thereafter given 12.5 g of D50 in route. Thyroid medication just recently lowered from 150 mcg to 135 mcg. He states that [name] is killing him.   Daughter-in-law states he has last 30 pounds over the last few weeks.  She reports that he exercises, but she is concerned that he is forgetting to eat.  She is concerned as he has been impulsive and has been looking for apartments nearby that are not necessarily safe.  He even hired movers to move his stuff to his mother's house which is not inhabitable.  ED Course: Upon admission into the emergency department patient was noted to be afebrile, pulse 59-65, and all other vital signs maintained.  Labs revealed WBC 5.5, hemoglobin 10.9, sodium 125, chloride 94, BUN 17, creatinine 1.26, and free T4 1.21.  Urinalysis was negative for  any acute signs of infection.   Hospital course:  Acute metabolic encephalopathy Secondary to hypoglycemia.  This is resolved.  Hypoglycemia In setting of sulfonylurea use as an outpatient.  Patient managed with D5 fluids with improvement of blood sugars.  This is been discontinued prior to discharge.   Hyponatremia Acute on chronic.  Sodium is mildly decreased from baseline on admission at 125.  Continued patient's home regimen of salt tabs in addition to IV fluids.  Sodium improved to 129 and has been stable.  Continue home regimen on discharge.  Diabetes mellitus, type II Continue home metformin but will discontinue glipizide.  CKD stage II Patient did not meet criteria for acute kidney injury.  Generalized weakness/weight loss Likely related to dementia.  Patient may benefit from palliative care referral as an outpatient.  Essential hypertension Continue home amlodipine, metoprolol, Diovan  Normocytic anemia Stable  Vascular dementia At baseline.  Continue home regimen  Hyperlipidemia Continue atorvastatin  BPH Continue home finasteride   Discharge Diagnoses:  Principal Problem:   Acute metabolic encephalopathy Active Problems:   Controlled type 2 diabetes mellitus with hypoglycemia (HCC)   Hypothyroidism   Normocytic normochromic anemia   Acute kidney injury superimposed on CKD (HCC)   Hyponatremia   Essential hypertension   Generalized weakness    Discharge Instructions  Discharge Instructions    Diet - low sodium heart healthy   Complete by: As directed    Increase activity slowly   Complete by: As directed      Allergies as of 01/02/2019  Reactions   Aspirin Other (See Comments)   Had stroke in March last year. No more aspirin.      Medication List    STOP taking these medications   glipiZIDE 5 MG tablet Commonly known as: GLUCOTROL     TAKE these medications   amLODipine 5 MG tablet Commonly known as: NORVASC Take 5 mg by mouth  every morning.   atorvastatin 10 MG tablet Commonly known as: LIPITOR Take 10 mg by mouth every evening.   finasteride 5 MG tablet Commonly known as: PROSCAR Take 5 mg by mouth every morning.   levothyroxine 137 MCG tablet Commonly known as: SYNTHROID Take 137 mcg by mouth daily before breakfast.   Lubricant Eye Oint Place 1 application into the right eye at bedtime. Attempt to tape right eye closed after administering lubricant. If unable to tape closed. Use eye patch.   metFORMIN 500 MG tablet Commonly known as: GLUCOPHAGE Take 250 mg by mouth 2 (two) times daily with a meal.   metoprolol tartrate 50 MG tablet Commonly known as: LOPRESSOR Take 50 mg by mouth daily.   Multivitamin Adults Tabs Take 1 tablet by mouth daily.   Myrbetriq 50 MG Tb24 tablet Generic drug: mirabegron ER Take 50 mg by mouth every evening.   Namzaric 28-10 MG Cp24 Generic drug: Memantine HCl-Donepezil HCl Take 1 capsule by mouth daily.   sennosides-docusate sodium 8.6-50 MG tablet Commonly known as: SENOKOT-S Take 1 tablet by mouth every morning.   sodium chloride 1 g tablet Take 1 g by mouth daily.   solifenacin 10 MG tablet Commonly known as: VESICARE Take 10 mg by mouth every evening.   valsartan 80 MG tablet Commonly known as: DIOVAN Take 80 mg by mouth every evening.   Vitamin E 400 units Tabs Take 1 tablet by mouth daily.      Follow-up Information    [facility] Follow up.   Specialty: Home Health Services Why: Home Health resumed.  Contact information: [address] [place] [address] [phone]        Josetta Huddle, MD. Schedule an appointment as soon as possible for a visit in 1 week(s).   Specialty: Internal Medicine Why: Hospital follow-up Contact information: [address] [place] [address] [phone]          Allergies  Allergen Reactions  . Aspirin Other (See Comments)    Had stroke in March last year. No  more aspirin.    Consultations:  None   Procedures/Studies: Ct Head Wo Contrast  Result Date: 12/28/2018 CLINICAL DATA:  Confusion/altered level of consciousness. EXAM: CT HEAD WITHOUT CONTRAST TECHNIQUE: Contiguous axial images were obtained from the base of the skull through the vertex without intravenous contrast. COMPARISON:  December 11, 2018 FINDINGS: Brain: Small remote lacunar infarct in the left thalamus, image 15/2, no change from prior. Otherwise, the brainstem, cerebellum, cerebral peduncles, thalamus, basal ganglia, basilar cisterns, and ventricular system appear within normal limits. Periventricular white matter and corona radiata hypodensities favor chronic ischemic microvascular white matter disease. No intracranial hemorrhage, mass lesion, or acute CVA. Vascular: There is atherosclerotic calcification of the cavernous carotid arteries bilaterally. Skull: Unremarkable Sinuses/Orbits: Unremarkable Other: No supplemental non-categorized findings. IMPRESSION: 1. No acute intracranial findings. 2. Periventricular white matter and corona radiata hypodensities favor chronic ischemic microvascular white matter disease. 3. 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 Electronically Signed   By: Van Clines M.D.   On: 12/28/2018 13:39  Ct Head Wo Contrast  Result Date: 12/11/2018 CLINICAL DATA:  Found unresponsive. EXAM: CT HEAD WITHOUT CONTRAST TECHNIQUE: Contiguous axial images were obtained from the base of the skull through the vertex without intravenous contrast. COMPARISON:  Head CT 01/15/2011 FINDINGS: Brain: Stable age related cerebral atrophy, ventriculomegaly and periventricular white matter disease. No extra-axial fluid collections are identified. No CT findings for acute hemispheric infarction or intracranial hemorrhage. No mass lesions. The brainstem and cerebellum are normal. Vascular: Vascular calcifications but no aneurysm or hyperdense vessels. Skull: No skull fracture  or bone lesion. Sinuses/Orbits: The paranasal sinuses and mastoid air cells are clear. The globes are intact. Other: No scalp lesions or scalp hematoma. IMPRESSION: Age related cerebral atrophy, ventriculomegaly and periventricular white matter disease but no acute intracranial findings. Electronically Signed   By: Marijo Sanes M.D.   On: 12/11/2018 12:33      Subjective: No issues today.  Discharge Exam: Vitals:   01/02/19 0447 01/02/19 0901  BP: (!) 154/76 (!) 120/51  Pulse: 64 81  Resp:  16  Temp: 98.1 F (36.7 C) 98.2 F (36.8 C)  SpO2: 97% 99%   Vitals:   01/01/19 1355 01/01/19 2021 01/02/19 0447 01/02/19 0901  BP: 139/71 116/62 (!) 154/76 (!) 120/51  Pulse: 61 61 64 81  Resp: 16   16  Temp: (!) 97.5 F (36.4 C) 97.9 F (36.6 C) 98.1 F (36.7 C) 98.2 F (36.8 C)  TempSrc: Oral Oral Oral Oral  SpO2: 98% 100% 97% 99%  Weight:      Height:        General: Pt is alert, awake, not in acute distress Cardiovascular: RRR, S1/S2 +, no rubs, no gallops Respiratory: CTA bilaterally, no wheezing, no rhonchi Abdominal: Soft, NT, ND, bowel sounds + Extremities: no edema, no cyanosis    The results of significant diagnostics from this hospitalization (including imaging, microbiology, ancillary and laboratory) are listed below for reference.     Microbiology: Recent Results (from the past 240 hour(s))  SARS Coronavirus 2 (CEPHEID - Performed in [facility] hospital lab), Hosp Order     Status: None   Collection Time: 12/31/18  1:40 PM   Specimen: Nasopharyngeal Swab  Result Value Ref Range Status   SARS Coronavirus 2 NEGATIVE NEGATIVE Final    Comment: (NOTE) If result is NEGATIVE SARS-CoV-2 target nucleic acids are NOT DETECTED. The SARS-CoV-2 RNA is generally detectable in upper and lower  respiratory specimens during the acute phase of infection. The lowest  concentration of SARS-CoV-2 viral copies this assay can detect is 250  copies / mL. A negative result does  not preclude SARS-CoV-2 infection  and should not be used as the sole basis for treatment or other  patient management decisions.  A negative result may occur with  improper specimen collection / handling, submission of specimen other  than nasopharyngeal swab, presence of viral mutation(s) within the  areas targeted by this assay, and inadequate number of viral copies  (<250 copies / mL). A negative result must be combined with clinical  observations, patient history, and epidemiological information. If result is POSITIVE SARS-CoV-2 target nucleic acids are DETECTED. The SARS-CoV-2 RNA is generally detectable in upper and lower  respiratory specimens dur ing the acute phase of infection.  Positive  results are indicative of active infection with SARS-CoV-2.  Clinical  correlation with patient history and other diagnostic information is  necessary to determine patient infection status.  Positive results do  not rule out bacterial infection or  co-infection with other viruses. If result is PRESUMPTIVE POSTIVE SARS-CoV-2 nucleic acids MAY BE PRESENT.   A presumptive positive result was obtained on the submitted specimen  and confirmed on repeat testing.  While 2019 novel coronavirus  (SARS-CoV-2) nucleic acids may be present in the submitted sample  additional confirmatory testing may be necessary for epidemiological  and / or clinical management purposes  to differentiate between  SARS-CoV-2 and other Sarbecovirus currently known to infect humans.  If clinically indicated additional testing with an alternate test  methodology [phone]) is advised. The SARS-CoV-2 RNA is generally  detectable in upper and lower respiratory sp ecimens during the acute  phase of infection. The expected result is Negative. Fact Sheet for Patients:  StrictlyIdeas.no Fact Sheet for Healthcare Providers: BankingDealers.co.za This test is not yet approved or  cleared by the [place] FDA and has been authorized for detection and/or diagnosis of SARS-CoV-2 by FDA under an Emergency Use Authorization (EUA).  This EUA will remain in effect (meaning this test can be used) for the duration of the COVID-19 declaration under Section 564(b)(1) of the Act, 21 U.S.C. section 360bbb-3(b)(1), unless the authorization is terminated or revoked sooner. Performed at [facility], [place] [address] [place], [place] [address]      Labs: BNP (last 3 results) No results for input(s): BNP in the last 8760 hours. Basic Metabolic Panel: Recent Labs  Lab 12/31/18 1440 12/31/18 2053 01/01/19 0315 01/01/19 1119 01/02/19 0236  NA 127* 130* 129* 129* 129*  K 4.6 5.1 4.2 4.3 4.2  CL 97* 97* 98 98 98  CO2 20* 25 24 24 24   GLUCOSE 74 144* 90 115* 102*  BUN 16 14 14 12 11   CREATININE 1.14 1.04 1.02 0.92 0.90  CALCIUM 8.9 9.2 8.7* 8.8* 9.0   Liver Function Tests: Recent Labs  Lab 12/28/18 1254 12/31/18 1205  AST 22 19  ALT 14 11  ALKPHOS 93 66  BILITOT 0.4 0.5  PROT 7.5 6.2*  ALBUMIN 4.3 3.3*   No results for input(s): LIPASE, AMYLASE in the last 168 hours. No results for input(s): AMMONIA in the last 168 hours. CBC: Recent Labs  Lab 12/28/18 1254 12/31/18 1205 01/01/19 0315  WBC 7.1 5.5 8.7  NEUTROABS 3.9  --   --   HGB 12.0* 10.9* 10.2*  HCT 36.1* 32.5* 29.2*  MCV 96.8 95.6 92.1  PLT 322 296 291   Cardiac Enzymes: No results for input(s): CKTOTAL, CKMB, CKMBINDEX, TROPONINI in the last 168 hours. BNP: Invalid input(s): POCBNP CBG: Recent Labs  Lab 01/01/19 1628 01/01/19 2018 01/02/19 0445 01/02/19 0744 01/02/19 1145  GLUCAP 124* 130* 87 95 131*   D-Dimer No results for input(s): DDIMER in the last 72 hours. Hgb A1c No results for input(s): HGBA1C in the last 72 hours. Lipid Profile No results for input(s): CHOL, HDL, LDLCALC, TRIG, CHOLHDL, LDLDIRECT in the last 72 hours. Thyroid function studies Recent Labs     12/31/18 1150  TSH 1.588   Anemia work up No results for input(s): VITAMINB12, FOLATE, FERRITIN, TIBC, IRON, RETICCTPCT in the last 72 hours. Urinalysis    Component Value Date/Time   COLORURINE YELLOW 12/31/2018 1310   APPEARANCEUR CLEAR 12/31/2018 1310   LABSPEC 1.008 12/31/2018 1310   PHURINE 7.0 12/31/2018 1310   GLUCOSEU NEGATIVE 12/31/2018 1310   HGBUR NEGATIVE 12/31/2018 1310   BILIRUBINUR NEGATIVE 12/31/2018 1310   KETONESUR NEGATIVE 12/31/2018 1310   PROTEINUR NEGATIVE 12/31/2018 1310   UROBILINOGEN 0.2 06/06/2011 1803  NITRITE NEGATIVE 12/31/2018 1310   LEUKOCYTESUR NEGATIVE 12/31/2018 1310   Sepsis Labs Invalid input(s): PROCALCITONIN,  WBC,  LACTICIDVEN Microbiology Recent Results (from the past 240 hour(s))  SARS Coronavirus 2 (CEPHEID - Performed in [place] hospital lab), Hosp Order     Status: None   Collection Time: 12/31/18  1:40 PM   Specimen: Nasopharyngeal Swab  Result Value Ref Range Status   SARS Coronavirus 2 NEGATIVE NEGATIVE Final    Comment: (NOTE) If result is NEGATIVE SARS-CoV-2 target nucleic acids are NOT DETECTED. The SARS-CoV-2 RNA is generally detectable in upper and lower  respiratory specimens during the acute phase of infection. The lowest  concentration of SARS-CoV-2 viral copies this assay can detect is 250  copies / mL. A negative result does not preclude SARS-CoV-2 infection  and should not be used as the sole basis for treatment or other  patient management decisions.  A negative result may occur with  improper specimen collection / handling, submission of specimen other  than nasopharyngeal swab, presence of viral mutation(s) within the  areas targeted by this assay, and inadequate number of viral copies  (<250 copies / mL). A negative result must be combined with clinical  observations, patient history, and epidemiological information. If result is POSITIVE SARS-CoV-2 target nucleic acids are DETECTED. The SARS-CoV-2 RNA  is generally detectable in upper and lower  respiratory specimens dur ing the acute phase of infection.  Positive  results are indicative of active infection with SARS-CoV-2.  Clinical  correlation with patient history and other diagnostic information is  necessary to determine patient infection status.  Positive results do  not rule out bacterial infection or co-infection with other viruses. If result is PRESUMPTIVE POSTIVE SARS-CoV-2 nucleic acids MAY BE PRESENT.   A presumptive positive result was obtained on the submitted specimen  and confirmed on repeat testing.  While 2019 novel coronavirus  (SARS-CoV-2) nucleic acids may be present in the submitted sample  additional confirmatory testing may be necessary for epidemiological  and / or clinical management purposes  to differentiate between  SARS-CoV-2 and other Sarbecovirus currently known to infect humans.  If clinically indicated additional testing with an alternate test  methodology [phone]) is advised. The SARS-CoV-2 RNA is generally  detectable in upper and lower respiratory sp ecimens during the acute  phase of infection. The expected result is Negative. Fact Sheet for Patients:  StrictlyIdeas.no Fact Sheet for Healthcare Providers: BankingDealers.co.za This test is not yet approved or cleared by the [place] FDA and has been authorized for detection and/or diagnosis of SARS-CoV-2 by FDA under an Emergency Use Authorization (EUA).  This EUA will remain in effect (meaning this test can be used) for the duration of the COVID-19 declaration under Section 564(b)(1) of the Act, 21 U.S.C. section 360bbb-3(b)(1), unless the authorization is terminated or revoked sooner. Performed at [facility], [place] [address] [place], [place] [address]      SIGNED:   Cordelia Poche, MD [facility] 01/02/2019, 1:38 PM

## 2019-01-02 NOTE — Progress Notes (Signed)
Marguerite Olea to be D/C'd home with home health per MD order. Discussed with the patient and all questions fully answered. Went over AVS on phone with daughter in law, [name]. VVS, Skin clean, dry and intact without evidence of skin break down, no evidence of skin tears noted.  IV catheter discontinued intact by Advertising copywriter. Site without signs and symptoms of complications. Dressing and pressure applied.  An After Visit Summary was printed and given to the patient.  Patient escorted via [place], and D/C home via private auto.  Melonie [place]  01/02/2019 5:31 PM

## 2019-01-02 NOTE — TOC Transition Note (Signed)
Transition of Care [facility]) - CM/SW Discharge Note   Patient Details  Name: MISHAEL HARAN MRN: [id number] Date of Birth: September 10, 1940  Transition of Care [facility]) CM/SW Contact:  Benard Halsted, LCSW Phone Number: 01/02/2019, 2:54 PM   Clinical Narrative:    CSW spoke with patient and he is aware he is discharging home today. CSW alerted [facility].    Final next level of care: Home w Home Health Services Barriers to Discharge: No Barriers Identified   Patient Goals and CMS Choice Patient states their goals for this hospitalization and ongoing recovery are:: Return home   Choice offered to / list presented to : NA  Discharge Placement                Patient to be transferred to facility by: Car Name of family member notified: Daughter in Sports coach, [name] Patient and family notified of of transfer: 01/02/19  Discharge Plan and Services In-house Referral: Clinical Social Work Discharge Planning Services: [employer] Consult Post Acute Care Choice: Home Health          DME Arranged: N/A         HH Arranged: RN, PT, OT [place] Agency: [facility] [employer] Date [place]: 01/01/19 Time [place]: [place] Representative spoke with at [place]: [place]  Social Determinants of Health ([place]) Interventions     Readmission Risk Interventions No flowsheet data found.

## 2019-01-15 ENCOUNTER — Encounter: Payer: Self-pay | Admitting: *Deleted

## 2019-01-20 ENCOUNTER — Encounter: Payer: Self-pay | Admitting: Radiation Oncology

## 2019-01-20 NOTE — Progress Notes (Addendum)
GU Location of Tumor / Histology: prostatic adenocarcinoma  If Prostate Cancer, Gleason Score is (4 + 3) and PSA is (8.22) on 11/2018. Prostate volume: 22 cc.  Marguerite Olea was told by his PCP a couple years ago that his PSA was elevated. He was referred to Dr. Karsten Ro who did a biopsy then a repeat biopsy one week ago.   Biopsies of prostate (if applicable) revealed:    Past/Anticipated interventions by urology, if any: prostate biopsy, prostate biopsy, referral for consideration of radiotherapy, prescribed finasteride and vesicare, administered Lupron 01/17/2019  Past/Anticipated interventions by medical oncology, if any: no  Weight changes, if any: no  Bowel/Bladder complaints, if any: IPSS 14. SHIM 1. Reports nocturia x 2. Reports urinary frequency and urgency. Reports occasional urinary leakage associated with urgency. Reports some urinary hesitancy and intermittency. Reports intermittent dysuria. Denies hematuria.   Nausea/Vomiting, if any: no  Pain issues, if any:  Denies bone pain.  SAFETY ISSUES:  Prior radiation? no  Pacemaker/ICD? no  Possible current pregnancy? no, male patient  Is the patient on methotrexate? no  Current Complaints / other details:  78 year old male. Previous CVA. NKDA. Divorced. Brother with prostate ca and another brother with colon ca. Patient was moved into [facility] just yesterday. Daughter in law, Muaad Boehning, assisted greatly with the consult.

## 2019-01-21 ENCOUNTER — Ambulatory Visit
Admission: RE | Admit: 2019-01-21 | Discharge: 2019-01-21 | Disposition: A | Discharge status: Home / Self Care | Payer: Medicare Other | Source: Ambulatory Visit | Attending: Radiation Oncology | Admitting: Radiation Oncology

## 2019-01-21 ENCOUNTER — Other Ambulatory Visit: Payer: Self-pay

## 2019-01-21 ENCOUNTER — Encounter: Payer: Self-pay | Admitting: Radiation Oncology

## 2019-01-21 VITALS — Ht 72.0 in | Wt 156.0 lb

## 2019-01-21 DIAGNOSIS — C61 Malignant neoplasm of prostate: Secondary | ICD-10-CM

## 2019-01-21 HISTORY — DX: Malignant neoplasm of prostate: C61

## 2019-01-21 NOTE — Progress Notes (Signed)
See progress note under physician encounter. 

## 2019-01-21 NOTE — Progress Notes (Signed)
Radiation Oncology         [phone] ________________________________  Initial Outpatient Consultation - Conducted via telephone due to current COVID-19 concerns for limiting Willie [name] exposure  Name: Willie [name] MRN: [id number]  Date: 01/21/2019  DOB: 07-23-1940  XT:AVWPV, Herbie [place], MD  Kathie Rhodes, MD   REFERRING PHYSICIAN: Kathie Rhodes, MD  DIAGNOSIS: 78 y.o. gentleman with Stage T1c adenocarcinoma of the prostate with Gleason score of 4+3, and PSA of 8.22 (corrected to 16.44 on finasteride).    ICD-10-CM   1. Malignant neoplasm of prostate ([place])  C61     HISTORY OF PRESENT ILLNESS: Willie [name] is an 78 y.o. male with a diagnosis of prostate cancer.  He was initially diagnosed with Gleason 6 disease in March 2017 when he was noted to have an elevated PSA of 5.42 and some nodularity in the right base on DRE. TRUS biopsy on 08/12/2015 revealed Gleason 3+3 in 2/12 cores from the left apex. He was started on finasteride for LUTS and has been under active surveillance since that time. A surveillance prostate MRI on 05/27/2018 showed an 8 mm low T2 lesion in the left posterolateral mid gland to apex, likely corresponding to the Willie [name]'s known low-grade macroscopic prostate cancer (PI-RADS 2). There was also a 15 mm irregular lesion along the left anterior aspect of the central gland at the apex, abutting/transgressing the anterior fibromuscular stroma, suspicious for central gland tumor (PI-RADS 3). No evidence of seminal vesicle invasion, pelvic lymphadenopathy, or metastatic disease. His PSA had remained stable between 3.5 - 5.5 until 08/2018 when his PSA was noted elevated further at 6.54 (13.08 when corrected for finasteride). A repeat PSA on 11/19/18 was further elevated to 8.08 (16.16 adjusted for finasteride) and increased to 8.22 (corrected to 16.44 on finasteride) when repeated on 12/02/2018. Digital rectal examination was performed by Dr. Karsten Ro on 12/05/2018 revealing a 10 mm nodule in  the right base, fairly rubbery in consistency.  The Willie [name] proceeded to transrectal ultrasound with 12 biopsies of the prostate on 01/07/2019.  The prostate volume measured 22.14 cc.  Out of 12 core biopsies, 3 were positive.  The maximum Gleason score was 4+3, and this was seen in left apex lateral and left apex. Additionally, Gleason 3+4 disease was seen in the left mid lateral.  Biopsies of prostate revealed:    The Willie [name] reviewed the biopsy results with his urologist and he has kindly been referred today for discussion of potential radiation treatment options. He received a Lupron injection on 01/14/2019 for ADT.   PREVIOUS RADIATION THERAPY: No  PAST MEDICAL HISTORY:  Past Medical History:  Diagnosis Date   Alcohol abuse    Benign prostatic hypertrophy    Dementia (HCC)    daughter-in-law does not know of this    Diabetes mellitus    Encephalopathy    GERD (gastroesophageal reflux disease)    History of tarsorrhaphy    right eye   Hypertension    Hypothyroidism    Pneumonia    Prostate cancer ([place])    Stroke ([place])    right eye does not shut all the way-tarsorrhaphy      PAST SURGICAL HISTORY: Past Surgical History:  Procedure Laterality Date   COLONOSCOPY WITH PROPOFOL N/A 08/24/2014   Procedure: COLONOSCOPY WITH PROPOFOL;  Surgeon: Garlan Fair, MD;  Location: [facility];  Service: Endoscopy;  Laterality: N/A;   EYE SURGERY     Laser eye surgery   PROSTATE BIOPSY      FAMILY  HISTORY:  Family History  Problem Relation Age of Onset   Prostate cancer Brother    Colon cancer Brother    Breast cancer Neg Hx    Pancreatic cancer Neg Hx     SOCIAL HISTORY:  Social History   Socioeconomic History   Marital status: Divorced    Spouse name: Not on file   Number of children: 2   Years of education: Not on file   Highest education level: Not on file  Occupational History   Not on file  Social Needs   Financial resource strain: Not  on file   Food insecurity    Worry: Not on file    Inability: Not on file   Transportation needs    Medical: Not on file    Non-medical: Not on file  Tobacco Use   Smoking status: Never Smoker   Smokeless tobacco: Never Used  Substance and Sexual Activity   Alcohol use: Not Currently   Drug use: No   Sexual activity: Not Currently  Lifestyle   Physical activity    Days per week: Not on file    Minutes per session: Not on file   Stress: Not on file  Relationships   Social connections    Talks on phone: Not on file    Gets together: Not on file    Attends religious service: Not on file    Active member of club or organization: Not on file    Attends meetings of clubs or organizations: Not on file    Relationship status: Not on file   Intimate partner violence    Fear of current or ex partner: Not on file    Emotionally abused: Not on file    Physically abused: Not on file    Forced sexual activity: Not on file  Other Topics Concern   Not on file  Social History Narrative   Willie [name] resides at [facility] in [place].    ALLERGIES: Aspirin  MEDICATIONS:  Current Outpatient Medications  Medication Sig Dispense Refill   amLODipine (NORVASC) 5 MG tablet Take 5 mg by mouth every morning.     Artificial Tear Ointment (LUBRICANT EYE) OINT Place 1 application into the right eye at bedtime. Attempt to tape right eye closed after administering lubricant. If unable to tape closed. Use eye patch.     atorvastatin (LIPITOR) 10 MG tablet Take 10 mg by mouth every evening.     Blood Glucose Monitoring Suppl (ONETOUCH VERIO) w/Device KIT      calcium carbonate (OS-CAL - DOSED IN MG OF ELEMENTAL CALCIUM) 1250 (500 Ca) MG tablet Take 1 tablet by mouth.     cholecalciferol (VITAMIN D3) 25 MCG (1000 UT) tablet Take 1,000 Units by mouth daily.     finasteride (PROSCAR) 5 MG tablet Take 5 mg by mouth every morning.     irbesartan (AVAPRO) 150 MG tablet Take by  mouth.     Lancets (ONETOUCH DELICA PLUS [id number]) MISC      levothyroxine (SYNTHROID) 137 MCG tablet Take 137 mcg by mouth daily before breakfast.     Memantine HCl-Donepezil HCl (NAMZARIC) 28-10 MG CP24 Take 1 capsule by mouth daily.     metFORMIN (GLUCOPHAGE) 500 MG tablet Take 250 mg by mouth 2 (two) times daily with a meal.      metoprolol tartrate (LOPRESSOR) 50 MG tablet Take 50 mg by mouth daily.      mirabegron ER (MYRBETRIQ) 50 MG TB24 tablet Take 50 mg by mouth  every evening.     Multiple Vitamins-Minerals (MULTIVITAMIN ADULTS) TABS Take 1 tablet by mouth daily.     ONETOUCH VERIO test strip      sennosides-docusate sodium (SENOKOT-S) 8.6-50 MG tablet Take 1 tablet by mouth every morning.     sodium chloride 1 g tablet Take 1 g by mouth daily.     solifenacin (VESICARE) 10 MG tablet Take 10 mg by mouth every evening.      Vitamin E 400 units TABS Take 1 tablet by mouth daily.     No current facility-administered medications for this encounter.     REVIEW OF SYSTEMS:  On review of systems, the Willie [name] reports that he is doing well overall. He denies any chest pain, shortness of breath, cough, fevers, chills, night sweats, unintended weight changes. He denies any bowel disturbances, and denies abdominal pain, nausea or vomiting. He denies any new musculoskeletal or joint aches or pains. His IPSS was 14, indicating moderate urinary symptoms with frequency, urgency with occasional leakage, hesitancy, intermittency, and nocturia x2. His voiding symptoms are improved with Vesicare and Myrbetriq in addition to Finasteride daily. He reports intermittent dysuria but denies gross hematuria. His SHIM was 1, indicating he does have severe erectile dysfunction. A complete review of systems is obtained and is otherwise negative.  PHYSICAL EXAM: Not performed in light of telephone consultation platform.  Wt Readings from Last 3 Encounters:  01/21/19 156 lb (70.8 kg)  12/31/18 160 lb  (72.6 kg)  12/11/18 165 lb (74.8 kg)   Temp Readings from Last 3 Encounters:  01/02/19 97.7 F (36.5 C) (Oral)  12/28/18 98.2 F (36.8 C) (Oral)  12/11/18 97.6 F (36.4 C) (Oral)   BP Readings from Last 3 Encounters:  01/02/19 (!) 159/66  12/28/18 (!) 149/96  12/11/18 (!) 118/59   Pulse Readings from Last 3 Encounters:  01/02/19 (!) 55  12/28/18 64  12/11/18 65   Pain Assessment Pain Score: 0-No pain/10   LABORATORY DATA:  Lab Results  Component Value Date   WBC 8.7 01/01/2019   HGB 10.2 (L) 01/01/2019   HCT 29.2 (L) 01/01/2019   MCV 92.1 01/01/2019   PLT 291 01/01/2019   Lab Results  Component Value Date   NA 129 (L) 01/02/2019   K 4.2 01/02/2019   CL 98 01/02/2019   CO2 24 01/02/2019   Lab Results  Component Value Date   ALT 11 12/31/2018   AST 19 12/31/2018   ALKPHOS 66 12/31/2018   BILITOT 0.5 12/31/2018     RADIOGRAPHY: Ct Head Wo Contrast  Result Date: 12/28/2018 CLINICAL DATA:  Confusion/altered level of consciousness. EXAM: CT HEAD WITHOUT CONTRAST TECHNIQUE: Contiguous axial images were obtained from the base of the skull through the vertex without intravenous contrast. COMPARISON:  December 11, 2018 FINDINGS: Brain: Small remote lacunar infarct in the left thalamus, image 15/2, no change from prior. Otherwise, the brainstem, cerebellum, cerebral peduncles, thalamus, basal ganglia, basilar cisterns, and ventricular system appear within normal limits. Periventricular white matter and corona radiata hypodensities favor chronic ischemic microvascular white matter disease. No intracranial hemorrhage, mass lesion, or acute CVA. Vascular: There is atherosclerotic calcification of the cavernous carotid arteries bilaterally. Skull: Unremarkable Sinuses/Orbits: Unremarkable Other: No supplemental non-categorized findings. IMPRESSION: 1. No acute intracranial findings. 2. Periventricular white matter and corona radiata hypodensities favor chronic ischemic microvascular  white matter disease. 3. 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 Electronically Signed   By: Van Clines M.D.   On: 12/28/2018 13:39  IMPRESSION/PLAN: 1. 78 y.o. gentleman with Stage T1c adenocarcinoma of the prostate with Gleason Score of 4+3, and PSA of 8.22 (corrected to 16.44 on finasteride). We discussed the Willie [name]'s workup and outlined the nature of prostate cancer in this setting. The Willie [name]'s T stage, Gleason's score, and PSA put him into the unfavorable-intermediate risk group. Accordingly, he is eligible for 5.5 weeks of external radiation or brachytherapy. We discussed the available radiation techniques, and focused on the details and logistics of delivery. We explained that he is not an ideal candidate for brachytherapy given his persistent LUTS despite multimodal therapy with Myrbetriq and Finasteride. We discussed and outlined the risks, benefits, short and long-term effects associated with radiotherapy. We discussed the role of SpaceOAR in reducing the rectal toxicity associated with radiotherapy. We also detailed the role of ADT in the treatment of unfavorable-intermediate risk prostate cancer and outlined the associated side effects that could be expected with this therapy.  At the conclusion of our conversation the Willie [name] is interested in moving forward with 5.5 weeks of external beam therapy in combination with ST-ADT. He has received his first Lupron injection on 01/14/2019. We will contact [facility] to make arrangements for fiducial markers with SpaceOAR gel placement to reduce rectal toxicity from radiotherapy placement prior to CT simulation. We will share our discussion with Dr. Karsten Ro and move forward with treatment planning in anticipation of beginning IMRT in mid-October.  Given current concerns for Willie [name] exposure during the COVID-19 pandemic, this encounter was conducted via telephone. The Willie [name] was notified in advance and was offered a [place]  meeting to allow for face to face communication but unfortunately reported that he did not have the appropriate resources/technology to support such a visit and instead preferred to proceed with telephone consult. The Willie [name] has given verbal consent for this type of encounter. The time spent during this encounter was 50 minutes. The attendants for this meeting include Tyler Pita MD, Ashlyn Bruning PA-C, [place], Willie [name], Willie [name] and his daughter-in-law, [name]. During the encounter, Tyler Pita MD, Ashlyn Bruning PA-C, and scribe, Rae Lips were located at [facility] Radiation Oncology Department.  Willie [name], Willie [name] and daughter-in-law were located at home.    Nicholos Johns, PA-C    Tyler Pita, MD  [facility] Direct Dial: [phone]   Fax: [phone] [email or web address]   Skype   LinkedIn  This document serves as a record of services personally performed by Tyler Pita, MD and Freeman Caldron, PA-C. It was created on their behalf by Rae Lips, a trained medical scribe. The creation of this record is based on the scribe's personal observations and the providers' statements to them. This document has been checked and approved by the attending providers.

## 2019-02-11 ENCOUNTER — Telehealth: Payer: Self-pay | Admitting: Medical Oncology

## 2019-02-11 NOTE — Telephone Encounter (Signed)
Spoke with Willie [name] to introduce myself as the prostate nurse navigator and discuss my role. I also spoke with his daughter- in- law, [name]. I was unable to meet them the day he consulted with Dr. Tammi Klippel. He received Lupron 45 mg 8/7 and has not experienced any side effects. He states he is not sleeping as well but has recently relocated to assisted living and getting adjusted to a new home. I discussed how he will get fiducial markers/SpaceOAR placed at Dr. Simone Curia office and then he will be scheduled for radiation planning. I gave them my office phone number and asked them to call me with questions or concerns. They voiced understanding of the above.

## 2019-03-13 ENCOUNTER — Telehealth: Payer: Self-pay | Admitting: *Deleted

## 2019-03-13 NOTE — Telephone Encounter (Signed)
Called patient's daughter, [name] to give an update, spoke with [name].

## 2019-03-18 ENCOUNTER — Other Ambulatory Visit: Payer: Self-pay | Admitting: Urology

## 2019-03-18 DIAGNOSIS — C61 Malignant neoplasm of prostate: Secondary | ICD-10-CM

## 2019-03-18 NOTE — Progress Notes (Signed)
Left message with Willie [name] patient must be 100 pm or later due to lives in assisted living and has to have rapid covid day of surgery.

## 2019-03-19 ENCOUNTER — Other Ambulatory Visit: Payer: Self-pay | Admitting: Urology

## 2019-03-19 ENCOUNTER — Encounter (HOSPITAL_COMMUNITY): Payer: Self-pay | Admitting: *Deleted

## 2019-03-19 ENCOUNTER — Other Ambulatory Visit: Payer: Self-pay

## 2019-03-19 NOTE — Progress Notes (Signed)
PCP - Concha [place] Cardiologist -   Chest x-ray -  EKG - 12-30-18 Stress Test -  ECHO -  Cardiac Cath -   Sleep Study -  CPAP -   Fasting Blood Sugar -  Checks Blood Sugar _____ times a day  Blood Thinner Instructions: Aspirin Instructions: Last Dose:  Anesthesia review:   Patient denies shortness of breath, fever, cough and chest pain at PAT appointment   Patient verbalized understanding of instructions that were given to them at the PAT appointment. Patient was also instructed that they will need to review over the PAT instructions again at home before surgery.

## 2019-03-20 ENCOUNTER — Other Ambulatory Visit (HOSPITAL_COMMUNITY): Payer: Self-pay | Admitting: Radiation Oncology

## 2019-03-20 DIAGNOSIS — C61 Malignant neoplasm of prostate: Secondary | ICD-10-CM

## 2019-03-20 NOTE — Progress Notes (Addendum)
Preop instructions for:     Willie [name]                    Date of Birth        06-03-2041                  Date of Procedure:    03-24-19    Doctor: Dr. Karsten Ro  Time to arrive at [facility]:10:00 AM Report to: Admitting  Procedure: Fiducial Marker Placement, Space Oar Instillation   PLEASE FOLLOW ALL PREP PER SURGEON'S INSTRUCTIONS  Do not eat or drink past midnight. AFTER MIDNIGHT THE NIGHT THE NIGHT PRIOR TO SURGERY. YOU MAY HAVE CLEAR LIQUIDS UNTIL 7;00 AM .    CLEAR LIQUID DIET   Foods Allowed                                                                           Foods Excluded  Coffee and tea, regular and decaf                             liquids that you cannot  Plain Jell-O any favor except red or purple                                           see through such as: Fruit ices (not with fruit pulp)                                     milk, soups, orange juice  Iced Popsicles                                    All     solid     food Carbonated beverages, regular and diet                                    Cranberry, grape and apple juices Sports drinks like Gatorade Lightly seasoned clear broth or consume(fat free) Sugar, honey syrup                                                               Take these morning medications only with sips of water.. Amlodipine, Finasteride,Levothryoxine, Metoprolol, Namzaric. You may also use your eyedrops   Note: No Insulin or Diabetic meds should be given or taken the morning of the procedure!   Facility contact:   [place]              Phone: [phone] or [phone]  Health Care POA: [place] [place]  Transportation contact phone#: Romy Hallmark [phone]  Please send day of procedure: Current med list and meds last taken that day, Confirm nothing by mouth status from what time, Patient Demographic info( to include DNR status, problem list, allergies)   RN contact name/phone#: Immunologist     and Fax #:[phone]  [employer] card and picture ID Leave all jewelry and other valuables at place where living( no metal or rings to be worn) No contact lens Women-no make-up, no lotions,perfumes,powders Men-no colognes,lotions  Any questions day of procedure,call  [place]-[phone]   Sent from :[facility]                   Phone:[phone]                   Fax:[phone]  Sent by :   Janyth Pupa, RN

## 2019-03-21 ENCOUNTER — Other Ambulatory Visit (HOSPITAL_COMMUNITY): Payer: Self-pay | Admitting: Urology

## 2019-03-21 ENCOUNTER — Encounter (HOSPITAL_COMMUNITY): Payer: Self-pay | Admitting: Certified Registered Nurse Anesthetist

## 2019-03-21 DIAGNOSIS — C61 Malignant neoplasm of prostate: Secondary | ICD-10-CM

## 2019-03-21 NOTE — Progress Notes (Signed)
Pre-op instructions faxed to [place] at [phone], as well as pt's daughter in law Daevin Swierk at [phone]. Pt's daughter-in-law requested copy to ensure that [place] is compliant with pre-op instructions.   Pt's daughter-in-law in receipt of instructions. All questions answered. [place] at [facility] facility, in receipt of instructions, all questions answered.

## 2019-03-23 NOTE — Discharge Instructions (Signed)
DISCHARGE INSTRUCTIONS FOR GOLD SEED IMPLANTATION  Antibiotics You may be given a prescription for an antibiotic to take when you arrive home. If so, be sure to take every tablet in the bottle, even if you are feeling better before the prescription is finished. If you begin itching, notice a rash or start to swell on your trunk, arms, legs and/or throat, immediately stop taking the antibiotic and call your Urologist. Diet Resume your usual diet when you return home. To keep your bowels moving easily and softly, drink prune, apple and cranberry juice at room temperature. You may also take a stool softener, such as Colace, which is available without prescription at local pharmacies. Daily activities ? No driving or heavy lifting for at least two days after the implant. ? No bike riding, horseback riding or riding lawn mowers for the first month after the implant. ? Any strenuous physical activity should be approved by your doctor before you resume it. Sexual relations You may resume sexual relations two weeks after the procedure. A condom should be used for the first two weeks. Your semen may be dark brown or black; this is normal and is related bleeding that may have occurred during the implant. Postoperative swelling Expect swelling and bruising of the scrotum and perineum (the area between the scrotum and anus). Both the swelling and the bruising should resolve in l or 2 weeks. Ice packs and over- the-counter medications such as Tylenol, Advil or Aleve may lessen your discomfort. Postoperative urination Most men experience burning on urination and/or urinary frequency. If this becomes bothersome, contact your Urologist.  Medication can be prescribed to relieve these problems.  It is normal to have some blood in your urine for a few days after the implant.Minette Brine your doctor for ? Temperature greater than 101 F ? Increasing pain ? Inability to urinate Follow-up  You should have follow up  with your urologist and radiation oncologist about 3 weeks after the procedure.

## 2019-03-24 ENCOUNTER — Other Ambulatory Visit (HOSPITAL_COMMUNITY): Payer: Self-pay

## 2019-03-24 ENCOUNTER — Encounter (HOSPITAL_COMMUNITY)
Admission: RE | Disposition: A | Discharge status: Other Acute Inpatient Hospital | Payer: Self-pay | Source: Home / Self Care | Attending: Urology

## 2019-03-24 ENCOUNTER — Ambulatory Visit (HOSPITAL_COMMUNITY): Admission: RE | Admit: 2019-03-24 | Payer: Medicare Other | Source: Ambulatory Visit

## 2019-03-24 ENCOUNTER — Ambulatory Visit (HOSPITAL_COMMUNITY): Payer: Medicare Other

## 2019-03-24 ENCOUNTER — Other Ambulatory Visit (HOSPITAL_COMMUNITY)
Admission: RE | Admit: 2019-03-24 | Discharge: 2019-03-24 | Disposition: A | Discharge status: Home / Self Care | Payer: Medicare Other | Source: Ambulatory Visit | Attending: Urology | Admitting: Urology

## 2019-03-24 ENCOUNTER — Encounter (HOSPITAL_COMMUNITY): Payer: Self-pay | Admitting: Emergency Medicine

## 2019-03-24 ENCOUNTER — Other Ambulatory Visit: Payer: Self-pay

## 2019-03-24 ENCOUNTER — Ambulatory Visit (HOSPITAL_COMMUNITY)
Admission: RE | Admit: 2019-03-24 | Discharge: 2019-03-24 | Payer: Medicare Other | Attending: Urology | Admitting: Urology

## 2019-03-24 DIAGNOSIS — Z20828 Contact with and (suspected) exposure to other viral communicable diseases: Secondary | ICD-10-CM | POA: Diagnosis not present

## 2019-03-24 DIAGNOSIS — Z5309 Procedure and treatment not carried out because of other contraindication: Secondary | ICD-10-CM | POA: Diagnosis not present

## 2019-03-24 DIAGNOSIS — C61 Malignant neoplasm of prostate: Secondary | ICD-10-CM | POA: Diagnosis present

## 2019-03-24 LAB — CBC
HCT: 37.3 % — ABNORMAL LOW (ref 39.0–52.0)
Hemoglobin: 11.7 g/dL — ABNORMAL LOW (ref 13.0–17.0)
MCH: 31.5 pg (ref 26.0–34.0)
MCHC: 31.4 g/dL (ref 30.0–36.0)
MCV: 100.5 fL — ABNORMAL HIGH (ref 80.0–100.0)
Platelets: 267 10*3/uL (ref 150–400)
RBC: 3.71 MIL/uL — ABNORMAL LOW (ref 4.22–5.81)
RDW: 11.9 % (ref 11.5–15.5)
WBC: 6.9 10*3/uL (ref 4.0–10.5)
nRBC: 0 % (ref 0.0–0.2)

## 2019-03-24 LAB — HEMOGLOBIN A1C
Hgb A1c MFr Bld: 6.2 % — ABNORMAL HIGH (ref 4.8–5.6)
Mean Plasma Glucose: 131.24 mg/dL

## 2019-03-24 LAB — BASIC METABOLIC PANEL
Anion gap: 8 (ref 5–15)
BUN: 28 mg/dL — ABNORMAL HIGH (ref 8–23)
CO2: 27 mmol/L (ref 22–32)
Calcium: 9.1 mg/dL (ref 8.9–10.3)
Chloride: 103 mmol/L (ref 98–111)
Creatinine, Ser: 1.12 mg/dL (ref 0.61–1.24)
GFR calc Af Amer: >60 mL/min (ref >60)
GFR calc non Af Amer: >60 mL/min (ref >60)
Glucose, Bld: 96 mg/dL (ref 70–99)
Potassium: 4.2 mmol/L (ref 3.5–5.1)
Sodium: 138 mmol/L (ref 135–145)

## 2019-03-24 LAB — SARS CORONAVIRUS 2 BY RT PCR (HOSPITAL ORDER, PERFORMED IN ~~LOC~~ HOSPITAL LAB): SARS Coronavirus 2: NEGATIVE

## 2019-03-24 LAB — GLUCOSE, CAPILLARY: Glucose-Capillary: 80 mg/dL (ref 70–99)

## 2019-03-24 SURGERY — INSERTION, GOLD SEEDS
Anesthesia: General

## 2019-03-24 MED ORDER — FENTANYL CITRATE (PF) 100 MCG/2ML IJ SOLN
INTRAMUSCULAR | Status: AC
  Filled 2019-03-24: qty 2

## 2019-03-24 MED ORDER — LIDOCAINE 2% (20 MG/ML) 5 ML SYRINGE
INTRAMUSCULAR | Status: AC
  Filled 2019-03-24: qty 5

## 2019-03-24 MED ORDER — FLEET ENEMA 7-19 GM/118ML RE ENEM
1.0000 | ENEMA | Freq: Once | RECTAL | Status: DC
  Filled 2019-03-24: qty 1

## 2019-03-24 MED ORDER — LACTATED RINGERS IV SOLN
INTRAVENOUS | Status: DC
  Administered 2019-03-24: 10:00:00 via INTRAVENOUS

## 2019-03-24 MED ORDER — PROPOFOL 10 MG/ML IV BOLUS
INTRAVENOUS | Status: AC
  Filled 2019-03-24: qty 20

## 2019-03-24 MED ORDER — SODIUM CHLORIDE 0.9 % IV SOLN
2.0000 g | Freq: Once | INTRAVENOUS | Status: DC
  Filled 2019-03-24: qty 20

## 2019-03-24 NOTE — Progress Notes (Signed)
Dr. Karsten Ro unable to adjust schedule for surgery today due to prior obligations. Patient, family and assisted living notified.

## 2019-03-24 NOTE — Progress Notes (Signed)
Dr. Jillyn Hidden notified that pt ate scrambled eggs, bacon and toast at 0730 this morning. Per Dr. Jillyn Hidden, this case will not be performed until 1330 or later. Holly at [facility] front desk informed and will relay this information to Dr. Karsten Ro.

## 2019-03-24 NOTE — Progress Notes (Signed)
Pt ambulatory to main entrance with this nurse.  Daughter in law julie picked up patient to bring back to assisted living.

## 2019-03-24 NOTE — Progress Notes (Addendum)
Spoke to [employer] from [facility] assisted living. She confirmed that pt did eat this morning.  Notified her that pt was being cancelled and will be going back to assisted living. She verbalized understanding that they would need to call Dr. Simone Curia office to reschedule. Daughter in law will be bringing him back today.

## 2019-03-24 NOTE — Progress Notes (Signed)
Daughter coming at 1145 to pick up patient to bring him back to assisted living.

## 2019-03-27 ENCOUNTER — Other Ambulatory Visit: Payer: Self-pay | Admitting: Urology

## 2019-03-28 ENCOUNTER — Ambulatory Visit: Payer: Medicare Other | Admitting: Radiation Oncology

## 2019-03-28 ENCOUNTER — Ambulatory Visit (HOSPITAL_COMMUNITY): Payer: Medicare Other

## 2019-04-10 NOTE — Progress Notes (Addendum)
DUE TO COVID-19 ONLY ONE VISITOR IS ALLOWED TO COME WITH YOU AND STAY IN THE WAITING ROOM ONLY DURING PRE OP AND PROCEDURE DAY OF SURGERY.   YOU NEED TO HAVE A COVID 19 TEST ON__Friday 11/06/2020_____ @___0840  am____, THIS TEST MUST BE DONE BEFORE SURGERY, COME  [address] [place] , [id number].  ([place]) ONCE YOUR COVID TEST IS COMPLETED, PLEASE BEGIN THE QUARANTINE INSTRUCTIONS AS OUTLINED IN YOUR HANDOUT.           Preop instructions for: Willie [name]                        Date of Birth -[birth year]                            Date of Procedure:04/18/2019        Doctor:Dr. Karsten Ro  Time to arrive at [facility]:1030  Report to: Admitting   Procedure:Gold Seed implant-space oar instillation    How to Manage Your Diabetes Before and After Surgery  Why is it important to control my blood sugar before and after surgery? . Improving blood sugar levels before and after surgery helps healing and can limit problems. . A way of improving blood sugar control is eating a healthy diet by: o  Eating less sugar and carbohydrates o  Increasing activity/exercise o  Talking with your doctor about reaching your blood sugar goals . High blood sugars (greater than 180 mg/dL) can raise your risk of infections and slow your recovery, so you will need to focus on controlling your diabetes during the weeks before surgery. . Make sure that the doctor who takes care of your diabetes knows about your planned surgery including the date and location.  How do I manage my blood sugar before surgery? . Check your blood sugar at least 4 times a day, starting 2 days before surgery, to make sure that the level is not too high or low. o Check your blood sugar the morning of your surgery when you wake up and every 2 hours until you get to the Short Stay unit. . If your blood sugar is less than 70 mg/dL, you will need to treat for low blood sugar: o Do not take  insulin. o Treat a low blood sugar (less than 70 mg/dL) with  cup of clear juice (cranberry or apple), 4 glucose tablets, OR glucose gel. o Recheck blood sugar in 15 minutes after treatment (to make sure it is greater than 70 mg/dL). If your blood sugar is not greater than 70 mg/dL on recheck, call [phone] for further instructions. . Report your blood sugar to the short stay nurse when you get to Short Stay.  . If you are admitted to the hospital after surgery: o Your blood sugar will be checked by the staff and you will probably be given insulin after surgery (instead of oral diabetes medicines) to make sure you have good blood sugar levels. o The goal for blood sugar control after surgery is 80-180 mg/dL.   WHAT DO I DO ABOUT MY DIABETES MEDICATION?        The day before surgery, Take Metformin (Glucophage ) as prescribed!  . Do not take oral diabetes medicines (pills) the morning of surgery.     Do not eat food after midnight but may have clear liquids from midnight up until 0700 am then nothing until after surgery! (To  include any tube feedings-must be discontinued)     CLEAR LIQUID DIET   Foods Allowed                                                                     Foods Excluded  Coffee and tea, regular and decaf                             liquids that you cannot  Plain Jell-O any favor except red or purple                                           see through such as: Fruit ices (not with fruit pulp)                                     milk, soups, orange juice  Iced Popsicles                                     All solid food Carbonated beverages, regular and diet                                    Cranberry, grape and apple juices Sports drinks like Gatorade Lightly seasoned clear broth or consume(fat free) Sugar, honey syrup  Sample Menu Breakfast                                Lunch                                     Supper Cranberry juice                     Beef broth                            Chicken broth Jell-O                                     Grape juice                           Apple juice Coffee or tea                        Jell-O                                      Popsicle  Coffee or tea                        Coffee or tea  _____________________________________________________________________    Take these morning medications only with sips of water.(or give through gastrostomy or feeding tube). Amlodipine (Norvasc), Atorvastatin (Lipitor), Metoprolol succinate (Toprol-XL), Levothyroxine (Synthroid), Memantine HCL-Donepezil (Namzaric), Mirabegron (Myrbetriq), use eye drops    Note: No Insulin or Diabetic meds should be given or taken the morning of the procedure!   Facility contact: Lasandra Beech ,[employer]  [phone] and FAX: [phone]  Family contact:Julie Szoke (daughter-in law) [phone]   (JULIE WILL BE BRINGING HIM TO [place] AND SURGEON TO CALL HER AFTER SURGERY)                    Health Care RY:[id number] Xzavia Feinstein (son)  Transportation contact phone#: daughter-in law to bring to [employer]  [phone]  Please send day of procedure:current med list and meds last taken that day, confirm nothing by mouth status from what time, Patient Demographic info( to include DNR status, problem list, allergies)   RN contact name/phone#:   Lasandra Beech, [employer]                          and Fax [employer] card and picture ID Leave all jewelry and other valuables at place where living( no metal or rings to be worn) No contact lens Women-no make-up, no lotions,perfumes,powders Men-no colognes,lotions  Any questions day of procedure,call  SHORT STAY-[phone]   Sent from :[facility]                   [place]                   [phone]  Sent by :  Charmaine Downs. [place] Blas ,  RN

## 2019-04-11 ENCOUNTER — Other Ambulatory Visit: Payer: Self-pay

## 2019-04-11 ENCOUNTER — Encounter (HOSPITAL_COMMUNITY): Payer: Self-pay | Admitting: *Deleted

## 2019-04-11 NOTE — Progress Notes (Addendum)
PCP - Dr. Josetta Huddle Cardiologist - none  Chest x-ray - none EKG - 12/31/2018 Stress Test - none ECHO - none Cardiac Cath -none   Sleep Study -none  CPAP - none  Fasting Blood Sugar - 83-173 Checks Blood Sugar __3-4___ times a day  Blood Thinner Instructions:none Aspirin Instructions:none Last Dose:  Anesthesia review:  Patient has a history of HTN, Diabetes Mellitus Type 2, Prostate cancer, Stroke and dementia.  Patient denies shortness of breath, fever, cough and chest pain at PAT appointment   Patient verbalized understanding of instructions that were given to them at the PAT appointment. Patient was also instructed that they will need to review over the PAT instructions again at home before surgery.

## 2019-04-11 NOTE — Progress Notes (Addendum)
Faxed pre-op instructions to [facility], med. Tech at [facility]  to review them and call me back to go over any questions about them.

## 2019-04-17 ENCOUNTER — Ambulatory Visit: Payer: Medicare Other | Admitting: Radiation Oncology

## 2019-04-17 NOTE — H&P (Signed)
HPI: Willie [name] is a 78 year-old male with prostate cancer.  His prostate cancer was diagnosed 08/12/2015. His PSA at his time of diagnosis was 5.42. His cancer was Gleason score 3+3 = 6 in 2/12 cores both from the left apex involving 5% of each of the cores..   His most recent PSA is 8.22. The patient states he is on active surveillance. He has not undergone Hormonal Therapy for treatment.   He has not recently had unwanted weight loss. He is not having new bone pain.   This condition would be considered of mild to moderate severity with no modifying factors or associated signs or symptoms other than as noted above.   Prostate cancer: A PSA that was performed on 02/22/10 was found to be 4.4. He reported that his PSA had been elevated in the past but he did not recall previous values. F/U PSA in 4/12 was normal at 3.80. Brother with prostate cancer.  TRUS/BX 08/12/15: PSA - 5.42 with nodularity noted in the base on the right hand side.  Pathology: Adenocarcinoma Gleason score 3+3 = 6 in 2/12 cores both from the left apex involving 5% of each of the cores.  Stage: T1c  Prostate MRI 05/27/18: PI-RADS 2 lesion left posterior lateral midgland apex. PI-RADS 3 lesion left anterior aspect of central gland at apex. No seminal vesicle involvement lymphadenopathy or evidence of metastatic disease.  Treatment: Active surveillance  TRUS/Bx 01/07/19: Prostate vol. - 22 cc  Pathology: 2 cores of Gleason 4 + 3 and 1 core of 3+4.  Stage: T1c      ALLERGIES: No Allergies    MEDICATIONS: Myrbetriq 50 mg tablet, extended release 24 hr 1 tablet PO Daily  Artificial Tears ointment Ophthalmic  Finasteride 5 mg tablet Oral  Glipizide 5 mg tablet Oral  Levaquin 750 mg tablet Take 1 pill the morning of your prostate biopsy.  Lipitor 20 mg tablet Oral  Losartan Potassium 50 mg tablet Oral  Lubricant Eye 1.4 % drops Ophthalmic  Metformin Hcl 500 mg tablet Oral  Metoprolol Tartrate 25 mg tablet Oral  Multi-Day  TABS Oral  Namzaric 14 mg-10 mg capsule sprinkle, extended release 24 hr Oral  Norvasc 5 mg tablet Oral  Protonix 40 mg granules delayed release for susp packet Oral  Senna 8.6 mg capsule Oral  Synthroid 112 mcg tablet Oral  Toviaz 8 mg tablet, extended release 24 hr 0 Oral  Tylenol 325 mg tablet Oral  Vesicare 10 mg tablet 0 Oral     GU PSH: Prostate Needle Biopsy - 01/07/2019, 2017       PSH Notes: Needle Biopsy Of Prostate, Colonoscopy (Fiberoptic), No Surgical Problems   NON-GU PSH: Diagnostic Colonoscopy - 2016 Surgical Pathology, Gross And Microscopic Examination For Prostate Needle - 01/07/2019     GU PMH: Prostate Cancer (Stable), I was able to identify the PI-RADS 3 lesion seen on MRI and targeted that with my biopsy. His prostate was very small and therefore I reached all the way into the central zone without difficulty. - 01/07/2019, (Worsening), Prostate is noted to be unchanged with a rubbery nodule at the base just to the right of midline however his PSA has continued to rise and currently is 16.44 when corrected for the effects of finasteride. His PSA doubling time based on his past for PSA results would be 11.6 months. We therefore have discussed proceeding with a repeat prostate biopsy. He is in agreement with this., - 12/05/2018 (Worsening), His prostate exam is unchanged. He  has a PI-RADS 2 and a PI-RADS 3 lesion. We discussed the fact that PI-RADS 3 is equivocal but this information will allow me to target the area if we decide to repeat his biopsy. He wanted to proceed with close surveillance so I am going to recheck his PSA again in 3 months and then a DRE and PSA again in 6 months but if his PSA continues to rise we would then proceed with a fusion biopsy., - 05/30/2018 (Worsening), His prostate is unchanged from previous exams. His PSA is currently 4.58. There appears to be a slight trend upward so I have discussed further evaluation of his prostate with an MRI scan. I am  going to do that and repeat his PSA in 3 months and then see him back at that time., - 02/21/2018 (Stable), His PSA has decreased from where was 6 months ago. It is currently 3.79 which is in line with his previous results and I will continue to monitor his DRE and PSA every 6 months., - 08/14/2017, Adenocarcinoma of prostate, - 2017 Urinary Urgency (Worsening), He continues to have urgency with some mild urge incontinence despite taking VESIcare so I am going to try adding samples of Myrbetriq 50 mg to see if this helps. - 08/14/2017 Urge incontinence, Urge incontinence of urine - 2017 Prostate nodule w/o LUTS, Prostate nodule - 2017 BPH w/LUTS, Benign prostatic hyperplasia with urinary obstruction - 2017 Other microscopic hematuria, Microscopic hematuria - 2014      PMH Notes:   BPH with bladder outlet obstruction: The patient does have moderate obstructive voiding symptoms with some irritative symptoms as well. He does get up twice at night, has some frequency and urgency and on rare occasion has leaked some urine with associated urgency. He also has some hesitancy as well as intermittency. He was placed on an alpha-blocker with resolution of his sx but was switched to finasteride and the alpha blocker stopped due to to a drop in his blood pressure on the tamsulosin. He tried Rapaflo but found this ineffective.  Current treatment: Finasteride (started 1/13)   Urgency: He developed urgency that occurred with key in the door and running water. This resulted in mild urge incontinence and he began wearing a protective undergarment. He has had a previous CVA.  Although I had prescribed Lisbeth Ply and he said it was effective his insurance would not cover it. They indicated that they would cover oxybutynin extended release and Vesicare so 10 mg Vesicare was prescribed.     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  Has never drank.  Drinks 2 caffeinated drinks per day.     Notes: Never A Smoker, Marital History - Divorced   REVIEW OF SYSTEMS:    GU Review Male:   Patient denies frequent urination, hard to postpone urination, burning/ pain with urination, get up at night to urinate, leakage  of urine, stream starts and stops, trouble starting your stream, have to strain to urinate , erection problems, and penile pain.  Gastrointestinal (Upper):   Patient denies nausea, vomiting, and indigestion/ heartburn.  Gastrointestinal (Lower):   Patient denies diarrhea and constipation.  Constitutional:   Patient denies fever, night sweats, weight loss, and fatigue.  Skin:   Patient denies skin rash/ lesion and itching.  Eyes:   Patient denies blurred vision and double vision.  Ears/ Nose/ Throat:   Patient denies sore throat and sinus problems.  Hematologic/Lymphatic:   Patient denies easy bruising and swollen glands.  Cardiovascular:   Patient denies leg swelling and chest pains.  Respiratory:   Patient denies cough and shortness of breath.  Endocrine:   Patient denies excessive thirst.  Musculoskeletal:   Patient denies back pain and joint pain.  Neurological:   Patient denies headaches and dizziness.  Psychologic:   Patient denies  depression and anxiety.   Weight 165 lb / 74.84 kg  Height 72 in / 182.88 cm  BP 105/61 mmHg  Pulse 74 /min  Temperature 97.0 F / 36.1 C  BMI 22.4 kg/m    Physical Exam  Constitutional: Well nourished and well developed. No acute distress.   ENT:. The ears and nose are normal in appearance.   Neck: The appearance of the neck is normal and no neck mass is present.   Pulmonary: No respiratory distress and normal respiratory rhythm and effort.   Cardiovascular: Heart rate and rhythm are normal. No peripheral edema.   Abdomen: The abdomen is soft and nontender. No masses are palpated. No CVA tenderness. No hernias are palpable. No hepatosplenomegaly noted.   Rectal: Rectal exam demonstrates normal sphincter tone, no tenderness and no masses. Estimated prostate size is 1+. The prostate has no nodularity and is not tender. The left seminal vesicle is nonpalpable. The right seminal vesicle is nonpalpable. The perineum is normal on inspection.   Genitourinary: Examination of the penis demonstrates no discharge, no masses, no lesions and a normal meatus. The penis is uncircumcised. The scrotum is without lesions. The right epididymis is palpably normal and non-tender. The left epididymis is palpably normal and non-tender. The right testis is non-tender and without masses. The left testis is non-tender and without masses.   Lymphatics: The femoral and inguinal nodes are not enlarged or tender.   Skin: Normal skin turgor, no visible rash and no visible skin lesions.   Neuro/Psych:. Mood and affect are appropriate.    PAST DATA REVIEWED:  Source Of History:  Patient   12/02/18 11/29/18 08/29/18 05/24/18 02/14/18 08/07/17 03/02/17 08/24/16  PSA  Total PSA 8.22 ng/dl 8.08 ng/mL 6.54 ng/mL 5.47 ng/mL 4.58 ng/mL 3.79 ng/mL 4.38 ng/mL 3.72 ng/dl    PROCEDURES: None   ASSESSMENT/PLAN:      ICD-10 Details  1 GU:   Prostate Cancer - C61 Worsening - He received a Lupron injection, will  begin vitamin-D and calcium and be scheduled for an appointment with the radiation oncologist.              Notes:   I went over his pathology report with him today. We discussed the fact that he has shown some degree of grade progression a more aggressive form of prostate cancer now present. I therefore did discuss with him the fact that he needs to consider possibly undergoing treatment which would be with either IMRT or radioactive seed implant. He does have voiding symptoms that are controlled with VESIcare and Myrbetriq  so I think he might be a better candidate for IMRT. Because he has unfavorable intermediate disease but his PSA is below 10 we discussed the fact that I do not feel imaging is necessary but did discuss the use of neoadjuvant ADT. I went over Lupron and its mechanism of action as well as potential side effects. I have recommended he begin vitamin-D and calcium.   I scheduled him for consultation with a radiation oncologist and he has elected to proceed with IMRT.  He presents today for fiducial marker placement and SpaceOAR injection.

## 2019-04-18 ENCOUNTER — Ambulatory Visit (HOSPITAL_COMMUNITY): Payer: Medicare Other | Admitting: Physician Assistant

## 2019-04-18 ENCOUNTER — Encounter (HOSPITAL_COMMUNITY)
Admission: RE | Disposition: A | Discharge status: Home / Self Care | Payer: Self-pay | Source: Home / Self Care | Attending: Urology

## 2019-04-18 ENCOUNTER — Other Ambulatory Visit (HOSPITAL_COMMUNITY): Payer: Medicare Other

## 2019-04-18 ENCOUNTER — Ambulatory Visit (HOSPITAL_COMMUNITY)
Admission: RE | Admit: 2019-04-18 | Discharge: 2019-04-18 | Disposition: A | Discharge status: Home / Self Care | Payer: Medicare Other | Attending: Urology | Admitting: Urology

## 2019-04-18 ENCOUNTER — Ambulatory Visit (HOSPITAL_COMMUNITY): Payer: Medicare Other

## 2019-04-18 ENCOUNTER — Encounter (HOSPITAL_COMMUNITY): Payer: Self-pay

## 2019-04-18 DIAGNOSIS — Z8042 Family history of malignant neoplasm of prostate: Secondary | ICD-10-CM | POA: Insufficient documentation

## 2019-04-18 DIAGNOSIS — F0390 Unspecified dementia without behavioral disturbance: Secondary | ICD-10-CM | POA: Diagnosis not present

## 2019-04-18 DIAGNOSIS — E118 Type 2 diabetes mellitus with unspecified complications: Secondary | ICD-10-CM | POA: Diagnosis not present

## 2019-04-18 DIAGNOSIS — I10 Essential (primary) hypertension: Secondary | ICD-10-CM | POA: Insufficient documentation

## 2019-04-18 DIAGNOSIS — Z20828 Contact with and (suspected) exposure to other viral communicable diseases: Secondary | ICD-10-CM | POA: Diagnosis not present

## 2019-04-18 DIAGNOSIS — Z79899 Other long term (current) drug therapy: Secondary | ICD-10-CM | POA: Diagnosis not present

## 2019-04-18 DIAGNOSIS — E7800 Pure hypercholesterolemia, unspecified: Secondary | ICD-10-CM | POA: Diagnosis not present

## 2019-04-18 DIAGNOSIS — Z8673 Personal history of transient ischemic attack (TIA), and cerebral infarction without residual deficits: Secondary | ICD-10-CM | POA: Diagnosis not present

## 2019-04-18 DIAGNOSIS — Z7984 Long term (current) use of oral hypoglycemic drugs: Secondary | ICD-10-CM | POA: Diagnosis not present

## 2019-04-18 DIAGNOSIS — K219 Gastro-esophageal reflux disease without esophagitis: Secondary | ICD-10-CM | POA: Insufficient documentation

## 2019-04-18 DIAGNOSIS — C61 Malignant neoplasm of prostate: Secondary | ICD-10-CM | POA: Diagnosis not present

## 2019-04-18 HISTORY — PX: SPACE OAR INSTILLATION: SHX6769

## 2019-04-18 HISTORY — PX: GOLD SEED IMPLANT: SHX6343

## 2019-04-18 LAB — CBC WITH DIFFERENTIAL/PLATELET
Abs Immature Granulocytes: 0.01 10*3/uL (ref 0.00–0.07)
Basophils Absolute: 0 10*3/uL (ref 0.0–0.1)
Basophils Relative: 0 %
Eosinophils Absolute: 0.2 10*3/uL (ref 0.0–0.5)
Eosinophils Relative: 2 %
HCT: 36.3 % — ABNORMAL LOW (ref 39.0–52.0)
Hemoglobin: 11.9 g/dL — ABNORMAL LOW (ref 13.0–17.0)
Immature Granulocytes: 0 %
Lymphocytes Relative: 47 %
Lymphs Abs: 3.3 10*3/uL (ref 0.7–4.0)
MCH: 31.8 pg (ref 26.0–34.0)
MCHC: 32.8 g/dL (ref 30.0–36.0)
MCV: 97.1 fL (ref 80.0–100.0)
Monocytes Absolute: 0.5 10*3/uL (ref 0.1–1.0)
Monocytes Relative: 7 %
Neutro Abs: 3.1 10*3/uL (ref 1.7–7.7)
Neutrophils Relative %: 44 %
Platelets: 267 10*3/uL (ref 150–400)
RBC: 3.74 MIL/uL — ABNORMAL LOW (ref 4.22–5.81)
RDW: 11.3 % — ABNORMAL LOW (ref 11.5–15.5)
WBC: 7.1 10*3/uL (ref 4.0–10.5)
nRBC: 0 % (ref 0.0–0.2)

## 2019-04-18 LAB — BASIC METABOLIC PANEL
Anion gap: 7 (ref 5–15)
BUN: 24 mg/dL — ABNORMAL HIGH (ref 8–23)
CO2: 27 mmol/L (ref 22–32)
Calcium: 9.3 mg/dL (ref 8.9–10.3)
Chloride: 101 mmol/L (ref 98–111)
Creatinine, Ser: 1.08 mg/dL (ref 0.61–1.24)
GFR calc Af Amer: >60 mL/min (ref >60)
GFR calc non Af Amer: >60 mL/min (ref >60)
Glucose, Bld: 95 mg/dL (ref 70–99)
Potassium: 4.4 mmol/L (ref 3.5–5.1)
Sodium: 135 mmol/L (ref 135–145)

## 2019-04-18 LAB — SARS CORONAVIRUS 2 BY RT PCR (HOSPITAL ORDER, PERFORMED IN ~~LOC~~ HOSPITAL LAB): SARS Coronavirus 2: NEGATIVE

## 2019-04-18 LAB — HEMOGLOBIN A1C
Hgb A1c MFr Bld: 6.3 % — ABNORMAL HIGH (ref 4.8–5.6)
Mean Plasma Glucose: 134.11 mg/dL

## 2019-04-18 LAB — GLUCOSE, CAPILLARY: Glucose-Capillary: 79 mg/dL (ref 70–99)

## 2019-04-18 SURGERY — INSERTION, GOLD SEEDS
Anesthesia: Monitor Anesthesia Care

## 2019-04-18 MED ORDER — SODIUM CHLORIDE 0.9 % IV SOLN
1.0000 g | Freq: Once | INTRAVENOUS | Status: AC
  Administered 2019-04-18: 1 g via INTRAVENOUS
  Filled 2019-04-18: qty 1

## 2019-04-18 MED ORDER — FENTANYL CITRATE (PF) 100 MCG/2ML IJ SOLN
INTRAMUSCULAR | Status: AC
  Filled 2019-04-18: qty 2

## 2019-04-18 MED ORDER — FENTANYL CITRATE (PF) 100 MCG/2ML IJ SOLN: 25.0000 ug | INTRAMUSCULAR | Status: DC | PRN

## 2019-04-18 MED ORDER — PROPOFOL 500 MG/50ML IV EMUL
INTRAVENOUS | Status: AC
  Filled 2019-04-18: qty 50

## 2019-04-18 MED ORDER — PROPOFOL 10 MG/ML IV BOLUS
INTRAVENOUS | Status: AC
  Filled 2019-04-18: qty 20

## 2019-04-18 MED ORDER — SODIUM CHLORIDE (PF) 0.9 % IJ SOLN
INTRAMUSCULAR | Status: DC | PRN
  Administered 2019-04-18: 50 mL

## 2019-04-18 MED ORDER — ONDANSETRON HCL 4 MG/2ML IJ SOLN
INTRAMUSCULAR | Status: DC | PRN
  Administered 2019-04-18: 4 mg via INTRAVENOUS

## 2019-04-18 MED ORDER — LACTATED RINGERS IV SOLN
INTRAVENOUS | Status: DC
  Administered 2019-04-18: 11:00:00 via INTRAVENOUS

## 2019-04-18 MED ORDER — EPHEDRINE SULFATE-NACL 50-0.9 MG/10ML-% IV SOSY
PREFILLED_SYRINGE | INTRAVENOUS | Status: DC | PRN
  Administered 2019-04-18: 5 mg via INTRAVENOUS

## 2019-04-18 MED ORDER — FENTANYL CITRATE (PF) 100 MCG/2ML IJ SOLN
INTRAMUSCULAR | Status: DC | PRN
  Administered 2019-04-18: 25 ug via INTRAVENOUS

## 2019-04-18 MED ORDER — PROPOFOL 500 MG/50ML IV EMUL
INTRAVENOUS | Status: DC | PRN
  Administered 2019-04-18: 60 ug/kg/min via INTRAVENOUS

## 2019-04-18 MED ORDER — 0.9 % SODIUM CHLORIDE (POUR BTL) OPTIME
TOPICAL | Status: DC | PRN
  Administered 2019-04-18: 13:00:00 1000 mL

## 2019-04-18 SURGICAL SUPPLY — 15 items
DRSG TEGADERM 4X4.75 (GAUZE/BANDAGES/DRESSINGS) ×4 IMPLANT
DRSG TEGADERM 8X12 (GAUZE/BANDAGES/DRESSINGS) ×4 IMPLANT
GLOVE BIO SURGEON STRL SZ7.5 (GLOVE) ×2 IMPLANT
GLOVE BIO SURGEON STRL SZ8 (GLOVE) ×2 IMPLANT
GLOVE ECLIPSE 8.0 STRL XLNG CF (GLOVE) IMPLANT
GOWN STRL REUS W/TWL LRG LVL3 (GOWN DISPOSABLE) ×2 IMPLANT
GOWN STRL REUS W/TWL XL LVL3 (GOWN DISPOSABLE) ×2 IMPLANT
IMPL SPACEOAR SYSTEM 10ML (Spacer) ×1 IMPLANT
IMPLANT SPACEOAR SYSTEM 10ML (Spacer) ×2 IMPLANT
MARKER GOLD PRELOAD 1.2X3 (Urological Implant) ×1 IMPLANT
PACK CYSTO (CUSTOM PROCEDURE TRAY) ×2 IMPLANT
PAD PREP 24X48 CUFFED NSTRL (MISCELLANEOUS) ×2 IMPLANT
SEED GOLD PRELOAD 1.2X3 (Urological Implant) ×2 IMPLANT
SURGILUBE 2OZ TUBE FLIPTOP (MISCELLANEOUS) ×2 IMPLANT
UNDERPAD 30X36 HEAVY ABSORB (UNDERPADS AND DIAPERS) ×4 IMPLANT

## 2019-04-18 NOTE — Anesthesia Preprocedure Evaluation (Addendum)
Anesthesia Evaluation  Patient identified by MRN, date of birth, ID band Patient awake    Reviewed: Allergy & Precautions, NPO status , Patient's Chart, lab work & pertinent test results  Airway Mallampati: I  TM Distance: >3 FB Neck ROM: Full    Dental  (+) Upper Dentures, Lower Dentures   Pulmonary neg pulmonary ROS,    breath sounds clear to auscultation       Cardiovascular hypertension, Pt. on medications and Pt. on home beta blockers  Rhythm:Regular Rate:Normal     Neuro/Psych Dementia CVA    GI/Hepatic Neg liver ROS, GERD  ,  Endo/Other  diabetes, Type 2, Oral Hypoglycemic AgentsHypothyroidism   Renal/GU negative Renal ROS     Musculoskeletal negative musculoskeletal ROS (+)   Abdominal Normal abdominal exam  (+)   Peds  Hematology negative hematology ROS (+)   Anesthesia Other Findings   Reproductive/Obstetrics                            Lab Results  Component Value Date   WBC 6.9 03/24/2019   HGB 11.7 (L) 03/24/2019   HCT 37.3 (L) 03/24/2019   MCV 100.5 (H) 03/24/2019   PLT 267 03/24/2019   EKG: normal sinus rhythm, RBBB.  Anesthesia Physical Anesthesia Plan  ASA: III  Anesthesia Plan: MAC   Post-op Pain Management:    Induction: Intravenous  PONV Risk Score and Plan: Propofol infusion and Ondansetron  Airway Management Planned: Simple Face Mask and Natural Airway  Additional Equipment: None  Intra-op Plan:   Post-operative Plan:   Informed Consent: I have reviewed the patients History and Physical, chart, labs and discussed the procedure including the risks, benefits and alternatives for the proposed anesthesia with the patient or authorized representative who has indicated his/her understanding and acceptance.       Plan Discussed with: CRNA  Anesthesia Plan Comments:        Anesthesia Quick Evaluation

## 2019-04-18 NOTE — Discharge Instructions (Signed)
Hydrogel Spacer Implantation for Prostate Cancer, Care After This sheet gives you information about how to care for yourself after your procedure. Your health care provider may also give you more specific instructions. If you have problems or questions, contact your health care provider. What can I expect after the procedure? After the procedure, it is common to have:  A feeling of fullness in your rectum for a few days.  Pink-colored urine for a short time. This should clear as you increase your fluid intake. Follow these instructions at home: Medicines  Take over-the-counter and prescription medicines only as told by your health care provider.  If you were prescribed an antibiotic medicine, take it as told by your health care provider. Do not stop taking the antibiotic even if you start to feel better. Activity   Do not drive for 24 hours if you were given a sedative during your procedure.  Return to your normal activities as told by your health care provider. Ask your health care provider what activities are safe for you.  Do not lift anything that is heavier than 10 lb (4.5 kg), or the limit that you are told, until your health care provider says that it is safe. Eating and drinking   Drink enough fluid to keep your urine pale yellow.  Eat foods that are high in fiber, such as beans, whole grains, and fresh fruits and vegetables.  Limit foods that are high in fat and processed sugars, such as fried or sweet foods. General instructions  Do not place anything into your rectum for 3 months.  Keep all follow-up visits as told by your health care provider. This is important. ? You will need to return for an MRI test before starting your radiation treatment. Contact a health care provider if you have:  A fever.  Chills.  Rectal pain.  Pain when you urinate.  Trouble passing urine.  Blood in your urine or stool. Get help right away if you have:  Shortness of breath or  problems breathing. Summary  After the procedure, it is normal to have a feeling of fullness in your rectum for a few days.  Take over-the-counter and prescription medicines only as told by your health care provider.  Return to your normal activities as told by your health care provider.  Contact your health care provider if you have chills, a fever, pain, or blood in your urine or stool.

## 2019-04-18 NOTE — Op Note (Addendum)
PATIENT:  Willie [name]  PRE-OPERATIVE DIAGNOSIS: Adenocarcinoma the prostate  POST-OPERATIVE DIAGNOSIS: Same  PROCEDURE: 1.  Transrectal ultrasound 2.  Gold seed fiducial marker placement. 3.  SpaceOAR injection  SURGEON:  Claybon Jabs  Radiation Oncologist: Tammi Klippel  INDICATION: Willie [name] is a 78 year old male who was undergoing active surveillance for low risk prostate cancer.  He was first diagnosed with prostate cancer in 3/17.  At the time of a repeat biopsy in 7/20 he was found to have grade progression with Gleason 4+3 and 3+4 present.  He therefore has elected to undergo IMRT and presents today for fiducial marker placement and SpaceOAR injection.  ANESTHESIA:  MAC  EBL:  Minimal  DRAINS: None  LOCAL MEDICATIONS USED:  None  SPECIMEN: None  Description of procedure: After informed consent the patient was taken to the operating room and placed on the table in a supine position. General anesthesia was then administered. Once fully anesthetized the patient was moved to the dorsal lithotomy position and the genitalia were sterilely prepped and draped in standard fashion. An official timeout was then performed.  The transrectal ultrasound probe was placed in the rectum after having been affixed to the stabilizing stand.  Scanning through the prostate the fiducial markers were then placed transperitoneally.  They were placed in the right apex, right base and left lateral aspect of the prostate.  I then proceeded with placement of SpaceOAR by introducing a needle with the bevel angled inferiorly approximately 2 cm superior to the anus. This was angled downward and under direct ultrasound was placed within the space between the prostatic capsule and rectum. This was confirmed with a small amount of sterile saline injected and this was performed under direct ultrasound. I then attached the SpaceOAR to the needle and injected this in the space between the prostate and rectum with  good placement noted.  PLAN OF CARE: Discharge to home after PACU  PATIENT DISPOSITION:  PACU - hemodynamically stable.

## 2019-04-18 NOTE — Progress Notes (Signed)
Spoke with med tech at [facility] and confirmed that patient received his 0800 doses of daily meds.  His vitamins were on hold since 04/10/19.

## 2019-04-18 NOTE — Transfer of Care (Signed)
Immediate Anesthesia Transfer of Care Note  Patient: Willie [name]  Procedure(s) Performed: Procedure(s): GOLD SEED IMPLANT (N/A) SPACE OAR INSTILLATION (N/A)  Patient Location: PACU  Anesthesia Type:MAC  Level of Consciousness:  sedated, patient cooperative and responds to stimulation  Airway & Oxygen Therapy:Patient Spontanous Breathing and Patient connected to face mask oxgen  Post-op Assessment:  Report given to PACU RN and Post -op Vital signs reviewed and stable  Post vital signs:  Reviewed and stable  Last Vitals:  Vitals:   04/18/19 0853  BP: 114/69  Pulse: (!) 56  Resp: 18  Temp: 36.7 C  SpO2: 12%    Complications: No apparent anesthesia complications

## 2019-04-18 NOTE — Anesthesia Postprocedure Evaluation (Signed)
Anesthesia Post Note  Patient: DEMONTREZ [name]  Procedure([name]) Performed: GOLD SEED IMPLANT (N/A ) SPACE OAR INSTILLATION (N/A )     Patient location during evaluation: PACU Anesthesia Type: MAC Level of consciousness: awake and alert Pain management: pain level controlled Vital Signs Assessment: post-procedure vital signs reviewed and stable Respiratory status: spontaneous breathing, nonlabored ventilation, respiratory function stable and patient connected to nasal cannula oxygen Cardiovascular status: stable and blood pressure returned to baseline Postop Assessment: no apparent nausea or vomiting Anesthetic complications: no    Last Vitals:  Vitals:   04/18/19 0853 04/18/19 1337  BP: 114/69 127/65  Pulse: (!) 56 (!) 57  Resp: 18 12  Temp: 36.7 C (!) 36.1 C  SpO2: 99% 100%    Last Pain:  Vitals:   04/18/19 1337  TempSrc:   PainSc: 0-No pain                 Willie [name]

## 2019-04-21 ENCOUNTER — Telehealth: Payer: Self-pay | Admitting: *Deleted

## 2019-04-21 NOTE — Telephone Encounter (Signed)
CALLED PATIENT TO INFORM OF SIM APPT. FOR 04/22/19 - ARRIVAL TIME- 1:15 PM @ [facility] AND HIS MRI FOR 04-22-19 - ARRIVAL TIME- 4:30 PM @ [facility], NO RESTRICTIONS TO TEST, SPOKE WITH PATIENT'S DAUGHTER IN-LAW (JULIE Mase) AND SHE IS AWARE OF THESE APPTS.

## 2019-04-22 ENCOUNTER — Ambulatory Visit: Payer: Medicare Other | Admitting: Radiation Oncology

## 2019-04-22 ENCOUNTER — Encounter (HOSPITAL_COMMUNITY): Payer: Self-pay | Admitting: Urology

## 2019-04-22 ENCOUNTER — Ambulatory Visit
Admission: RE | Admit: 2019-04-22 | Discharge: 2019-04-22 | Disposition: A | Discharge status: Home / Self Care | Payer: Medicare Other | Source: Ambulatory Visit | Attending: Radiation Oncology | Admitting: Radiation Oncology

## 2019-04-22 ENCOUNTER — Other Ambulatory Visit: Payer: Self-pay

## 2019-04-22 ENCOUNTER — Telehealth: Payer: Self-pay | Admitting: *Deleted

## 2019-04-22 ENCOUNTER — Encounter: Payer: Self-pay | Admitting: Medical Oncology

## 2019-04-22 ENCOUNTER — Ambulatory Visit (HOSPITAL_COMMUNITY)
Admission: RE | Admit: 2019-04-22 | Discharge: 2019-04-22 | Disposition: A | Discharge status: Home / Self Care | Payer: Medicare Other | Source: Ambulatory Visit | Attending: Urology | Admitting: Urology

## 2019-04-22 DIAGNOSIS — Z510 Encounter for antineoplastic radiation therapy: Secondary | ICD-10-CM | POA: Diagnosis present

## 2019-04-22 DIAGNOSIS — C61 Malignant neoplasm of prostate: Secondary | ICD-10-CM | POA: Diagnosis present

## 2019-04-22 NOTE — Telephone Encounter (Signed)
CALLED PATIENT'S DAUGHTER [place]-LA[place] TO LET HER KNOW THAT SHE CAN COME WITH HER FATHER IN[place] TODAY FOR APPTS. DUE TO HIS MEMORY ISSUES, SPOKE WITH PATIENT'S DAUGHTER IN-LAW, JULIE AND SHE IS AWARE OF THIS

## 2019-04-22 NOTE — Progress Notes (Signed)
  Radiation Oncology         [phone]) [phone] ________________________________  Name: Willie [name] MRN: [id number]  Date: 04/22/2019  DOB: 14-Oct-1940  SIMULATION AND TREATMENT PLANNING NOTE    ICD-10-CM   1. Malignant neoplasm of prostate ([facility])  C61     DIAGNOSIS:  78 y.o. gentleman with Stage T1c adenocarcinoma of the prostate with Gleason score of 4+3, and PSA of 8.22 (corrected to 16.44 on finasteride).  NARRATIVE:  The patient was brought to the [facility].  Identity was confirmed.  All relevant records and images related to the planned course of therapy were reviewed.  The patient freely provided informed written consent to proceed with treatment after reviewing the details related to the planned course of therapy. The consent form was witnessed and verified by the simulation staff.  Then, the patient was set-up in a stable reproducible supine position for radiation therapy.  A vacuum lock pillow device was custom fabricated to position his legs in a reproducible immobilized position.  Then, I performed a urethrogram under sterile conditions to identify the prostatic apex.  CT images were obtained.  Surface markings were placed.  The CT images were loaded into the planning software.  Then the prostate target and avoidance structures including the rectum, bladder, bowel and hips were contoured.  Treatment planning then occurred.  The radiation prescription was entered and confirmed.  A total of one complex treatment devices was fabricated. I have requested : Intensity Modulated Radiotherapy (IMRT) is medically necessary for this case for the following reason:  Rectal sparing.Marland Kitchen  PLAN:  The patient will receive 70 Gy in 28 fractions.  ________________________________  Sheral Apley Tammi Klippel, M.D.

## 2019-04-23 ENCOUNTER — Encounter: Payer: Self-pay | Admitting: Urology

## 2019-04-23 ENCOUNTER — Telehealth: Payer: Self-pay | Admitting: Radiation Oncology

## 2019-04-23 NOTE — Telephone Encounter (Signed)
As per my note in patient chart, this has been approved by Dr. Lisbeth Renshaw and Romie Jumper was planning to call her to let her know. -Danelle Curiale

## 2019-04-23 NOTE — Progress Notes (Signed)
Approval received from Dr. Lisbeth Renshaw and in-basket request sent to Romie Jumper to please call Willie [name] daughter in-law, Willie [name], at [phone] to let her know that she has been approved to accompany Willie [name] for his daily radiation treatments which are scheduled to begin on 05/01/19.  Nicholos Johns, MMS, PA-C [facility]: [phone]  Fax: [phone]

## 2019-04-23 NOTE — Telephone Encounter (Signed)
Received voicemail requesting return call. Phoned Isidoro Donning, daughter in law, back. She request to accompany patient to each treatment and Friday PUT visits because the patient has dementia. She understands her request will be forwarded onto the providers and once a decision is reached this RN will phone her back. Patient scheduled to start radiation therapy on 05/01/2019.

## 2019-04-24 ENCOUNTER — Telehealth: Payer: Self-pay | Admitting: *Deleted

## 2019-04-24 ENCOUNTER — Telehealth: Payer: Self-pay | Admitting: Radiation Oncology

## 2019-04-24 NOTE — Telephone Encounter (Signed)
Phoned [name], patient's daughter in law. Explained approval was received from Dr. Lisbeth Renshaw for her to accompany her father in law to and from his radiation treatments and during his PUT encounters on Friday with Dr. Tammi Klippel. This decision was made because the patient has dementia. [name] verbalized understanding of all reviewed and appreciation for the return call.

## 2019-04-24 NOTE — Telephone Encounter (Signed)
CALLED PATIENT'S RELATIVE - [place] HER THAT DR. MOODY AGREED FOR HER TO COME FOR PATIENT'S DAILY RADIATION TREATMENTS AND PUT VISIT, PATIENT'S RELATIVE - [place] UNDERSTANDING THIS

## 2019-04-28 DIAGNOSIS — Z510 Encounter for antineoplastic radiation therapy: Secondary | ICD-10-CM | POA: Diagnosis not present

## 2019-05-01 ENCOUNTER — Ambulatory Visit
Admission: RE | Admit: 2019-05-01 | Discharge: 2019-05-01 | Disposition: A | Discharge status: Home / Self Care | Payer: Medicare Other | Source: Ambulatory Visit | Attending: Radiation Oncology | Admitting: Radiation Oncology

## 2019-05-01 ENCOUNTER — Other Ambulatory Visit: Payer: Self-pay

## 2019-05-01 DIAGNOSIS — Z510 Encounter for antineoplastic radiation therapy: Secondary | ICD-10-CM | POA: Diagnosis not present

## 2019-05-02 ENCOUNTER — Ambulatory Visit
Admission: RE | Admit: 2019-05-02 | Discharge: 2019-05-02 | Disposition: A | Discharge status: Home / Self Care | Payer: Medicare Other | Source: Ambulatory Visit | Attending: Radiation Oncology | Admitting: Radiation Oncology

## 2019-05-02 ENCOUNTER — Other Ambulatory Visit: Payer: Self-pay

## 2019-05-02 ENCOUNTER — Telehealth: Payer: Self-pay | Admitting: Radiation Oncology

## 2019-05-02 DIAGNOSIS — Z510 Encounter for antineoplastic radiation therapy: Secondary | ICD-10-CM | POA: Diagnosis not present

## 2019-05-02 NOTE — Telephone Encounter (Signed)
Received message from secretary that this patient's daughter, Willie [name], is requesting a return call. Phoned Willie [name] back. She expresses her displeasure that she wasn't called back to be with her father during his PUT encounter today. Expressed my sincerest apology and intent to do better. Willie [name] verbalized, "I won't be as nice next time." Notes made in Epic and staff informed.

## 2019-05-04 ENCOUNTER — Other Ambulatory Visit: Payer: Self-pay

## 2019-05-04 ENCOUNTER — Ambulatory Visit
Admission: RE | Admit: 2019-05-04 | Discharge: 2019-05-04 | Disposition: A | Discharge status: Home / Self Care | Payer: Medicare Other | Source: Ambulatory Visit | Attending: Radiation Oncology | Admitting: Radiation Oncology

## 2019-05-04 DIAGNOSIS — Z510 Encounter for antineoplastic radiation therapy: Secondary | ICD-10-CM | POA: Diagnosis not present

## 2019-05-05 ENCOUNTER — Ambulatory Visit
Admission: RE | Admit: 2019-05-05 | Discharge: 2019-05-05 | Disposition: A | Discharge status: Home / Self Care | Payer: Medicare Other | Source: Ambulatory Visit | Attending: Radiation Oncology | Admitting: Radiation Oncology

## 2019-05-05 ENCOUNTER — Other Ambulatory Visit: Payer: Self-pay

## 2019-05-05 DIAGNOSIS — Z510 Encounter for antineoplastic radiation therapy: Secondary | ICD-10-CM | POA: Diagnosis not present

## 2019-05-06 ENCOUNTER — Ambulatory Visit
Admission: RE | Admit: 2019-05-06 | Discharge: 2019-05-06 | Disposition: A | Discharge status: Home / Self Care | Payer: Medicare Other | Source: Ambulatory Visit | Attending: Radiation Oncology | Admitting: Radiation Oncology

## 2019-05-06 ENCOUNTER — Other Ambulatory Visit: Payer: Self-pay

## 2019-05-06 DIAGNOSIS — Z510 Encounter for antineoplastic radiation therapy: Secondary | ICD-10-CM | POA: Diagnosis not present

## 2019-05-07 ENCOUNTER — Other Ambulatory Visit: Payer: Self-pay

## 2019-05-07 ENCOUNTER — Ambulatory Visit
Admission: RE | Admit: 2019-05-07 | Discharge: 2019-05-07 | Disposition: A | Discharge status: Home / Self Care | Payer: Medicare Other | Source: Ambulatory Visit | Attending: Radiation Oncology | Admitting: Radiation Oncology

## 2019-05-07 DIAGNOSIS — Z510 Encounter for antineoplastic radiation therapy: Secondary | ICD-10-CM | POA: Diagnosis not present

## 2019-05-12 ENCOUNTER — Ambulatory Visit
Admission: RE | Admit: 2019-05-12 | Discharge: 2019-05-12 | Disposition: A | Discharge status: Home / Self Care | Payer: Medicare Other | Source: Ambulatory Visit | Attending: Radiation Oncology | Admitting: Radiation Oncology

## 2019-05-12 ENCOUNTER — Other Ambulatory Visit: Payer: Self-pay

## 2019-05-12 DIAGNOSIS — Z510 Encounter for antineoplastic radiation therapy: Secondary | ICD-10-CM | POA: Diagnosis not present

## 2019-05-13 ENCOUNTER — Ambulatory Visit
Admission: RE | Admit: 2019-05-13 | Discharge: 2019-05-13 | Disposition: A | Discharge status: Home / Self Care | Payer: Medicare Other | Source: Ambulatory Visit | Attending: Radiation Oncology | Admitting: Radiation Oncology

## 2019-05-13 ENCOUNTER — Other Ambulatory Visit: Payer: Self-pay

## 2019-05-13 DIAGNOSIS — Z510 Encounter for antineoplastic radiation therapy: Secondary | ICD-10-CM | POA: Insufficient documentation

## 2019-05-13 DIAGNOSIS — C61 Malignant neoplasm of prostate: Secondary | ICD-10-CM | POA: Insufficient documentation

## 2019-05-14 ENCOUNTER — Ambulatory Visit
Admission: RE | Admit: 2019-05-14 | Discharge: 2019-05-14 | Disposition: A | Discharge status: Home / Self Care | Payer: Medicare Other | Source: Ambulatory Visit | Attending: Radiation Oncology | Admitting: Radiation Oncology

## 2019-05-14 ENCOUNTER — Other Ambulatory Visit: Payer: Self-pay

## 2019-05-14 DIAGNOSIS — Z510 Encounter for antineoplastic radiation therapy: Secondary | ICD-10-CM | POA: Diagnosis not present

## 2019-05-15 ENCOUNTER — Ambulatory Visit
Admission: RE | Admit: 2019-05-15 | Discharge: 2019-05-15 | Disposition: A | Discharge status: Home / Self Care | Payer: Medicare Other | Source: Ambulatory Visit | Attending: Radiation Oncology | Admitting: Radiation Oncology

## 2019-05-15 ENCOUNTER — Other Ambulatory Visit: Payer: Self-pay

## 2019-05-15 DIAGNOSIS — Z510 Encounter for antineoplastic radiation therapy: Secondary | ICD-10-CM | POA: Diagnosis not present

## 2019-05-16 ENCOUNTER — Ambulatory Visit
Admission: RE | Admit: 2019-05-16 | Discharge: 2019-05-16 | Disposition: A | Discharge status: Home / Self Care | Payer: Medicare Other | Source: Ambulatory Visit | Attending: Radiation Oncology | Admitting: Radiation Oncology

## 2019-05-16 ENCOUNTER — Other Ambulatory Visit: Payer: Self-pay

## 2019-05-16 DIAGNOSIS — Z510 Encounter for antineoplastic radiation therapy: Secondary | ICD-10-CM | POA: Diagnosis not present

## 2019-05-19 ENCOUNTER — Ambulatory Visit
Admission: RE | Admit: 2019-05-19 | Discharge: 2019-05-19 | Disposition: A | Discharge status: Home / Self Care | Payer: Medicare Other | Source: Ambulatory Visit | Attending: Radiation Oncology | Admitting: Radiation Oncology

## 2019-05-19 ENCOUNTER — Other Ambulatory Visit: Payer: Self-pay

## 2019-05-19 DIAGNOSIS — Z510 Encounter for antineoplastic radiation therapy: Secondary | ICD-10-CM | POA: Diagnosis not present

## 2019-05-20 ENCOUNTER — Ambulatory Visit
Admission: RE | Admit: 2019-05-20 | Discharge: 2019-05-20 | Disposition: A | Discharge status: Home / Self Care | Payer: Medicare Other | Source: Ambulatory Visit | Attending: Radiation Oncology | Admitting: Radiation Oncology

## 2019-05-20 ENCOUNTER — Other Ambulatory Visit: Payer: Self-pay

## 2019-05-20 DIAGNOSIS — Z510 Encounter for antineoplastic radiation therapy: Secondary | ICD-10-CM | POA: Diagnosis not present

## 2019-05-21 ENCOUNTER — Other Ambulatory Visit: Payer: Self-pay

## 2019-05-21 ENCOUNTER — Telehealth: Payer: Self-pay | Admitting: Radiation Oncology

## 2019-05-21 ENCOUNTER — Ambulatory Visit
Admission: RE | Admit: 2019-05-21 | Discharge: 2019-05-21 | Disposition: A | Discharge status: Home / Self Care | Payer: Medicare Other | Source: Ambulatory Visit | Attending: Radiation Oncology | Admitting: Radiation Oncology

## 2019-05-21 DIAGNOSIS — Z510 Encounter for antineoplastic radiation therapy: Secondary | ICD-10-CM | POA: Diagnosis not present

## 2019-05-21 NOTE — Telephone Encounter (Signed)
Received voicemail message from [place], patient's daughter in law, requesting a return call. Phoned [name] back. [name] reports the patient had a stroke 5 years ago and has been off balance since. She explains when she picked him up this morning to bring him for radiation treatment he requested she make him an appointment with his pcp. She adds that he complained of his legs feeling like rubber bands or like he was always jumping on a trampoline. She reports him telling her this sensation was present before the start of radiation but has gradually gotten worse. She questions if radiation therapy to his prostate could cause this. I explained this is not a side effect of radiation and strongly encouraged her to make him an appointment for evaluation with PCP. Explained the fatigue caused by radiation could be magnifying something neurologic. She agreed to contact his PCP and obtain an appointment.

## 2019-05-22 ENCOUNTER — Other Ambulatory Visit: Payer: Self-pay

## 2019-05-22 ENCOUNTER — Ambulatory Visit
Admission: RE | Admit: 2019-05-22 | Discharge: 2019-05-22 | Disposition: A | Discharge status: Home / Self Care | Payer: Medicare Other | Source: Ambulatory Visit | Attending: Radiation Oncology | Admitting: Radiation Oncology

## 2019-05-22 DIAGNOSIS — Z510 Encounter for antineoplastic radiation therapy: Secondary | ICD-10-CM | POA: Diagnosis not present

## 2019-05-23 ENCOUNTER — Other Ambulatory Visit: Payer: Self-pay

## 2019-05-23 ENCOUNTER — Ambulatory Visit
Admission: RE | Admit: 2019-05-23 | Discharge: 2019-05-23 | Disposition: A | Discharge status: Home / Self Care | Payer: Medicare Other | Source: Ambulatory Visit | Attending: Radiation Oncology | Admitting: Radiation Oncology

## 2019-05-23 DIAGNOSIS — Z510 Encounter for antineoplastic radiation therapy: Secondary | ICD-10-CM | POA: Diagnosis not present

## 2019-05-26 ENCOUNTER — Ambulatory Visit
Admission: RE | Admit: 2019-05-26 | Discharge: 2019-05-26 | Disposition: A | Discharge status: Home / Self Care | Payer: Medicare Other | Source: Ambulatory Visit | Attending: Radiation Oncology | Admitting: Radiation Oncology

## 2019-05-26 ENCOUNTER — Other Ambulatory Visit: Payer: Self-pay

## 2019-05-26 DIAGNOSIS — Z510 Encounter for antineoplastic radiation therapy: Secondary | ICD-10-CM | POA: Diagnosis not present

## 2019-05-27 ENCOUNTER — Ambulatory Visit
Admission: RE | Admit: 2019-05-27 | Discharge: 2019-05-27 | Disposition: A | Discharge status: Home / Self Care | Payer: Medicare Other | Source: Ambulatory Visit | Attending: Radiation Oncology | Admitting: Radiation Oncology

## 2019-05-27 ENCOUNTER — Other Ambulatory Visit: Payer: Self-pay

## 2019-05-27 DIAGNOSIS — Z510 Encounter for antineoplastic radiation therapy: Secondary | ICD-10-CM | POA: Diagnosis not present

## 2019-05-28 ENCOUNTER — Other Ambulatory Visit: Payer: Self-pay

## 2019-05-28 ENCOUNTER — Ambulatory Visit
Admission: RE | Admit: 2019-05-28 | Discharge: 2019-05-28 | Disposition: A | Discharge status: Home / Self Care | Payer: Medicare Other | Source: Ambulatory Visit | Attending: Radiation Oncology | Admitting: Radiation Oncology

## 2019-05-28 DIAGNOSIS — Z510 Encounter for antineoplastic radiation therapy: Secondary | ICD-10-CM | POA: Diagnosis not present

## 2019-05-29 ENCOUNTER — Ambulatory Visit
Admission: RE | Admit: 2019-05-29 | Discharge: 2019-05-29 | Disposition: A | Discharge status: Home / Self Care | Payer: Medicare Other | Source: Ambulatory Visit | Attending: Radiation Oncology | Admitting: Radiation Oncology

## 2019-05-29 ENCOUNTER — Other Ambulatory Visit: Payer: Self-pay

## 2019-05-29 DIAGNOSIS — Z510 Encounter for antineoplastic radiation therapy: Secondary | ICD-10-CM | POA: Diagnosis not present

## 2019-05-30 ENCOUNTER — Ambulatory Visit
Admission: RE | Admit: 2019-05-30 | Discharge: 2019-05-30 | Disposition: A | Discharge status: Home / Self Care | Payer: Medicare Other | Source: Ambulatory Visit | Attending: Radiation Oncology | Admitting: Radiation Oncology

## 2019-05-30 ENCOUNTER — Other Ambulatory Visit: Payer: Self-pay

## 2019-05-30 DIAGNOSIS — Z510 Encounter for antineoplastic radiation therapy: Secondary | ICD-10-CM | POA: Diagnosis not present

## 2019-06-02 ENCOUNTER — Other Ambulatory Visit: Payer: Self-pay

## 2019-06-02 ENCOUNTER — Ambulatory Visit
Admission: RE | Admit: 2019-06-02 | Discharge: 2019-06-02 | Disposition: A | Discharge status: Home / Self Care | Payer: Medicare Other | Source: Ambulatory Visit | Attending: Radiation Oncology | Admitting: Radiation Oncology

## 2019-06-02 DIAGNOSIS — Z510 Encounter for antineoplastic radiation therapy: Secondary | ICD-10-CM | POA: Diagnosis not present

## 2019-06-03 ENCOUNTER — Other Ambulatory Visit: Payer: Self-pay

## 2019-06-03 ENCOUNTER — Ambulatory Visit
Admission: RE | Admit: 2019-06-03 | Discharge: 2019-06-03 | Disposition: A | Discharge status: Home / Self Care | Payer: Medicare Other | Source: Ambulatory Visit | Attending: Radiation Oncology | Admitting: Radiation Oncology

## 2019-06-03 DIAGNOSIS — Z510 Encounter for antineoplastic radiation therapy: Secondary | ICD-10-CM | POA: Diagnosis not present

## 2019-06-04 ENCOUNTER — Ambulatory Visit
Admission: RE | Admit: 2019-06-04 | Discharge: 2019-06-04 | Disposition: A | Discharge status: Home / Self Care | Payer: Medicare Other | Source: Ambulatory Visit | Attending: Radiation Oncology | Admitting: Radiation Oncology

## 2019-06-04 ENCOUNTER — Other Ambulatory Visit: Payer: Self-pay

## 2019-06-04 DIAGNOSIS — Z510 Encounter for antineoplastic radiation therapy: Secondary | ICD-10-CM | POA: Diagnosis not present

## 2019-06-05 ENCOUNTER — Ambulatory Visit
Admission: RE | Admit: 2019-06-05 | Discharge: 2019-06-05 | Disposition: A | Discharge status: Home / Self Care | Payer: Medicare Other | Source: Ambulatory Visit | Attending: Radiation Oncology | Admitting: Radiation Oncology

## 2019-06-05 ENCOUNTER — Other Ambulatory Visit: Payer: Self-pay

## 2019-06-05 DIAGNOSIS — Z510 Encounter for antineoplastic radiation therapy: Secondary | ICD-10-CM | POA: Diagnosis not present

## 2019-06-09 ENCOUNTER — Ambulatory Visit
Admission: RE | Admit: 2019-06-09 | Discharge: 2019-06-09 | Disposition: A | Discharge status: Home / Self Care | Payer: Medicare Other | Source: Ambulatory Visit | Attending: Radiation Oncology | Admitting: Radiation Oncology

## 2019-06-09 ENCOUNTER — Other Ambulatory Visit: Payer: Self-pay

## 2019-06-09 DIAGNOSIS — Z510 Encounter for antineoplastic radiation therapy: Secondary | ICD-10-CM | POA: Diagnosis not present

## 2019-06-10 ENCOUNTER — Other Ambulatory Visit: Payer: Self-pay

## 2019-06-10 ENCOUNTER — Ambulatory Visit
Admission: RE | Admit: 2019-06-10 | Discharge: 2019-06-10 | Disposition: A | Discharge status: Home / Self Care | Payer: Medicare Other | Source: Ambulatory Visit | Attending: Radiation Oncology | Admitting: Radiation Oncology

## 2019-06-10 ENCOUNTER — Telehealth: Payer: Self-pay | Admitting: Radiation Oncology

## 2019-06-10 DIAGNOSIS — Z510 Encounter for antineoplastic radiation therapy: Secondary | ICD-10-CM | POA: Diagnosis not present

## 2019-06-10 NOTE — Telephone Encounter (Signed)
Received an email from treatment therapist that the patient is suffering from diarrhea. Phoned [name], daughter in law, to inquire further. She reports the patient has had diarrhea x 1 week unknown to her until today. Faxed order for Imodium to [facility] at [facility] [phone] with the following instructions: take 4 mg after initial loose BM then 2 mg each loose BM thereafter but don't exceed 8 mg in 24 hours. Fax confirmation of delivery obtained. Phoned [name] back to let her know order was sent so she can follow up with [facility]. Will check patient status tomorrow following final radiation treatment.

## 2019-06-11 ENCOUNTER — Encounter: Payer: Self-pay | Admitting: Urology

## 2019-06-11 ENCOUNTER — Other Ambulatory Visit: Payer: Self-pay

## 2019-06-11 ENCOUNTER — Ambulatory Visit
Admission: RE | Admit: 2019-06-11 | Discharge: 2019-06-11 | Disposition: A | Discharge status: Home / Self Care | Payer: Medicare Other | Source: Ambulatory Visit | Attending: Urology | Admitting: Urology

## 2019-06-11 ENCOUNTER — Encounter: Payer: Self-pay | Admitting: Radiation Oncology

## 2019-06-11 DIAGNOSIS — Z510 Encounter for antineoplastic radiation therapy: Secondary | ICD-10-CM | POA: Diagnosis not present

## 2019-06-12 ENCOUNTER — Ambulatory Visit: Payer: Medicare Other

## 2019-06-15 NOTE — Progress Notes (Signed)
  Radiation Oncology         [phone] ________________________________  Name: Willie [name] MRN: [id number]  Date: 06/11/2019  DOB: February 01, 1941  End of Treatment Note  Diagnosis:   79 y.o. gentleman with Stage T1c adenocarcinoma of the prostate with Gleason score of 4+3, and PSA of 8.22 (corrected to 16.44 on finasteride).     Indication for treatment:  Curative, Definitive Radiotherapy       Radiation treatment dates:   05/01/19-06/11/19  Site/dose:   The prostate was treated to 70 Gy in 28 fractions of 2.5 Gy  Beams/energy:   The patient was treated with IMRT using volumetric arc therapy delivering 6 MV X-rays to clockwise and counterclockwise circumferential arcs with a 90 degree collimator offset to avoid dose scalloping.  Image guidance was performed with daily cone beam CT prior to each fraction to align to gold markers in the prostate and assure proper bladder and rectal fill volumes.  Immobilization was achieved with BodyFix custom mold.  Narrative: The patient tolerated radiation treatment relatively well.   The patient experienced some minor urinary irritation and modest fatigue.    Plan: The patient has completed radiation treatment. He will return to radiation oncology clinic for routine followup in one month. I advised him to call or return sooner if he has any questions or concerns related to his recovery or treatment. ________________________________  Sheral Apley. Tammi Klippel, M.D.

## 2019-06-19 ENCOUNTER — Encounter (INDEPENDENT_AMBULATORY_CARE_PROVIDER_SITE_OTHER): Payer: Medicare Other | Admitting: Ophthalmology

## 2019-06-19 ENCOUNTER — Other Ambulatory Visit: Payer: Self-pay

## 2019-06-19 DIAGNOSIS — E113392 Type 2 diabetes mellitus with moderate nonproliferative diabetic retinopathy without macular edema, left eye: Secondary | ICD-10-CM | POA: Diagnosis not present

## 2019-06-19 DIAGNOSIS — H35033 Hypertensive retinopathy, bilateral: Secondary | ICD-10-CM

## 2019-06-19 DIAGNOSIS — E113591 Type 2 diabetes mellitus with proliferative diabetic retinopathy without macular edema, right eye: Secondary | ICD-10-CM

## 2019-06-19 DIAGNOSIS — E11319 Type 2 diabetes mellitus with unspecified diabetic retinopathy without macular edema: Secondary | ICD-10-CM | POA: Diagnosis not present

## 2019-06-19 DIAGNOSIS — H43813 Vitreous degeneration, bilateral: Secondary | ICD-10-CM

## 2019-06-19 DIAGNOSIS — I10 Essential (primary) hypertension: Secondary | ICD-10-CM | POA: Diagnosis not present

## 2019-06-27 ENCOUNTER — Telehealth: Payer: Self-pay | Admitting: Radiation Oncology

## 2019-06-27 NOTE — Telephone Encounter (Signed)
Received voicemail message from patient's daughter in law, Jarette Pullum, requesting to cancel and reschedule his follow up appointment for a later date and time. [name] reports the patient has been diagnosed with COVID 19. Will pass message along to Romie Jumper to reschedule and contact [name].

## 2019-07-10 ENCOUNTER — Other Ambulatory Visit: Payer: Self-pay

## 2019-07-10 ENCOUNTER — Encounter: Payer: Self-pay | Admitting: Urology

## 2019-07-10 ENCOUNTER — Ambulatory Visit
Admission: RE | Admit: 2019-07-10 | Discharge: 2019-07-10 | Disposition: A | Discharge status: Home / Self Care | Payer: Medicare Other | Source: Ambulatory Visit | Attending: Urology | Admitting: Urology

## 2019-07-10 DIAGNOSIS — C61 Malignant neoplasm of prostate: Secondary | ICD-10-CM

## 2019-07-10 NOTE — Progress Notes (Signed)
Radiation Oncology         (336) [phone] ________________________________  Name: LINSEY HIROTA MRN: [id number]  Date: 07/10/2019  DOB: 1941-03-01  Post Treatment Note  CC: Josetta Huddle, MD  Josetta Huddle, MD  Diagnosis:   79 y.o. gentleman with Stage T1c adenocarcinoma of the prostate with Gleason score of 4+3, and PSA of 8.22 (corrected to 16.44 on finasteride).     Interval Since Last Radiation:  4 weeks  05/01/19-06/11/19:  The prostate was treated to 70 Gy in 28 fractions of 2.5 Gy; concurrent with ADT.  Narrative:  I spoke with the patient to conduct his routine scheduled 1 month follow up visit via telephone to spare the patient unnecessary potential exposure in the healthcare setting during the current COVID-19 pandemic.  The patient was notified in advance and gave permission to proceed with this visit format.  He tolerated radiation treatment relatively well.   The patient experienced some minor urinary irritation and modest fatigue.                                On review of systems, the patient states that he is doing well overall.  He has noticed gradual improvement in the LUTS but continues with occasional dysuria at the start of his stream, a weaker flow of stream and some increased frequency/urgency.  He specifically denies gross hematuria, fever, chills, nausea, vomiting, diarrhea or constipation.  He reports a healthy appetite and is maintaining his weight.  He has had some cold chills recently but has his temperature checked regularly at the assisted living facility where he resides and he has not had any fever documented.  He denies any significant fatigue or hot flashes associated with the ADT.  Overall, he is quite pleased with his progress to date.  ALLERGIES:  is allergic to aspirin.  Meds: Current Outpatient Medications  Medication Sig Dispense Refill  . acetaminophen (TYLENOL) 325 MG tablet Take 650 mg by mouth every 6 (six) hours as needed for moderate pain.     Marland Kitchen amLODipine (NORVASC) 2.5 MG tablet Take 2.5 mg by mouth every morning.     Marland Kitchen atorvastatin (LIPITOR) 10 MG tablet Take 10 mg by mouth every morning.     . baclofen (LIORESAL) 10 MG tablet Take 10 mg by mouth 3 (three) times daily.    . bisacodyl (DULCOLAX) 5 MG EC tablet Take 5 mg by mouth daily. May take additional dose as needed    . Blood Glucose Monitoring Suppl (ONETOUCH VERIO) w/Device KIT     . Calcium Carbonate-Vitamin D3 (CALCIUM 600/VITAMIN D) 600-400 MG-UNIT TABS Take 1 tablet by mouth 2 (two) times daily.    . Carboxymethylcell-Hypromellose (GENTEAL) 0.25-0.3 % GEL Place 1 application into the right eye 2 (two) times daily.    . Carboxymethylcellul-Glycerin (REFRESH OPTIVE) 1-0.9 % GEL Place 1 drop into the right eye every 2 (two) hours as needed (dry eye).    . finasteride (PROSCAR) 5 MG tablet Take 5 mg by mouth every morning.    . irbesartan (AVAPRO) 75 MG tablet Take 75 mg by mouth daily.     . Lancets (ONETOUCH DELICA PLUS [id number]) MISC     . levothyroxine (SYNTHROID) 137 MCG tablet Take 137 mcg by mouth daily before breakfast.    . Melatonin 1 MG TABS Take 1 mg by mouth at bedtime.    . Memantine HCl-Donepezil HCl (NAMZARIC) 28-10 MG CP24 Take 1  capsule by mouth daily.    . metFORMIN (GLUCOPHAGE) 500 MG tablet Take 500 mg by mouth daily with breakfast.     . metoprolol succinate (TOPROL-XL) 50 MG 24 hr tablet Take 50 mg by mouth daily. Take with or immediately following a meal.    . mirabegron ER (MYRBETRIQ) 50 MG TB24 tablet Take 50 mg by mouth daily.     . Multiple Vitamins-Minerals (MULTIVITAMIN ADULTS) TABS Take 1 tablet by mouth daily.    Glory Rosebush VERIO test strip     . Polyethyl Glyc-Propyl Glyc PF (SYSTANE ULTRA PF) 0.4-0.3 % SOLN Place 1 drop into the right eye every 4 (four) hours.    . Sennosides (EX-LAX) 15 MG CHEW Chew 1 tablet by mouth 2 (two) times daily as needed (constipation).    . sennosides-docusate sodium (SENOKOT-S) 8.6-50 MG tablet Take 1 tablet by  mouth every morning.    . sodium chloride 1 g tablet Take 1 g by mouth daily.    . solifenacin (VESICARE) 10 MG tablet Take 10 mg by mouth every evening.     . traZODone (DESYREL) 100 MG tablet Take 100 mg by mouth at bedtime.    . traZODone (DESYREL) 50 MG tablet Take 50 mg by mouth at bedtime as needed for sleep.    . Vitamin E 400 units TABS Take 1 tablet by mouth daily.     No current facility-administered medications for this encounter.    Physical Findings:  vitals were not taken for this visit.   /Unable to assess due to telephone follow up visit format.  Lab Findings: Lab Results  Component Value Date   WBC 7.1 04/18/2019   HGB 11.9 (L) 04/18/2019   HCT 36.3 (L) 04/18/2019   MCV 97.1 04/18/2019   PLT 267 04/18/2019     Radiographic Findings: No results found.  Impression/Plan: 1. 79 y.o. gentleman with Stage T1c adenocarcinoma of the prostate with Gleason score of 4+3, and PSA of 8.22 (corrected to 16.44 on finasteride).    He will continue to follow up with urology for ongoing PSA determinations and has an appointment scheduled with Dr. Karsten Ro in February 2021. He understands what to expect with regards to PSA monitoring going forward. I will look forward to following his response to treatment via correspondence with urology, and would be happy to continue to participate in his care if clinically indicated. I talked to the patient about what to expect in the future, including his risk for erectile dysfunction and rectal bleeding. I encouraged him to call or return to the office if he has any questions regarding his previous radiation or possible radiation side effects. He was comfortable with this plan and will follow up as needed.    Nicholos Johns, PA-C

## 2019-07-20 ENCOUNTER — Other Ambulatory Visit: Payer: Self-pay

## 2019-07-20 ENCOUNTER — Encounter (HOSPITAL_COMMUNITY): Payer: Self-pay

## 2019-07-20 ENCOUNTER — Emergency Department (HOSPITAL_COMMUNITY)
Admission: EM | Admit: 2019-07-20 | Discharge: 2019-07-20 | Disposition: A | Discharge status: Home / Self Care | Payer: Medicare Other | Attending: Emergency Medicine | Admitting: Emergency Medicine

## 2019-07-20 DIAGNOSIS — E039 Hypothyroidism, unspecified: Secondary | ICD-10-CM | POA: Diagnosis not present

## 2019-07-20 DIAGNOSIS — Z7984 Long term (current) use of oral hypoglycemic drugs: Secondary | ICD-10-CM | POA: Diagnosis not present

## 2019-07-20 DIAGNOSIS — I10 Essential (primary) hypertension: Secondary | ICD-10-CM | POA: Insufficient documentation

## 2019-07-20 DIAGNOSIS — K5900 Constipation, unspecified: Secondary | ICD-10-CM | POA: Diagnosis present

## 2019-07-20 DIAGNOSIS — F0390 Unspecified dementia without behavioral disturbance: Secondary | ICD-10-CM | POA: Diagnosis not present

## 2019-07-20 DIAGNOSIS — Z79899 Other long term (current) drug therapy: Secondary | ICD-10-CM | POA: Insufficient documentation

## 2019-07-20 DIAGNOSIS — E119 Type 2 diabetes mellitus without complications: Secondary | ICD-10-CM | POA: Diagnosis not present

## 2019-07-20 MED ORDER — FLEET ENEMA 7-19 GM/118ML RE ENEM
1.0000 | ENEMA | Freq: Once | RECTAL | Status: AC
  Administered 2019-07-20: 1 via RECTAL
  Filled 2019-07-20: qty 1

## 2019-07-20 NOTE — ED Notes (Addendum)
Daughter in law [name] provided number for assisted living med [facility] [phone]. [name] confirmed she will come pick up pt and transport home. Cassandra from assisted living confined pt will be returning to homw

## 2019-07-20 NOTE — ED Notes (Signed)
Pt assisted onto bedpan.

## 2019-07-20 NOTE — ED Notes (Signed)
No fleet enema in pyxis waiting pharmacy to tube down

## 2019-07-20 NOTE — Discharge Instructions (Addendum)
Please return for any problem.  Follow-up with your regular care providers as instructed.  You may continue to use MiraLAX as needed until your constipation resolves.

## 2019-07-20 NOTE — ED Provider Notes (Signed)
[facility] DEPT Provider Note   CSN: [id number] Arrival date & time: 07/20/19  1135     History Chief Complaint  Patient presents with  . Constipation    ABDON PETROSKY is a 79 y.o. male.  79 year old male with prior medical history as detailed below presents for evaluation of constipation.  Patient with longstanding history of constipation.  He reports that his last significant bowel movement was about 5 days prior.  He reports use of MiraLAX at his facility without significant improvement in his symptoms.  He denies abdominal pain or fever.  He denies nausea or vomiting.  He reports that in the past a fleets enema has been successful in alleviating his constipation.  He request a fleets enema now.  He asked the staff at his facility for a fleets enema and they sent him to the ED.  The history is provided by the patient and medical records.  Constipation Severity:  Moderate Time since last bowel movement:  5 days Timing:  Constant Progression:  Waxing and waning Chronicity:  New Stool description:  None produced Relieved by:  Nothing Worsened by:  Nothing      Past Medical History:  Diagnosis Date  . Alcohol abuse   . Benign prostatic hypertrophy   . Dementia [facility])    daughter-in-law does not know of this   . Diabetes mellitus   . Encephalopathy   . GERD (gastroesophageal reflux disease)   . History of tarsorrhaphy    right eye  . Hypertension   . Hypothyroidism   . Pneumonia   . Prostate cancer ([place])   . Stroke [facility])    right eye does not shut all the way-tarsorrhaphy    Patient Active Problem List   Diagnosis Date Noted  . Malignant neoplasm of prostate ([place]) 01/21/2019  . Essential hypertension 01/01/2019  . Generalized weakness 01/01/2019  . Acute metabolic encephalopathy 38/46/6599  . Controlled type 2 diabetes mellitus with hypoglycemia ([place]) 12/31/2018  . Hypothyroidism 12/31/2018  . Normocytic normochromic anemia  12/31/2018  . Hyponatremia 12/31/2018    Past Surgical History:  Procedure Laterality Date  . COLONOSCOPY WITH PROPOFOL N/A 08/24/2014   Procedure: COLONOSCOPY WITH PROPOFOL;  Surgeon: Garlan Fair, MD;  Location: [facility] ENDOSCOPY;  Service: Endoscopy;  Laterality: N/A;  . EYE SURGERY     Laser eye surgery  . GOLD SEED IMPLANT N/A 04/18/2019   Procedure: GOLD SEED IMPLANT;  Surgeon: Kathie Rhodes, MD;  Location: [facility] ORS;  Service: Urology;  Laterality: N/A;  . PROSTATE BIOPSY    . SPACE OAR INSTILLATION N/A 04/18/2019   Procedure: SPACE OAR INSTILLATION;  Surgeon: Kathie Rhodes, MD;  Location: [facility] ORS;  Service: Urology;  Laterality: N/A;       Family History  Problem Relation Age of Onset  . Prostate cancer Brother   . Colon cancer Brother   . Breast cancer Neg Hx   . Pancreatic cancer Neg Hx     Social History   Tobacco Use  . Smoking status: Never Smoker  . Smokeless tobacco: Never Used  Substance Use Topics  . Alcohol use: Not Currently  . Drug use: No    Home Medications Prior to Admission medications   Medication Sig Start Date End Date Taking? Authorizing Provider  acetaminophen (TYLENOL) 325 MG tablet Take 650 mg by mouth every 6 (six) hours as needed for moderate pain.    [provider]  amLODipine (NORVASC) 2.5 MG tablet Take 2.5 mg by mouth  every morning.     [provider]  atorvastatin (LIPITOR) 10 MG tablet Take 10 mg by mouth every morning.     [provider]  baclofen (LIORESAL) 10 MG tablet Take 10 mg by mouth 3 (three) times daily. 06/14/19   [provider]  bisacodyl (DULCOLAX) 5 MG EC tablet Take 5 mg by mouth daily. May take additional dose as needed    [provider]  Blood Glucose Monitoring Suppl (ONETOUCH VERIO) w/Device KIT  12/31/18   [provider]  Calcium Carbonate-Vitamin D3 (CALCIUM 600/VITAMIN D) 600-400 MG-UNIT TABS Take 1 tablet by mouth 2 (two) times daily.    [provider]   Carboxymethylcell-Hypromellose (GENTEAL) 0.25-0.3 % GEL Place 1 application into the right eye 2 (two) times daily.    [provider]  Carboxymethylcellul-Glycerin (REFRESH OPTIVE) 1-0.9 % GEL Place 1 drop into the right eye every 2 (two) hours as needed (dry eye).    [provider]  finasteride (PROSCAR) 5 MG tablet Take 5 mg by mouth every morning.    [provider]  irbesartan (AVAPRO) 75 MG tablet Take 75 mg by mouth daily.     [provider]  Lancets (ONETOUCH DELICA PLUS [id number]) [place]  12/31/18   [provider]  levothyroxine (SYNTHROID) 137 MCG tablet Take 137 mcg by mouth daily before breakfast.    [provider]  Melatonin 1 MG TABS Take 1 mg by mouth at bedtime.    [provider]  Memantine HCl-Donepezil HCl (NAMZARIC) 28-10 MG CP24 Take 1 capsule by mouth daily.    [provider]  metFORMIN (GLUCOPHAGE) 500 MG tablet Take 500 mg by mouth daily with breakfast.     [provider]  metoprolol succinate (TOPROL-XL) 50 MG 24 hr tablet Take 50 mg by mouth daily. Take with or immediately following a meal.    [provider]  mirabegron ER (MYRBETRIQ) 50 MG TB24 tablet Take 50 mg by mouth daily.     [provider]  Multiple Vitamins-Minerals (MULTIVITAMIN ADULTS) TABS Take 1 tablet by mouth daily.    [provider]  [facility] VERIO test strip  12/31/18   [provider]  Polyethyl Glyc-Propyl Glyc PF (SYSTANE ULTRA PF) 0.4-0.3 % SOLN Place 1 drop into the right eye every 4 (four) hours.    [provider]  Sennosides (EX-LAX) 15 MG CHEW Chew 1 tablet by mouth 2 (two) times daily as needed (constipation).    [provider]  sennosides-docusate sodium (SENOKOT-S) 8.6-50 MG tablet Take 1 tablet by mouth every morning.    [provider]  sodium chloride 1 g tablet Take 1 g by mouth daily.    [provider]  solifenacin (VESICARE) 10 MG  tablet Take 10 mg by mouth every evening.     [provider]  traZODone (DESYREL) 100 MG tablet Take 100 mg by mouth at bedtime. 06/14/19   [provider]  traZODone (DESYREL) 50 MG tablet Take 50 mg by mouth at bedtime as needed for sleep.    [provider]  Vitamin E 400 units TABS Take 1 tablet by mouth daily.    [provider]    Allergies    Aspirin  Review of Systems   Review of Systems  Gastrointestinal: Positive for constipation.  All other systems reviewed and are negative.   Physical Exam Updated Vital Signs BP 120/66 (BP Location: Left Arm)   Pulse 94   Temp 98.7  F (37.1 C) (Oral)   Resp 18   SpO2 96%   Physical Exam Vitals and nursing note reviewed.  Constitutional:      General: He is not in acute distress.    Appearance: Normal appearance. He is well-developed.  HENT:     Head: Normocephalic and atraumatic.  Eyes:     Conjunctiva/sclera: Conjunctivae normal.     Pupils: Pupils are equal, round, and reactive to light.  Cardiovascular:     Rate and Rhythm: Normal rate and regular rhythm.     Heart sounds: Normal heart sounds.  Pulmonary:     Effort: Pulmonary effort is normal. No respiratory distress.     Breath sounds: Normal breath sounds.  Abdominal:     General: There is no distension.     Palpations: Abdomen is soft.     Tenderness: There is no abdominal tenderness.  Genitourinary:    Comments: Hard stool present in rectal vault  Musculoskeletal:        General: No deformity. Normal range of motion.     Cervical back: Normal range of motion and neck supple.  Skin:    General: Skin is warm and dry.  Neurological:     Mental Status: He is alert and oriented to person, place, and time. Mental status is at baseline.     ED Results / Procedures / Treatments   Labs (all labs ordered are listed, but only abnormal results are displayed) Labs Reviewed - No data to display  EKG None  Radiology No results  found.  Procedures Procedures (including critical care time)  Stool disimpaction - 1330 - moderate amount of stool removed from patient's rectum with digital disimpaction. Patient feels improved following.    Medications Ordered in ED Medications  sodium phosphate (FLEET) 7-19 GM/118ML enema 1 enema (has no administration in time range)    ED Course  I have reviewed the triage vital signs and the nursing notes.  Pertinent labs & imaging results that were available during my care of the patient were reviewed by me and considered in my medical decision making (see chart for details).    MDM Rules/Calculators/A&P                      MDM  Screen complete  ELSTER CORBELLO was evaluated in Emergency Department on 07/20/2019 for the symptoms described in the history of present illness. He was evaluated in the context of the global COVID-19 pandemic, which necessitated consideration that the patient might be at risk for infection with the SARS-CoV-2 virus that causes COVID-19. Institutional protocols and algorithms that pertain to the evaluation of patients at risk for COVID-19 are in a state of rapid change based on information released by regulatory bodies including the CDC and federal and state organizations. These policies and algorithms were followed during the patient's care in the ED.  Patient is presenting for reported constipation.  Exam suggests fecal impaction.  Patient was disimpacted manually.  Enemas x3 were use to loosen his impaction.  Following his ED visit he feels improved.  Patient will be discharged.  Importance of close follow-up is stressed.  Final Clinical Impression(s) / ED Diagnoses Final diagnoses:  Constipation, unspecified constipation type    Rx / DC Orders ED Discharge Orders    None       Valarie Merino, MD 07/20/19 1441

## 2019-07-20 NOTE — ED Notes (Signed)
Pt verbalizes understanding of DC instructions. Pt belongings returned and is assisted in wheelchair out of ED.

## 2019-07-20 NOTE — ED Triage Notes (Signed)
Pt presents with c/o constipation for 6 days. Pt also reports he recently finished taking his medications for a UTI.

## 2019-07-22 ENCOUNTER — Other Ambulatory Visit: Payer: Self-pay

## 2019-07-22 ENCOUNTER — Encounter (HOSPITAL_COMMUNITY): Payer: Self-pay | Admitting: Emergency Medicine

## 2019-07-22 ENCOUNTER — Emergency Department (HOSPITAL_COMMUNITY)
Admission: EM | Admit: 2019-07-22 | Discharge: 2019-07-22 | Disposition: A | Discharge status: Home / Self Care | Payer: Medicare Other | Attending: Emergency Medicine | Admitting: Emergency Medicine

## 2019-07-22 ENCOUNTER — Emergency Department (HOSPITAL_COMMUNITY): Payer: Medicare Other

## 2019-07-22 DIAGNOSIS — Z7984 Long term (current) use of oral hypoglycemic drugs: Secondary | ICD-10-CM | POA: Diagnosis not present

## 2019-07-22 DIAGNOSIS — E860 Dehydration: Secondary | ICD-10-CM | POA: Insufficient documentation

## 2019-07-22 DIAGNOSIS — Z79899 Other long term (current) drug therapy: Secondary | ICD-10-CM | POA: Diagnosis not present

## 2019-07-22 DIAGNOSIS — F0390 Unspecified dementia without behavioral disturbance: Secondary | ICD-10-CM | POA: Insufficient documentation

## 2019-07-22 DIAGNOSIS — I10 Essential (primary) hypertension: Secondary | ICD-10-CM | POA: Diagnosis not present

## 2019-07-22 DIAGNOSIS — E119 Type 2 diabetes mellitus without complications: Secondary | ICD-10-CM | POA: Diagnosis not present

## 2019-07-22 DIAGNOSIS — R4182 Altered mental status, unspecified: Secondary | ICD-10-CM | POA: Diagnosis present

## 2019-07-22 DIAGNOSIS — R251 Tremor, unspecified: Secondary | ICD-10-CM | POA: Diagnosis not present

## 2019-07-22 LAB — CBC
HCT: 35.1 % — ABNORMAL LOW (ref 39.0–52.0)
Hemoglobin: 11.4 g/dL — ABNORMAL LOW (ref 13.0–17.0)
MCH: 31.5 pg (ref 26.0–34.0)
MCHC: 32.5 g/dL (ref 30.0–36.0)
MCV: 97 fL (ref 80.0–100.0)
Platelets: 237 10*3/uL (ref 150–400)
RBC: 3.62 MIL/uL — ABNORMAL LOW (ref 4.22–5.81)
RDW: 11.9 % (ref 11.5–15.5)
WBC: 8.7 10*3/uL (ref 4.0–10.5)
nRBC: 0 % (ref 0.0–0.2)

## 2019-07-22 LAB — BASIC METABOLIC PANEL
Anion gap: 10 (ref 5–15)
BUN: 46 mg/dL — ABNORMAL HIGH (ref 8–23)
CO2: 27 mmol/L (ref 22–32)
Calcium: 9.4 mg/dL (ref 8.9–10.3)
Chloride: 104 mmol/L (ref 98–111)
Creatinine, Ser: 1.42 mg/dL — ABNORMAL HIGH (ref 0.61–1.24)
GFR calc Af Amer: 54 mL/min — ABNORMAL LOW (ref >60)
GFR calc non Af Amer: 46 mL/min — ABNORMAL LOW (ref >60)
Glucose, Bld: 112 mg/dL — ABNORMAL HIGH (ref 70–99)
Potassium: 4.4 mmol/L (ref 3.5–5.1)
Sodium: 141 mmol/L (ref 135–145)

## 2019-07-22 LAB — URINALYSIS, ROUTINE W REFLEX MICROSCOPIC
Bilirubin Urine: NEGATIVE
Glucose, UA: NEGATIVE mg/dL
Hgb urine dipstick: NEGATIVE
Ketones, ur: 5 mg/dL — AB
Leukocytes,Ua: NEGATIVE
Nitrite: NEGATIVE
Protein, ur: NEGATIVE mg/dL
Specific Gravity, Urine: 1.028 (ref 1.005–1.030)
pH: 5 (ref 5.0–8.0)

## 2019-07-22 LAB — CBG MONITORING, ED
Glucose-Capillary: 76 mg/dL (ref 70–99)
Glucose-Capillary: 80 mg/dL (ref 70–99)

## 2019-07-22 MED ORDER — SODIUM CHLORIDE 0.9 % IV BOLUS (SEPSIS)
1000.0000 mL | Freq: Once | INTRAVENOUS | Status: AC
  Administered 2019-07-22: 1000 mL via INTRAVENOUS

## 2019-07-22 NOTE — ED Notes (Signed)
Pt had episode of this "shaking" while in triage. Pt had involuntary movements in shoulders, would lean forward, backwards, shake shoulders. Pt could talk the entire time and was oriented. Lasted approx 1 min.

## 2019-07-22 NOTE — ED Notes (Signed)
Discharge instructions discussed with Pt. Pt verbalized understanding. Pt stable and DC'd via wheelchair.

## 2019-07-22 NOTE — ED Notes (Signed)
Daughter, [name] [phone]) would like an update when possible.

## 2019-07-22 NOTE — Discharge Instructions (Addendum)
Drink plenty of fluids and follow-up with your doctor this week 

## 2019-07-22 NOTE — ED Provider Notes (Signed)
[facility] EMERGENCY DEPARTMENT Provider Note   CSN: [id number] Arrival date & time: 07/22/19  1240     History Chief Complaint  Patient presents with  . Altered Mental Status  . Shaking    KINSLEY NICKLAUS is a 79 y.o. male.  Patient brought to the emergency department for having some shaking and worsening dementia.  The history is provided by the patient and a caregiver. No language interpreter was used.  Altered Mental Status Presenting symptoms: behavior changes   Severity:  Moderate Most recent episode:  More than 2 days ago Episode history:  Continuous Timing:  Constant Progression:  Waxing and waning Chronicity:  Recurrent Context: not alcohol use        Past Medical History:  Diagnosis Date  . Alcohol abuse   . Benign prostatic hypertrophy   . Dementia [facility])    daughter-in-law does not know of this   . Diabetes mellitus   . Encephalopathy   . GERD (gastroesophageal reflux disease)   . History of tarsorrhaphy    right eye  . Hypertension   . Hypothyroidism   . Pneumonia   . Prostate cancer ([place])   . 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 Malignant neoplasm of prostate ([place]) 01/21/2019  . Essential hypertension 01/01/2019  . Generalized weakness 01/01/2019  . Acute metabolic encephalopathy 65/68/1275  . Controlled type 2 diabetes mellitus with hypoglycemia ([place]) 12/31/2018  . Hypothyroidism 12/31/2018  . Normocytic normochromic anemia 12/31/2018  . Hyponatremia 12/31/2018    Past Surgical History:  Procedure Laterality Date  . COLONOSCOPY WITH PROPOFOL N/A 08/24/2014   Procedure: COLONOSCOPY WITH PROPOFOL;  Surgeon: Garlan Fair, MD;  [facility];  Service: Endoscopy;  Laterality: N/A;  . EYE SURGERY     Laser eye surgery  . GOLD SEED IMPLANT N/A 04/18/2019   Procedure: GOLD SEED IMPLANT;  Surgeon: Kathie Rhodes, MD;  [facility]  ORS;  Service: Urology;  Laterality: N/A;  . PROSTATE BIOPSY    . SPACE OAR INSTILLATION N/A 04/18/2019   Procedure: SPACE OAR INSTILLATION;  Surgeon: Kathie Rhodes, MD;  [facility] ORS;  Service: Urology;  Laterality: N/A;       Family History  Problem Relation Age of Onset  . Prostate cancer Brother   . Colon cancer Brother   . Breast cancer Neg Hx   . Pancreatic cancer Neg Hx     Social History   Tobacco Use  . Smoking status: Never Smoker  . Smokeless tobacco: Never Used  Substance Use Topics  . Alcohol use: Not Currently  . Drug use: No    Home Medications Prior to Admission medications   Medication Sig Start Date End Date Taking? Authorizing Provider  acetaminophen (TYLENOL) 325 MG tablet Take 650 mg by mouth every 6 (six) hours as needed for moderate pain.    [provider]  atorvastatin (LIPITOR) 10 MG tablet Take 10 mg by mouth daily.     [provider]  baclofen (LIORESAL) 10 MG tablet Take 10 mg by mouth 3 (three) times daily. 06/14/19   [provider]  bisacodyl (DULCOLAX) 5 MG EC tablet Take 5 mg by mouth daily.     [provider]  Blood Glucose Monitoring Suppl (ONETOUCH VERIO) w/Device KIT  12/31/18   [provider]  Calcium Carbonate-Vitamin D3 (CALCIUM 600/VITAMIN D) 600-400 MG-UNIT TABS Take  1 tablet by mouth 2 (two) times daily.    [provider]  Carboxymethylcell-Hypromellose (GENTEAL) 0.25-0.3 % GEL Place 1 application into the right eye 2 (two) times daily.    [provider]  Carboxymethylcellul-Glycerin (REFRESH OPTIVE) 1-0.9 % GEL Place 1 drop into the right eye every 2 (two) hours as needed (dry eye).    [provider]  finasteride (PROSCAR) 5 MG tablet Take 5 mg by mouth every morning.    [provider]  irbesartan (AVAPRO) 75 MG tablet Take 75 mg by mouth daily.     [provider]  Lancets (ONETOUCH DELICA PLUS [id number]) [place]  12/31/18   [provider]  levothyroxine (SYNTHROID) 137 MCG tablet Take 137 mcg by mouth daily before breakfast.    [provider]  loperamide (IMODIUM A-D) 2 MG tablet Take 4 mg by mouth See admin instructions. Take 2 tablets ('4mg'$ ) by mouth after 1st loose stool, then 1 tablet after each loose stool **do not exceed 8 mg in 24 hours*    [provider]  magnesium citrate SOLN Take 1 Bottle by mouth once as needed for severe constipation.    [provider]  Melatonin 1 MG TABS Take 1 mg by mouth at bedtime as needed (insomnia).     [provider]  Memantine HCl-Donepezil HCl (NAMZARIC) 28-10 MG CP24 Take 1 capsule by mouth daily.    [provider]  metFORMIN (GLUCOPHAGE) 500 MG tablet Take 500 mg by mouth daily with breakfast.     [provider]  metoprolol succinate (TOPROL-XL) 25 MG 24 hr tablet Take 25 mg by mouth daily. Take with or immediately following a meal.     [provider]  mirabegron ER (MYRBETRIQ) 50 MG TB24 tablet Take 50 mg by mouth daily.     [provider]  Multiple Vitamins-Minerals (MULTIVITAMIN ADULTS) TABS Take 1 tablet by mouth daily.    [provider]  [facility] VERIO test strip  12/31/18   [provider]  Polyethyl Glyc-Propyl Glyc PF (SYSTANE ULTRA PF) 0.4-0.3 % SOLN Place 1 drop into the right eye every 4 (four) hours. While the patient is awake    [provider]  polyethylene glycol (MIRALAX / GLYCOLAX) 17 g packet Take 17 g by mouth daily as needed for moderate constipation.    [provider]  Sennosides (EX-LAX) 15 MG CHEW Chew 1 tablet by mouth 2 (two) times daily as needed (constipation).    [provider]  sennosides-docusate sodium (SENOKOT-S) 8.6-50 MG tablet Take 1 tablet by mouth every morning.    [provider]  sodium chloride 1 g tablet Take 1 g by mouth daily.    [provider]  solifenacin (VESICARE) 10 MG tablet Take 10 mg  by mouth daily.     [provider]  traZODone (DESYREL) 100 MG tablet Take 100 mg by mouth at bedtime. 06/14/19   [provider]  Vitamin E 400 units TABS Take 400 Units by mouth daily.     [provider]    Allergies    Aspirin  Review of Systems   Review of Systems  Unable to perform ROS: Dementia    Physical Exam Updated Vital Signs BP (!) 137/101   Pulse 82   Temp (!) 97.5 F (36.4 C) (Oral)   Resp 16   SpO2 97%   Physical Exam Vitals and nursing note reviewed.  Constitutional:      Appearance: He  is well-developed.  HENT:     Head: Normocephalic.     Nose: Nose normal.  Eyes:     General: No scleral icterus.    Conjunctiva/sclera: Conjunctivae normal.  Neck:     Thyroid: No thyromegaly.  Cardiovascular:     Rate and Rhythm: Normal rate and regular rhythm.     Heart sounds: No murmur. No friction rub. No gallop.   Pulmonary:     Breath sounds: No stridor. No wheezing or rales.  Chest:     Chest wall: No tenderness.  Abdominal:     General: There is no distension.     Tenderness: There is no abdominal tenderness. There is no rebound.  Musculoskeletal:        General: Normal range of motion.     Cervical back: Neck supple.  Lymphadenopathy:     Cervical: No cervical adenopathy.  Skin:    Findings: No erythema or rash.  Neurological:     Mental Status: He is alert.     Motor: No abnormal muscle tone.     Coordination: Coordination normal.     Comments: Oriented to person and place but confused on situation  Psychiatric:        Behavior: Behavior normal.     ED Results / Procedures / Treatments   Labs (all labs ordered are listed, but only abnormal results are displayed) Labs Reviewed  BASIC METABOLIC PANEL - Abnormal; Notable for the following components:      Result Value   Glucose, Bld 112 (*)    BUN 46 (*)    Creatinine, Ser 1.42 (*)    GFR calc non Af Amer 46 (*)    GFR calc Af Amer 54 (*)    All other components  within normal limits  CBC - Abnormal; Notable for the following components:   RBC 3.62 (*)    Hemoglobin 11.4 (*)    HCT 35.1 (*)    All other components within normal limits  URINALYSIS, ROUTINE W REFLEX MICROSCOPIC - Abnormal; Notable for the following components:   APPearance HAZY (*)    Ketones, ur 5 (*)    All other components within normal limits  CBG MONITORING, ED  CBG MONITORING, ED    EKG None  Radiology CT Head Wo Contrast  Result Date: 07/22/2019 CLINICAL DATA:  79 year old male with ataxia.  Concern for stroke. EXAM: CT HEAD WITHOUT CONTRAST TECHNIQUE: Contiguous axial images were obtained from the base of the skull through the vertex without intravenous contrast. COMPARISON:  Head CT dated 12/28/2018. FINDINGS: Brain: There is mild age-related atrophy and chronic microvascular ischemic changes. There is no acute intracranial hemorrhage. No mass effect or midline shift. No extra-axial fluid collection. Vascular: No hyperdense vessel or unexpected calcification. Skull: Normal. Negative for fracture or focal lesion. Sinuses/Orbits: No acute finding. Other: Minimal scalp contusion over the vertex. IMPRESSION: 1. No acute intracranial pathology. 2. Mild age-related atrophy and chronic microvascular ischemic changes. Electronically Signed   By: Anner Crete M.D.   On: 07/22/2019 17:48    Procedures Procedures (including critical care time)  Medications Ordered in ED Medications  sodium chloride 0.9 % bolus 1,000 mL (0 mLs Intravenous Stopped 07/22/19 1651)    ED Course  I have reviewed the triage vital signs and the nursing notes.  Pertinent labs & imaging results that were available during my care of the patient were reviewed by me and considered in my medical decision making (see chart for details).  MDM Rules/Calculators/A&P                      Patient with shaking spells and dementia.  He is dehydrated.  Patient was given a liter of fluids and seems to be  improving will be discharged home Final Clinical Impression(s) / ED Diagnoses Final diagnoses:  Dehydration    Rx / DC Orders ED Discharge Orders    None       Milton Ferguson, MD 07/22/19 [phone]

## 2019-07-22 NOTE — ED Triage Notes (Signed)
Pt in with AMS and "seizures" per daughter. Pt lives at [place]. Dtr states he has hx of CVA, never seizures. Today, she has noticed what seems like involuntary shaking multiple times today. Pt was at a PCP doc appt when the episodes happened. Recently trx for UTI, oral temp 97.5, finished course of abx. Pt reports feeling "chills" during these shaking episodes. Daughter [name] [phone])

## 2019-08-22 ENCOUNTER — Inpatient Hospital Stay (HOSPITAL_COMMUNITY)
Admission: EM | Admit: 2019-08-22 | Discharge: 2019-08-27 | DRG: 208 | Disposition: A | Discharge status: Home / Self Care | Payer: Medicare Other | Attending: Student | Admitting: Student

## 2019-08-22 ENCOUNTER — Emergency Department (HOSPITAL_COMMUNITY): Payer: Medicare Other

## 2019-08-22 ENCOUNTER — Inpatient Hospital Stay (HOSPITAL_COMMUNITY): Payer: Medicare Other

## 2019-08-22 ENCOUNTER — Other Ambulatory Visit: Payer: Self-pay

## 2019-08-22 DIAGNOSIS — E039 Hypothyroidism, unspecified: Secondary | ICD-10-CM | POA: Diagnosis present

## 2019-08-22 DIAGNOSIS — G40909 Epilepsy, unspecified, not intractable, without status epilepticus: Secondary | ICD-10-CM | POA: Diagnosis present

## 2019-08-22 DIAGNOSIS — Z7984 Long term (current) use of oral hypoglycemic drugs: Secondary | ICD-10-CM

## 2019-08-22 DIAGNOSIS — R1310 Dysphagia, unspecified: Secondary | ICD-10-CM | POA: Diagnosis present

## 2019-08-22 DIAGNOSIS — Z8042 Family history of malignant neoplasm of prostate: Secondary | ICD-10-CM

## 2019-08-22 DIAGNOSIS — E872 Acidosis: Secondary | ICD-10-CM | POA: Diagnosis present

## 2019-08-22 DIAGNOSIS — I4510 Unspecified right bundle-branch block: Secondary | ICD-10-CM | POA: Diagnosis present

## 2019-08-22 DIAGNOSIS — I10 Essential (primary) hypertension: Secondary | ICD-10-CM | POA: Diagnosis not present

## 2019-08-22 DIAGNOSIS — E11649 Type 2 diabetes mellitus with hypoglycemia without coma: Secondary | ICD-10-CM | POA: Diagnosis present

## 2019-08-22 DIAGNOSIS — E871 Hypo-osmolality and hyponatremia: Secondary | ICD-10-CM | POA: Diagnosis present

## 2019-08-22 DIAGNOSIS — E1159 Type 2 diabetes mellitus with other circulatory complications: Secondary | ICD-10-CM | POA: Diagnosis not present

## 2019-08-22 DIAGNOSIS — R569 Unspecified convulsions: Secondary | ICD-10-CM

## 2019-08-22 DIAGNOSIS — R4182 Altered mental status, unspecified: Secondary | ICD-10-CM

## 2019-08-22 DIAGNOSIS — F0150 Vascular dementia without behavioral disturbance: Secondary | ICD-10-CM | POA: Diagnosis present

## 2019-08-22 DIAGNOSIS — Z79899 Other long term (current) drug therapy: Secondary | ICD-10-CM

## 2019-08-22 DIAGNOSIS — J9601 Acute respiratory failure with hypoxia: Secondary | ICD-10-CM | POA: Diagnosis present

## 2019-08-22 DIAGNOSIS — G9341 Metabolic encephalopathy: Secondary | ICD-10-CM | POA: Diagnosis present

## 2019-08-22 DIAGNOSIS — Z800 Family history of malignant neoplasm of digestive organs: Secondary | ICD-10-CM

## 2019-08-22 DIAGNOSIS — Z923 Personal history of irradiation: Secondary | ICD-10-CM | POA: Diagnosis not present

## 2019-08-22 DIAGNOSIS — Z8546 Personal history of malignant neoplasm of prostate: Secondary | ICD-10-CM

## 2019-08-22 DIAGNOSIS — N170 Acute kidney failure with tubular necrosis: Secondary | ICD-10-CM | POA: Diagnosis present

## 2019-08-22 DIAGNOSIS — E860 Dehydration: Secondary | ICD-10-CM | POA: Diagnosis present

## 2019-08-22 DIAGNOSIS — N400 Enlarged prostate without lower urinary tract symptoms: Secondary | ICD-10-CM | POA: Diagnosis present

## 2019-08-22 DIAGNOSIS — F0391 Unspecified dementia with behavioral disturbance: Secondary | ICD-10-CM | POA: Diagnosis not present

## 2019-08-22 DIAGNOSIS — Z8673 Personal history of transient ischemic attack (TIA), and cerebral infarction without residual deficits: Secondary | ICD-10-CM | POA: Diagnosis not present

## 2019-08-22 DIAGNOSIS — Z79890 Hormone replacement therapy (postmenopausal): Secondary | ICD-10-CM | POA: Diagnosis not present

## 2019-08-22 DIAGNOSIS — Z993 Dependence on wheelchair: Secondary | ICD-10-CM | POA: Diagnosis not present

## 2019-08-22 DIAGNOSIS — Z20822 Contact with and (suspected) exposure to covid-19: Secondary | ICD-10-CM | POA: Diagnosis present

## 2019-08-22 LAB — POCT I-STAT EG7
Acid-Base Excess: 3 mmol/L — ABNORMAL HIGH (ref 0.0–2.0)
Bicarbonate: 27.9 mmol/L (ref 20.0–28.0)
Calcium, Ion: 1.25 mmol/L (ref 1.15–1.40)
HCT: 35 % — ABNORMAL LOW (ref 39.0–52.0)
Hemoglobin: 11.9 g/dL — ABNORMAL LOW (ref 13.0–17.0)
O2 Saturation: 87 %
Potassium: 4 mmol/L (ref 3.5–5.1)
Sodium: 143 mmol/L (ref 135–145)
TCO2: 29 mmol/L (ref 22–32)
pCO2, Ven: 45.3 mmHg (ref 44.0–60.0)
pH, Ven: 7.398 (ref 7.250–7.430)
pO2, Ven: 53 mmHg — ABNORMAL HIGH (ref 32.0–45.0)

## 2019-08-22 LAB — CBC WITH DIFFERENTIAL/PLATELET
Abs Immature Granulocytes: 0.01 10*3/uL (ref 0.00–0.07)
Basophils Absolute: 0 10*3/uL (ref 0.0–0.1)
Basophils Relative: 1 %
Eosinophils Absolute: 0 10*3/uL (ref 0.0–0.5)
Eosinophils Relative: 1 %
HCT: 37 % — ABNORMAL LOW (ref 39.0–52.0)
Hemoglobin: 12 g/dL — ABNORMAL LOW (ref 13.0–17.0)
Immature Granulocytes: 0 %
Lymphocytes Relative: 45 %
Lymphs Abs: 2.1 10*3/uL (ref 0.7–4.0)
MCH: 31.1 pg (ref 26.0–34.0)
MCHC: 32.4 g/dL (ref 30.0–36.0)
MCV: 95.9 fL (ref 80.0–100.0)
Monocytes Absolute: 0.2 10*3/uL (ref 0.1–1.0)
Monocytes Relative: 5 %
Neutro Abs: 2.2 10*3/uL (ref 1.7–7.7)
Neutrophils Relative %: 48 %
Platelets: 237 10*3/uL (ref 150–400)
RBC: 3.86 MIL/uL — ABNORMAL LOW (ref 4.22–5.81)
RDW: 11.8 % (ref 11.5–15.5)
WBC: 4.6 10*3/uL (ref 4.0–10.5)
nRBC: 0 % (ref 0.0–0.2)

## 2019-08-22 LAB — CREATININE, SERUM
Creatinine, Ser: 1.49 mg/dL — ABNORMAL HIGH (ref 0.61–1.24)
GFR calc Af Amer: 51 mL/min — ABNORMAL LOW (ref >60)
GFR calc non Af Amer: 44 mL/min — ABNORMAL LOW (ref >60)

## 2019-08-22 LAB — URINALYSIS, ROUTINE W REFLEX MICROSCOPIC
Bilirubin Urine: NEGATIVE
Glucose, UA: 50 mg/dL — AB
Hgb urine dipstick: NEGATIVE
Ketones, ur: 20 mg/dL — AB
Leukocytes,Ua: NEGATIVE
Nitrite: NEGATIVE
Protein, ur: NEGATIVE mg/dL
Specific Gravity, Urine: 1.014 (ref 1.005–1.030)
pH: 8 (ref 5.0–8.0)

## 2019-08-22 LAB — GLUCOSE, CAPILLARY
Glucose-Capillary: 145 mg/dL — ABNORMAL HIGH (ref 70–99)
Glucose-Capillary: 69 mg/dL — ABNORMAL LOW (ref 70–99)
Glucose-Capillary: 79 mg/dL (ref 70–99)
Glucose-Capillary: 87 mg/dL (ref 70–99)

## 2019-08-22 LAB — LACTIC ACID, PLASMA
Lactic Acid, Venous: 1.3 mmol/L (ref 0.5–1.9)
Lactic Acid, Venous: 3.5 mmol/L (ref 0.5–1.9)
Lactic Acid, Venous: 2.1 mmol/L (ref 0.5–1.9)

## 2019-08-22 LAB — CBC
HCT: 34.5 % — ABNORMAL LOW (ref 39.0–52.0)
Hemoglobin: 11.4 g/dL — ABNORMAL LOW (ref 13.0–17.0)
MCH: 31.6 pg (ref 26.0–34.0)
MCHC: 33 g/dL (ref 30.0–36.0)
MCV: 95.6 fL (ref 80.0–100.0)
Platelets: 239 10*3/uL (ref 150–400)
RBC: 3.61 MIL/uL — ABNORMAL LOW (ref 4.22–5.81)
RDW: 11.7 % (ref 11.5–15.5)
WBC: 7.1 10*3/uL (ref 4.0–10.5)
nRBC: 0 % (ref 0.0–0.2)

## 2019-08-22 LAB — COMPREHENSIVE METABOLIC PANEL
ALT: 12 U/L (ref 0–44)
AST: 17 U/L (ref 15–41)
Albumin: 3.4 g/dL — ABNORMAL LOW (ref 3.5–5.0)
Alkaline Phosphatase: 52 U/L (ref 38–126)
Anion gap: 11 (ref 5–15)
BUN: 38 mg/dL — ABNORMAL HIGH (ref 8–23)
CO2: 25 mmol/L (ref 22–32)
Calcium: 9.6 mg/dL (ref 8.9–10.3)
Chloride: 105 mmol/L (ref 98–111)
Creatinine, Ser: 1.44 mg/dL — ABNORMAL HIGH (ref 0.61–1.24)
GFR calc Af Amer: 53 mL/min — ABNORMAL LOW (ref >60)
GFR calc non Af Amer: 46 mL/min — ABNORMAL LOW (ref >60)
Glucose, Bld: 140 mg/dL — ABNORMAL HIGH (ref 70–99)
Potassium: 4.1 mmol/L (ref 3.5–5.1)
Sodium: 141 mmol/L (ref 135–145)
Total Bilirubin: 0.7 mg/dL (ref 0.3–1.2)
Total Protein: 6.5 g/dL (ref 6.5–8.1)

## 2019-08-22 LAB — CBG MONITORING, ED
Glucose-Capillary: 69 mg/dL — ABNORMAL LOW (ref 70–99)
Glucose-Capillary: 99 mg/dL (ref 70–99)

## 2019-08-22 LAB — AMMONIA: Ammonia: 31 umol/L (ref 9–35)

## 2019-08-22 LAB — MRSA PCR SCREENING: MRSA by PCR: NEGATIVE

## 2019-08-22 LAB — HEMOGLOBIN A1C
Hgb A1c MFr Bld: 5.9 % — ABNORMAL HIGH (ref 4.8–5.6)
Mean Plasma Glucose: 122.63 mg/dL

## 2019-08-22 LAB — RESPIRATORY PANEL BY RT PCR (FLU A&B, COVID)
Influenza A by PCR: NEGATIVE
Influenza B by PCR: NEGATIVE
SARS Coronavirus 2 by RT PCR: NEGATIVE

## 2019-08-22 MED ORDER — PROPOFOL 1000 MG/100ML IV EMUL
0.0000 ug/kg/min | INTRAVENOUS | Status: DC
  Administered 2019-08-22 (×2): 5 ug/kg/min via INTRAVENOUS
  Administered 2019-08-22: 16:00:00 30 ug/kg/min via INTRAVENOUS
  Filled 2019-08-22 (×2): qty 100

## 2019-08-22 MED ORDER — ETOMIDATE 2 MG/ML IV SOLN
INTRAVENOUS | Status: AC | PRN
  Administered 2019-08-22: 15 mg via INTRAVENOUS

## 2019-08-22 MED ORDER — BACLOFEN 10 MG PO TABS
10.0000 mg | ORAL_TABLET | Freq: Three times a day (TID) | ORAL | Status: DC
  Administered 2019-08-23 (×2): 10 mg
  Filled 2019-08-22 (×8): qty 1

## 2019-08-22 MED ORDER — LEVETIRACETAM IN NACL 500 MG/100ML IV SOLN
500.0000 mg | Freq: Two times a day (BID) | INTRAVENOUS | Status: DC
  Filled 2019-08-22: qty 100

## 2019-08-22 MED ORDER — PHENYTOIN SODIUM 50 MG/ML IJ SOLN
100.0000 mg | Freq: Three times a day (TID) | INTRAMUSCULAR | Status: DC
  Administered 2019-08-22 – 2019-08-25 (×8): 100 mg via INTRAVENOUS
  Filled 2019-08-22 (×9): qty 2

## 2019-08-22 MED ORDER — LEVETIRACETAM IN NACL 500 MG/100ML IV SOLN
500.0000 mg | Freq: Two times a day (BID) | INTRAVENOUS | Status: DC
  Administered 2019-08-22 – 2019-08-24 (×4): 500 mg via INTRAVENOUS
  Filled 2019-08-22 (×4): qty 100

## 2019-08-22 MED ORDER — LEVOTHYROXINE SODIUM 25 MCG PO TABS
137.0000 ug | ORAL_TABLET | Freq: Every day | ORAL | Status: DC
  Administered 2019-08-23 – 2019-08-24 (×2): 137 ug
  Filled 2019-08-22 (×5): qty 1

## 2019-08-22 MED ORDER — LEVETIRACETAM IN NACL 1000 MG/100ML IV SOLN
1000.0000 mg | Freq: Once | INTRAVENOUS | Status: AC
  Administered 2019-08-22: 09:00:00 1000 mg via INTRAVENOUS
  Filled 2019-08-22: qty 100

## 2019-08-22 MED ORDER — MIDAZOLAM HCL 2 MG/2ML IJ SOLN
1.0000 mg | INTRAMUSCULAR | Status: DC | PRN
  Administered 2019-08-22: 13:00:00 1 mg via INTRAVENOUS

## 2019-08-22 MED ORDER — HEPARIN SODIUM (PORCINE) 5000 UNIT/ML IJ SOLN
5000.0000 [IU] | Freq: Three times a day (TID) | INTRAMUSCULAR | Status: DC
  Administered 2019-08-22 – 2019-08-27 (×16): 5000 [IU] via SUBCUTANEOUS
  Filled 2019-08-22 (×17): qty 1

## 2019-08-22 MED ORDER — ROCURONIUM BROMIDE 50 MG/5ML IV SOLN
INTRAVENOUS | Status: AC | PRN
  Administered 2019-08-22: 60 mg via INTRAVENOUS

## 2019-08-22 MED ORDER — MIDAZOLAM HCL 2 MG/2ML IJ SOLN
INTRAMUSCULAR | Status: AC
  Administered 2019-08-22: 2 mg
  Filled 2019-08-22: qty 2

## 2019-08-22 MED ORDER — DEXTROSE 50 % IV SOLN
INTRAVENOUS | Status: AC
  Filled 2019-08-22: qty 50

## 2019-08-22 MED ORDER — ORAL CARE MOUTH RINSE
15.0000 mL | OROMUCOSAL | Status: DC
  Administered 2019-08-22 – 2019-08-24 (×10): 15 mL via OROMUCOSAL

## 2019-08-22 MED ORDER — LACTATED RINGERS IV SOLN: INTRAVENOUS | Status: DC

## 2019-08-22 MED ORDER — MIDAZOLAM HCL 2 MG/2ML IJ SOLN
1.0000 mg | INTRAMUSCULAR | Status: DC | PRN
  Filled 2019-08-22: qty 2

## 2019-08-22 MED ORDER — DEXTROSE 50 % IV SOLN
25.0000 mL | Freq: Once | INTRAVENOUS | Status: AC
  Administered 2019-08-22: 25 mL via INTRAVENOUS

## 2019-08-22 MED ORDER — DEXTROSE 50 % IV SOLN
INTRAVENOUS | Status: AC | PRN
  Administered 2019-08-22: 25 mL via INTRAVENOUS

## 2019-08-22 MED ORDER — ATORVASTATIN CALCIUM 10 MG PO TABS
10.0000 mg | ORAL_TABLET | Freq: Every day | ORAL | Status: DC
  Administered 2019-08-24: 10 mg
  Filled 2019-08-22 (×3): qty 1

## 2019-08-22 MED ORDER — PANTOPRAZOLE SODIUM 40 MG IV SOLR
40.0000 mg | Freq: Every day | INTRAVENOUS | Status: DC
  Administered 2019-08-22 – 2019-08-24 (×3): 40 mg via INTRAVENOUS
  Filled 2019-08-22 (×3): qty 40

## 2019-08-22 MED ORDER — FENTANYL 2500MCG IN NS 250ML (10MCG/ML) PREMIX INFUSION
0.0000 ug/h | INTRAVENOUS | Status: DC
  Administered 2019-08-22: 08:00:00 25 ug/h via INTRAVENOUS
  Filled 2019-08-22: qty 250

## 2019-08-22 MED ORDER — CHLORHEXIDINE GLUCONATE 0.12% ORAL RINSE (MEDLINE KIT)
15.0000 mL | Freq: Two times a day (BID) | OROMUCOSAL | Status: DC
  Administered 2019-08-22 – 2019-08-23 (×4): 15 mL via OROMUCOSAL

## 2019-08-22 MED ORDER — INSULIN ASPART 100 UNIT/ML ~~LOC~~ SOLN: 0.0000 [IU] | SUBCUTANEOUS | Status: DC

## 2019-08-22 MED ORDER — SODIUM CHLORIDE 0.9 % IV SOLN
20.0000 mg/kg | Freq: Once | INTRAVENOUS | Status: DC
  Filled 2019-08-22: qty 27.2

## 2019-08-22 NOTE — ED Notes (Signed)
Pt has reached for his trach tube multiple times so far, fentanyl has been increased, versed has been given, and family is in the waiting room and will be called upon to bedside. Pt is alert now, eyes open, squeezes hands to command.

## 2019-08-22 NOTE — ED Provider Notes (Addendum)
[place] EMERGENCY DEPARTMENT Provider Note   CSN: [id number] Arrival date & time: 08/22/19  7654     History Chief Complaint  Patient presents with  . Unresponsive    Willie [name] is a 79 y.o. male.  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  He is on Keppra.  Reportedly, he was found unresponsive at around 6:00 this morning at his nursing facility, [facility].  He was transported here by EMS.  EMS had found him unresponsive although occasionally he would respond to painful stimuli.  He did have some noted seizure activity in route to the hospital and was given Versed.  He was hypoventilating and is ventilations were being assisted with the bag-valve-mask.  He was maintaining normal oxygen saturations in the upper 90s.  Nursing home does not report any recent falls or illnesses.  History is limited due to patient's unresponsive state.  Reportedly, at baseline he is alert and communicative.  He does get around in a wheelchair.        Past Medical History:  Diagnosis Date  . Alcohol abuse   . Benign prostatic hypertrophy   . Dementia [facility])    daughter-in-law does not know of this   . Diabetes mellitus   . Encephalopathy   . GERD (gastroesophageal reflux disease)   . History of tarsorrhaphy    right eye  . Hypertension   . Hypothyroidism   . Pneumonia   . Prostate cancer ([place])   . Stroke [facility])    right eye does not shut all the way-tarsorrhaphy    Patient Active Problem List   Diagnosis Date Noted  . Malignant neoplasm of prostate ([place]) 01/21/2019  . Essential hypertension 01/01/2019  . Generalized weakness 01/01/2019  . Acute metabolic encephalopathy 65/08/5463  . Controlled type 2 diabetes mellitus with hypoglycemia ([place]) 12/31/2018  . Hypothyroidism 12/31/2018  .  Normocytic normochromic anemia 12/31/2018  . Hyponatremia 12/31/2018    Past Surgical History:  Procedure Laterality Date  . COLONOSCOPY WITH PROPOFOL N/A 08/24/2014   Procedure: COLONOSCOPY WITH PROPOFOL;  Surgeon: Garlan Fair, MD;  Location: [facility] ENDOSCOPY;  Service: Endoscopy;  Laterality: N/A;  . EYE SURGERY     Laser eye surgery  . GOLD SEED IMPLANT N/A 04/18/2019   Procedure: GOLD SEED IMPLANT;  Surgeon: Kathie Rhodes, MD;  Location: [facility] ORS;  Service: Urology;  Laterality: N/A;  . PROSTATE BIOPSY    . SPACE OAR INSTILLATION N/A 04/18/2019   Procedure: SPACE OAR INSTILLATION;  Surgeon: Kathie Rhodes, MD;  Location: [facility] ORS;  Service: Urology;  Laterality: N/A;       Family History  Problem Relation Age of Onset  . Prostate cancer Brother   . Colon cancer Brother   . Breast cancer Neg Hx   . Pancreatic cancer Neg Hx     Social History   Tobacco Use  . Smoking status: Never Smoker  . Smokeless tobacco: Never Used  Substance Use Topics  . Alcohol use: Not Currently  . Drug use: No    Home Medications Prior to Admission medications   Medication Sig Start Date End Date Taking? Authorizing Provider  acetaminophen (TYLENOL) 325 MG tablet Take 650 mg by mouth every 6 (six) hours as needed for moderate pain.   Yes [provider]  atorvastatin (LIPITOR) 10 MG tablet Take 10 mg by  mouth daily.    Yes [provider]  baclofen (LIORESAL) 10 MG tablet Take 10 mg by mouth 3 (three) times daily. 06/14/19  Yes [provider]  bisacodyl (DULCOLAX) 5 MG EC tablet Take 10 mg by mouth daily.    Yes [provider]  Calcium Carbonate-Vitamin D3 (CALCIUM 600/VITAMIN D) 600-400 MG-UNIT TABS Take 1 tablet by mouth 2 (two) times daily.   Yes [provider]  Carboxymethylcell-Hypromellose (GENTEAL) 0.25-0.3 % GEL Place 1 application into the right eye 2 (two) times daily.   Yes [provider]  Carboxymethylcellul-Glycerin (REFRESH  OPTIVE) 1-0.9 % GEL Place 1 drop into the right eye every 2 (two) hours as needed (dry eye).   Yes [provider]  irbesartan (AVAPRO) 75 MG tablet Take 75 mg by mouth daily.    Yes [provider]  levETIRAcetam (KEPPRA) 250 MG tablet Take 250 mg by mouth 2 (two) times daily.   Yes [provider]  levothyroxine (SYNTHROID) 137 MCG tablet Take 137 mcg by mouth daily before breakfast.   Yes [provider]  loperamide (IMODIUM A-D) 2 MG tablet Take 4 mg by mouth See admin instructions. Take 2 tablets (8m) by mouth after 1st loose stool, then 1 tablet after each loose stool **do not exceed 8 mg in 24 hours*   Yes [provider]  magnesium citrate SOLN Take 1 Bottle by mouth once as needed for severe constipation.   Yes [provider]  Melatonin 1 MG TABS Take 1 mg by mouth at bedtime as needed (insomnia).    Yes [provider]  Memantine HCl-Donepezil HCl (NAMZARIC) 28-10 MG CP24 Take 1 capsule by mouth daily.   Yes [provider]  metFORMIN (GLUCOPHAGE) 500 MG tablet Take 500 mg by mouth daily with breakfast.    Yes [provider]  metoprolol succinate (TOPROL-XL) 25 MG 24 hr tablet Take 25 mg by mouth daily. Take with or immediately following a meal.    Yes [provider]  mirabegron ER (MYRBETRIQ) 50 MG TB24 tablet Take 50 mg by mouth daily.    Yes [provider]  Multiple Vitamins-Minerals (MULTIVITAMIN ADULTS) TABS Take 1 tablet by mouth daily.   Yes [provider]  Polyethyl Glyc-Propyl Glyc PF (SYSTANE ULTRA PF) 0.4-0.3 % SOLN Place 1 drop into the right eye every 4 (four) hours. While the patient is awake   Yes [provider]  polyethylene glycol (MIRALAX / GLYCOLAX) 17 g packet Take 17 g by mouth daily as needed for moderate constipation.   Yes [provider]  Sennosides (EX-LAX) 15 MG CHEW Chew 1 tablet by mouth 2 (two) times daily as needed (constipation).    Yes [provider]  sennosides-docusate sodium (SENOKOT-S) 8.6-50 MG tablet Take 1 tablet by mouth every morning.   Yes [provider]  sodium chloride 1 g tablet Take 1 g by mouth daily.   Yes [provider]  solifenacin (VESICARE) 10 MG tablet Take 10 mg by mouth daily.    Yes [provider]  traZODone (DESYREL) 100 MG tablet Take 100 mg by mouth at bedtime. 06/14/19  Yes [provider]  Vitamin E 400 units TABS Take 400 Units by mouth daily.    Yes [provider]  Blood Glucose Monitoring Suppl (ONETOUCH VERIO) w/Device KIT  12/31/18   [provider]  Lancets (ONETOUCH DELICA PLUS [id number] [place] 12/31/18   [provider]  [facility] test strip  12/31/18   [provider]    Allergies    Aspirin  Review of Systems   Review of Systems  Unable to perform ROS: Mental status change    Physical Exam Updated Vital Signs BP (!) 171/89   Pulse 77   Resp 12   Ht '5\' 11"'  (1.803 m)   Wt 68 kg   SpO2 100%   BMI 20.91 kg/m  Temperature 96.1 rectally Physical Exam Constitutional:      Comments: unresponsive  HENT:     Head: Normocephalic and atraumatic.     Mouth/Throat:     Mouth: Mucous membranes are dry.  Eyes:     Comments: Pupils midpoint  Cardiovascular:     Rate and Rhythm: Normal rate.     Pulses: Normal pulses.     Heart sounds: No murmur.  Pulmonary:     Breath sounds: Rhonchi present. No wheezing or rales.     Comments: Hypoventilating, rhonchi Abdominal:     General: Abdomen is flat. There is no distension.     Tenderness: There is no guarding.  Musculoskeletal:        General: No swelling. Normal range of motion.     Cervical back: Neck supple.  Skin:    General: Skin is warm and dry.  Neurological:     Comments: Will respond to painful stimuli.  Has some grip strength bilaterally     ED Results / Procedures / Treatments   Labs (all labs ordered are listed, but only  abnormal results are displayed) Labs Reviewed  COMPREHENSIVE METABOLIC PANEL - Abnormal; Notable for the following components:      Result Value   Glucose, Bld 140 (*)    BUN 38 (*)    Creatinine, Ser 1.44 (*)    Albumin 3.4 (*)    GFR calc non Af Amer 46 (*)    GFR calc Af Amer 53 (*)    All other components within normal limits  CBC WITH DIFFERENTIAL/PLATELET - Abnormal; Notable for the following components:   RBC 3.86 (*)    Hemoglobin 12.0 (*)    HCT 37.0 (*)    All other components within normal limits  CBG MONITORING, ED - Abnormal; Notable for the following components:   Glucose-Capillary 69 (*)    All other components within normal limits  POCT I-STAT EG7 - Abnormal; Notable for the following components:   pO2, Ven 53.0 (*)    Acid-Base Excess 3.0 (*)    HCT 35.0 (*)    Hemoglobin 11.9 (*)    All other components within normal limits  RESPIRATORY PANEL BY RT PCR (FLU A&B, COVID)  CULTURE, BLOOD (ROUTINE X 2)  CULTURE, BLOOD (ROUTINE X 2)  URINE CULTURE  LACTIC ACID, PLASMA  AMMONIA  URINALYSIS, ROUTINE W REFLEX MICROSCOPIC  I-STAT VENOUS BLOOD GAS, ED    EKG EKG Interpretation  Date/Time:  Friday August 22 2019 07:13:40 EST Ventricular Rate:  77 PR Interval:    QRS Duration: 142 QT Interval:  441 QTC Calculation: 500 R Axis:   70 Text Interpretation: Sinus rhythm Right bundle branch block Confirmed by Malvin Johns [phone]) on 08/22/2019 8:38:15 AM    ED ECG REPORT   Date: 08/22/2019  Rate: 77  Rhythm: normal sinus rhythm  QRS Axis: normal  Intervals: normal  ST/T Wave abnormalities: nonspecific ST/T changes  Conduction Disutrbances:right bundle branch block  Narrative Interpretation:   Old EKG Reviewed: unchanged  I have personally reviewed the EKG tracing and agree  with the computerized printout as noted.   Radiology CT HEAD WO CONTRAST  Result Date: 08/22/2019 CLINICAL DATA:  Unresponsive patient EXAM: CT HEAD WITHOUT CONTRAST TECHNIQUE:  Contiguous axial images were obtained from the base of the skull through the vertex without intravenous contrast. COMPARISON:  07/22/2019 FINDINGS: Brain: A The brainstem, cerebellum, cerebral peduncles, thalami, basal ganglia, basilar cisterns, and ventricular system appear within normal limits. Periventricular white matter and corona radiata hypodensities favor chronic ischemic microvascular white matter disease. No intracranial hemorrhage, mass lesion, or acute CVA. Vascular: There is atherosclerotic calcification of the cavernous carotid arteries bilaterally. Skull: Unremarkable Sinuses/Orbits: Chronic ethmoid sinusitis. Other: No supplemental non-categorized findings. IMPRESSION: 1. No acute intracranial findings. 2. Periventricular white matter and corona radiata hypodensities favor chronic ischemic microvascular white matter disease. 3. Chronic ethmoid sinusitis. Electronically Signed   By: Van Clines M.D.   On: 08/22/2019 08:40   DG Chest Port 1 View  Result Date: 08/22/2019 CLINICAL DATA:  Found unresponsive. EXAM: PORTABLE CHEST 1 VIEW COMPARISON:  07/16/2016 FINDINGS: The endotracheal tube is 3 cm above the carina. The cardiac silhouette, mediastinal and hilar contours are within normal limits and stable. Moderate calcification of the thoracic aorta. The lungs are clear. No pleural effusions or pneumothorax. The bony thorax is intact. IMPRESSION: 1. Endotracheal tube in good position. 2. No acute cardiopulmonary findings. Electronically Signed   By: Marijo Sanes M.D.   On: 08/22/2019 07:56    Procedures Procedures (including critical care time)  Medications Ordered in ED Medications  dextrose 50 % solution (  Canceled Entry 08/22/19 0734)  fentaNYL 2529mg in NS 2568m(1017mml) infusion-PREMIX (300 mcg/hr Intravenous Rate/Dose Change 08/22/19 0822)  propofol (DIPRIVAN) 1000 MG/100ML infusion (has no administration in time range)  dextrose 50 % solution (25 mLs Intravenous Given  08/22/19 0714)  etomidate (AMIDATE) injection (15 mg Intravenous Given 08/22/19 0715)  rocuronium (ZEMURON) injection (60 mg Intravenous Given 08/22/19 0716)  levETIRAcetam (KEPPRA) IVPB 1000 mg/100 mL premix (0 mg Intravenous Stopped 08/22/19 0915)  midazolam (VERSED) 2 MG/2ML injection (2 mg  Given 08/22/19 0825)    ED Course  I have reviewed the triage vital signs and the nursing notes.  Pertinent labs & imaging results that were available during my care of the patient were reviewed by me and considered in my medical decision making (see chart for details).    MDM Rules/Calculators/A&P                      Patient is a 78 22ar old male who presents with altered mental status.  He was essentially unresponsive on arrival and was intubated for airway protection.  He was intubated by the PA-C under my direct supervision.  He was started on a fentanyl drip for sedation.  He was noted to have a CBG of 69 which had dropped from the EMS CBG.  He was given half amp of D50.  During the ED course he did have some episodes where he started shaking.  It is hard to tell whether these are tremors or actual seizures.  He will grip your hands during these episodes.  He was given some Versed and loaded with Keppra.  His head CT shows no acute abnormalities.  His labs so far are nonconcerning.  His family is at bedside now and states that he has been having these shaking episodes for about the last 3 to 4 weeks and his PCP has recently started him on Keppra.  He has been wheelchair-bound since  about December.  I have spoken to the intensivist on-call who will admit the patient to the ICU for further treatment.  The patient does seem to be waking up a little bit more and is more alert and responsive than he was on arrival.  I also have consulted Dr. Cheral Marker with neurology to evaluate the patient.  CRITICAL CARE Performed by: Malvin Johns Total critical care time: 45 minutes Critical care time was exclusive of  separately billable procedures and treating other patients. Critical care was necessary to treat or prevent imminent or life-threatening deterioration. Critical care was time spent personally by me on the following activities: development of treatment plan with patient and/or surrogate as well as nursing, discussions with consultants, evaluation of patient's response to treatment, examination of patient, obtaining history from patient or surrogate, ordering and performing treatments and interventions, ordering and review of laboratory studies, ordering and review of radiographic studies, pulse oximetry and re-evaluation of patient's condition.   Final Clinical Impression(s) / ED Diagnoses Final diagnoses:  Altered mental status, unspecified altered mental status type  Seizure-like activity [facility])    Rx / Central Orders ED Discharge Orders    None       Malvin Johns, MD 08/22/19 6415    Malvin Johns, MD 08/22/19 0900    Malvin Johns, MD 08/22/19 [phone]

## 2019-08-22 NOTE — ED Notes (Signed)
Pt has been completley alert & while EEG is at bedside he is making arm movements like he is boxing or fighting.

## 2019-08-22 NOTE — ED Triage Notes (Signed)
Pt was found unresponsive at [facility] where he lives, EMS reports no exact known timeframe of pt being unresponsive, staff reports he is A/Ox4 at baseline with use of w/c for transferring. Upon EMS arrival on scene pt was 100 % on 2L, 157/83, with a resp of 6. In route to ED pt had seizure activity (per EMS) & was given 2.5 of midazolam in his 18G/Rt AC PIV. Upon arrival to ED pt was being bagged, remained unresponsive and shortly after began moving his Lt arm & grimacing, eyes were open and moving but not focusing. Intubated upon arrival.

## 2019-08-22 NOTE — ED Provider Notes (Signed)
  Physical Exam  There were no vitals taken for this visit.  Physical Exam  ED Course/Procedures     Procedure Name: Intubation Date/Time: 08/22/2019 7:32 AM Performed by: Franchot Heidelberg, PA-C Pre-anesthesia Checklist: Patient identified, Emergency Drugs available, Suction available, Patient being monitored and Timeout performed Oxygen Delivery Method: Ambu bag Preoxygenation: Pre-oxygenation with 100% oxygen Induction Type: Rapid sequence Ventilation: Mask ventilation without difficulty Laryngoscope Size: Glidescope Grade View: Grade II Tube size: 7.5 mm Number of attempts: 1 Airway Equipment and Method: Video-laryngoscopy Placement Confirmation: ETT inserted through vocal cords under direct vision,  Positive ETCO2 and Breath sounds checked- equal and bilateral Secured at: 24 cm Tube secured with: ETT holder        MDM            Franchot Heidelberg, PA-C 08/22/19 0757    Malvin Johns, MD 08/22/19 [phone]

## 2019-08-22 NOTE — Code Documentation (Addendum)
Pt is now intubated/sedated, 180/81, 93 bpm, 13 resp, spO2 100%.

## 2019-08-22 NOTE — ED Notes (Signed)
Neurology NP at bedside.

## 2019-08-22 NOTE — Procedures (Signed)
Patient Name: Willie [name]  MRN: [id number]  Epilepsy Attending: Lora Havens  Referring Physician/Provider: Laurey Morale, NP Date: 08/22/2019 Duration: 26.08 minutes  Patient history: 78 year old male who was last seen in ED for shaking and worsening cognition in the setting of dementia who presented again with altered mental status and concern for seizures.  EEG to evaluate for seizures.  Level of alertness: Awake  AEDs during EEG study: Versed  Technical aspects: This EEG study was done with scalp electrodes positioned according to the 10-20 International system of electrode placement. Electrical activity was acquired at a sampling rate of 500Hz  and reviewed with a high frequency filter of 70Hz  and a low frequency filter of 1Hz . EEG data were recorded continuously and digitally stored.   Description: During awake state, no clear posterior dominant rhythm was seen.  EEG showed continuous generalized 5 to 7 Hz theta activity.  Hyperventilation photic stimulation were not performed.  Abnormality -Continuous slow, generalized  IMPRESSION: This study is suggestive of mild to moderate diffuse encephalopathy, nonspecific to etiology.  No seizures or epileptiform discharges were seen throughout the recording.  [name] [name]

## 2019-08-22 NOTE — ED Notes (Signed)
Attempted report to 39M.

## 2019-08-22 NOTE — ED Notes (Signed)
MD Lindzen at bedside & told this RN to Stop Propofol so he can see how he presents when he is more alert.

## 2019-08-22 NOTE — Consult Note (Addendum)
NEURO HOSPITALIST CONSULT NOTE   Requesting physician: Dr. Tamera Punt  Reason for Consult: Unresponsiveness with possible seizures.   History obtained from:  Family and Chart Review  HPI:                                                                                                                                          Willie [name] is an 79 y.o. male who was last seen in the ED on 2/9 for shaking and worsening cognition in the setting of dementia. He was diagnosed with dehydration and treated with 1 L IVF followed by improvement and subsequent discharge home.   He re-presented this AM after being found unresponsive at the [facility] facility where he lives. EMS reported that there was no clear time given for LKN. The staff did report that at baseline he is alert and oriented x 4, able to use wheelchair during transfers. On scene his O2 saturation was 100 % on 2L, BP 157/83, but with a RR of 6. He had a spell which was described as a seizure by EMS en route to the ED and was administered 2.5 mg of Versed. He was being bagged on arrival to the ED and continued to be unresponsive, shortly afterwards began moving his LUE and grimacing with eyes open and with some EOM observed by staff, but not apparently focusing on visual stimuli. He was intubated upon arrival. After intubation he has been noted to be reaching for his endotracheal tube multiple times, requiring Versed and fentanyl sedation. By 8:34 AM he was documented as being "alert" with eyes open and squeezing hands to command.   Per family: he started complaining of these shaking/shivering episodes in December. He began his radiation treatment for prostate cancer in November. When he started radiation he was using a walker to ambulate. He is now wheelchair bound since December and he had been complaining his legs feeling spongy prior to progressing to the need to use a wheelchair. Per daughter in law she felt that his  walking progressively became worse in parallel with the progression of the 30-day course radiation treatments. Patient was also recently moved from the independent living side to the assisted living side of his care facility which he was unhappy to do. This move came in August after he was noted by family to be driving around town applying to different apartment complexes and leaving multiple deposits - they felt that this was due to a combination of behavioral change and impaired memory. His PCP and family felt that he needed more supervision. There is also a question of possible hallucinations.  He was recently started on Keppra 250 mg BID by his PCP for the episodes of trembling/shaking. He has been having shaking spells for about the past  4 weeks. His PMHx includes EtOH abuse, dementia, DM, HTN, hypothyroidism and stroke.   Past Medical History:  Diagnosis Date  . Alcohol abuse   . Benign prostatic hypertrophy   . Dementia [facility])    daughter-in-law does not know of this   . Diabetes mellitus   . Encephalopathy   . GERD (gastroesophageal reflux disease)   . History of tarsorrhaphy    right eye  . Hypertension   . Hypothyroidism   . Pneumonia   . Prostate cancer ([place])   . Stroke [facility])    right eye does not shut all the way-tarsorrhaphy    Past Surgical History:  Procedure Laterality Date  . COLONOSCOPY WITH PROPOFOL N/A 08/24/2014   Procedure: COLONOSCOPY WITH PROPOFOL;  Surgeon: Garlan Fair, MD;  Location: [facility] ENDOSCOPY;  Service: Endoscopy;  Laterality: N/A;  . EYE SURGERY     Laser eye surgery  . GOLD SEED IMPLANT N/A 04/18/2019   Procedure: GOLD SEED IMPLANT;  Surgeon: Kathie Rhodes, MD;  Location: [facility] ORS;  Service: Urology;  Laterality: N/A;  . PROSTATE BIOPSY    . SPACE OAR INSTILLATION N/A 04/18/2019   Procedure: SPACE OAR INSTILLATION;  Surgeon: Kathie Rhodes, MD;  Location: [facility] ORS;  Service: Urology;  Laterality: N/A;    Family History  Problem Relation Age of Onset   . Prostate cancer Brother   . Colon cancer Brother   . Breast cancer Neg Hx   . Pancreatic cancer Neg Hx               Social History:  reports that he has never smoked. He has never used smokeless tobacco. He reports previous alcohol use. He reports that he does not use drugs.  Allergies  Allergen Reactions  . Aspirin Other (See Comments)    Had stroke in March last year. No more aspirin.  PER MAR - pt has no known allergies     HOME MEDICATIONS:                                                                                                                      No current facility-administered medications on file prior to encounter.   Current Outpatient Medications on File Prior to Encounter  Medication Sig Dispense Refill  . acetaminophen (TYLENOL) 325 MG tablet Take 650 mg by mouth every 6 (six) hours as needed for moderate pain.    Marland Kitchen atorvastatin (LIPITOR) 10 MG tablet Take 10 mg by mouth daily.     . baclofen (LIORESAL) 10 MG tablet Take 10 mg by mouth 3 (three) times daily.    . bisacodyl (DULCOLAX) 5 MG EC tablet Take 10 mg by mouth daily.     . Calcium Carbonate-Vitamin D3 (CALCIUM 600/VITAMIN D) 600-400 MG-UNIT TABS Take 1 tablet by mouth 2 (two) times daily.    . Carboxymethylcell-Hypromellose (GENTEAL) 0.25-0.3 % GEL Place 1 application into the right eye 2 (two) times daily.    . Carboxymethylcellul-Glycerin (REFRESH OPTIVE) 1-0.9 % GEL Place  1 drop into the right eye every 2 (two) hours as needed (dry eye).    . irbesartan (AVAPRO) 75 MG tablet Take 75 mg by mouth daily.     Marland Kitchen levETIRAcetam (KEPPRA) 250 MG tablet Take 250 mg by mouth 2 (two) times daily.    Marland Kitchen levothyroxine (SYNTHROID) 137 MCG tablet Take 137 mcg by mouth daily before breakfast.    . loperamide (IMODIUM A-D) 2 MG tablet Take 4 mg by mouth See admin instructions. Take 2 tablets (56m) by mouth after 1st loose stool, then 1 tablet after each loose stool **do not exceed 8 mg in 24 hours*    . magnesium  citrate SOLN Take 1 Bottle by mouth once as needed for severe constipation.    . Melatonin 1 MG TABS Take 1 mg by mouth at bedtime as needed (insomnia).     . Memantine HCl-Donepezil HCl (NAMZARIC) 28-10 MG CP24 Take 1 capsule by mouth daily.    . metFORMIN (GLUCOPHAGE) 500 MG tablet Take 500 mg by mouth daily with breakfast.     . metoprolol succinate (TOPROL-XL) 25 MG 24 hr tablet Take 25 mg by mouth daily. Take with or immediately following a meal.     . mirabegron ER (MYRBETRIQ) 50 MG TB24 tablet Take 50 mg by mouth daily.     . Multiple Vitamins-Minerals (MULTIVITAMIN ADULTS) TABS Take 1 tablet by mouth daily.    .Vladimir FasterGlyc-Propyl Glyc PF (SYSTANE ULTRA PF) 0.4-0.3 % SOLN Place 1 drop into the right eye every 4 (four) hours. While the patient is awake    . polyethylene glycol (MIRALAX / GLYCOLAX) 17 g packet Take 17 g by mouth daily as needed for moderate constipation.    . Sennosides (EX-LAX) 15 MG CHEW Chew 1 tablet by mouth 2 (two) times daily as needed (constipation).    . sennosides-docusate sodium (SENOKOT-S) 8.6-50 MG tablet Take 1 tablet by mouth every morning.    . sodium chloride 1 g tablet Take 1 g by mouth daily.    . solifenacin (VESICARE) 10 MG tablet Take 10 mg by mouth daily.     . traZODone (DESYREL) 100 MG tablet Take 100 mg by mouth at bedtime.    . Vitamin E 400 units TABS Take 400 Units by mouth daily.     . Blood Glucose Monitoring Suppl (ONETOUCH VERIO) w/Device KIT     . Lancets (ONETOUCH DELICA PLUS [id number] MISC     . ONETOUCH VERIO test strip        ROS:                                                                                                                                        unobtainable from patient due to intubation/ sedation  Blood pressure (!) 154/84, pulse 75, resp. rate 18, height '5\' 11"'  (1.803 m), SpO2 100 %.  General Examination:  Physical Exam  HEENT-  Normocephalic, no lesions, without obvious abnormality.  Normal external eye and conjunctiva. Right eye with tarsorrhaphy.   Cardiovascular- S1-S2 audible, pulses palpable throughout   Lungs-no rhonchi or wheezing noted, no excessive working breathing.  Saturations within normal limits. Patient intubated Abdomen- All 4 quadrants palpated and nontender Extremities- Warm, dry and intact Musculoskeletal-no joint tenderness, deformity or swelling Skin-warm and dry, no hyperpigmentation, vitiligo, or suspicious lesions  Neurological Examination Mental Status: Alert, awake. Kept reaching and trying to extubate himself. Was able to follow simple commands. Breathing above vent.  Cranial Nerves: II: Blinks to threat bilaterally. PERRL.  III,IV, VI: tarsorrhaphy of right eye lid, extra-ocular motions intact bilaterally  Face appears symmetric in the setting of ETT, hearing normal  Motor: Able to move all 4 extremities antigravity to command and spontaneously. Tone and bulk: Normal tone throughout; no atrophy noted Sensory:  Light touch intact throughout, bilaterally Deep Tendon Reflexes: 2+ and symmetric throughout Plantars: Right: downgoing  Left: downgoing Cerebellar: No ataxia noted Gait: Deferred   Lab Results: Basic Metabolic Panel: Recent Labs  Lab 08/22/19 0746  NA 143  K 4.0    CBC: Recent Labs  Lab 08/22/19 0726 08/22/19 0746  WBC 4.6  --   NEUTROABS 2.2  --   HGB 12.0* 11.9*  HCT 37.0* 35.0*  MCV 95.9  --   PLT 237  --      Imaging: CT HEAD WO CONTRAST  Result Date: 08/22/2019 CLINICAL DATA:  Unresponsive patient EXAM: CT HEAD WITHOUT CONTRAST TECHNIQUE: Contiguous axial images were obtained from the base of the skull through the vertex without intravenous contrast. COMPARISON:  07/22/2019 FINDINGS: Brain: A The brainstem, cerebellum, cerebral peduncles, thalami, basal ganglia, basilar cisterns, and ventricular system appear within normal  limits. Periventricular white matter and corona radiata hypodensities favor chronic ischemic microvascular white matter disease. No intracranial hemorrhage, mass lesion, or acute CVA. Vascular: There is atherosclerotic calcification of the cavernous carotid arteries bilaterally. Skull: Unremarkable Sinuses/Orbits: Chronic ethmoid sinusitis. Other: No supplemental non-categorized findings. IMPRESSION: 1. No acute intracranial findings. 2. Periventricular white matter and corona radiata hypodensities favor chronic ischemic microvascular white matter disease. 3. Chronic ethmoid sinusitis. Electronically Signed   By: Van Clines M.D.   On: 08/22/2019 08:40   DG Chest Port 1 View  Result Date: 08/22/2019 CLINICAL DATA:  Found unresponsive. EXAM: PORTABLE CHEST 1 VIEW COMPARISON:  07/16/2016 FINDINGS: The endotracheal tube is 3 cm above the carina. The cardiac silhouette, mediastinal and hilar contours are within normal limits and stable. Moderate calcification of the thoracic aorta. The lungs are clear. No pleural effusions or pneumothorax. The bony thorax is intact. IMPRESSION: 1. Endotracheal tube in good position. 2. No acute cardiopulmonary findings. Electronically Signed   By: Marijo Sanes M.D.   On: 08/22/2019 07:56    Laurey Morale, MSN, NP-C [facility] [phone]   Assessment:  79 year old male with shaking spells and AMS 1. On exam (prior to propofol initiation) patient able to follow simple commands. Also prior to propofol, Neurology team witnessed 4 shaking episodes that happened over the course of 2 minutes. Patient was able to respond during these shaking episodes. Eyes did not deviate, patient did not lose consciousness. Episodes began with low amplitude shaking that gradually increased over about 15 seconds. Shaking would cease, and then start up again following the same pattern of increasing amplitude. Seemed to be mostly arms, then progressed to legs. 2. Started on  [place] by his PCP for  shaking spells over the past 3-4 weeks.   3. EEG in the ED: This study is suggestive of mild to moderate diffuse encephalopathy, nonspecific to etiology.  No seizures or epileptiform discharges were seen throughout the recording. 4. CT head: No acute intracranial findings. Periventricular white matter and corona radiata hypodensities favor chronic ischemic microvascular white matter disease.  5. MRI brain shows no acute abnormality. Diffuse atrophy and chronic microvascular ischemic changes are noted.   Recommendations: 1. Loaded with 1000 mg Keppra IV in the ED.  2. Initial EEG does not rule out the presence of possible seizures. Would be most useful if we could capture a spell. LTM EEG is being ordered.  3. Hold any additional Keppra doses for now.   I have seen and examined the patient. I have interviewed family at the bedside. I have formulated the assessment and recommendations. My exam findings were documented and observed by Laurey Morale, NP Electronically signed: Dr. Kerney Elbe 08/22/2019, 8:54 AM

## 2019-08-22 NOTE — ED Notes (Signed)
ICU PA Babcock at bedside speaking to family.

## 2019-08-22 NOTE — Progress Notes (Signed)
eLink Physician-Brief Progress Note Patient Name: FONZO COMBEST DOB: February 13, 1941 MRN: [id number]   Date of Service  08/22/2019  HPI/Events of Note  Agitation - Patient calm on video assessment.   eICU Interventions  Continue present management.     Intervention Category Major Interventions: Delirium, psychosis, severe agitation - evaluation and management  Lysle Dingwall 08/22/2019, 9:57 PM

## 2019-08-22 NOTE — ED Notes (Signed)
EDP Dr. Tamera Punt at bedside with pt's son & daughter-in-law.

## 2019-08-22 NOTE — Progress Notes (Signed)
Pt extubated to 4L. Moving air well, lungs sound clear, following commands

## 2019-08-22 NOTE — Progress Notes (Signed)
EEG complete - results pending 

## 2019-08-22 NOTE — Procedures (Signed)
Extubation Procedure Note  Patient Details:   Name: Willie [name] DOB: 18-Jun-1940 MRN: [id number]   Airway Documentation:  Airway (Active)   Vent end date: 08/22/19 Vent end time: 1833   Evaluation  O2 sats: stable throughout Complications: No apparent complications Patient did tolerate procedure well. Bilateral Breath Sounds: Clear, Diminished  Pateint able to speak Yes Pateint has some confusion post extubation. Daughter states he has early onset dementia and is close to base line at this time Rudene Re 08/22/2019, 6:33 PM

## 2019-08-22 NOTE — ED Notes (Signed)
EEG at bedside.

## 2019-08-22 NOTE — H&P (Signed)
NAME:  Willie [name], MRN:  [id number], DOB:  Aug 18, 1940, LOS: 0 ADMISSION DATE:  08/22/2019, CONSULTATION DATE: 3/12  REFERRING MD:  Willie [name], CHIEF COMPLAINT:   Acute encephalopathy  Brief History   79 year old male patient with remote history of stroke/ICH, early dementia, but oriented x4 on most days, and wheelchair-bound residing at assisted living facility admitted after being found unresponsive on 3/12.  Of note had been having seizure-like activity for the 3 weeks prior described as rhythmic bilateral upper extremity movement without loss of consciousness, had been started on Keppra. Intubated for airway protection, not long after intubation level of consciousness slowly improved in the emergency room critical care asked to admit  History of present illness   This is an 79 year old white male who resides in an assisted living facility.  He has been wheelchair-bound since December 2020 after receiving radiation therapy for prostate cancer, he he never really made much physical recovery after that.  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  There is also question about possible seizure activity approximately 3 weeks ago, this was felt to be more the sequela of dehydration by emergency room staff however bilateral frequent rhythmic arm movement without loss of consciousness was witnessed by his primary care provider and he was started on Keppra for this.  Over the last 3 weeks he has been visited frequently by his family at the assisted living facility.  He is continued to have rhythmic arm movement, which he would call shivering.  This never really improved over the last 3 weeks.  EMS was called on 3/12 To the assisted living facility when the patient was found unresponsive.  Apparently at baseline as mentioned above he is wheelchair-bound, however he is able to communicate, typically oriented, however does get confused at times.  He did  receive Versed by EMS, in route he required assisted ventilation with Ambu bag, he was intubated on arrival to the emergency room.  In the ER a CTA head was obtained this was negative for acute findings, blood cultures were sent, blood glucose was 69 there was no leukocytosis lactic acid was normal ammonia was normal, creatinine was 1.44 there was no acidosis.  Shortly after intubation the patient's level of consciousness continue to slowly improve, upon time of critical care arrival he was awake and following commands, periodically pulling at endotracheal tube critical care was asked to admit Past Medical History  Prior ICH, prostate cancer status post radiation therapy, hypothyroidism, hypertension, GERD, diabetes, early dementia  Significant Hospital Events   3/12 admitted, intubated by EDP  Consults:  Neurology consulted  Procedures:  Oral endotracheal tube 2/12  Significant Diagnostic Tests:  CT brain 2/12: No acute findings EEG 2/12 Micro Data:  Blood culture 2/12 Urine culture 2/12   Antimicrobials:     Interim history/subjective:  In the interval from admission and intubation now awake and restless at times  Objective   Blood pressure 114/89, pulse 73, resp. rate 16, height _0  (1.803 m), weight 68 kg, SpO2 100 %.    Vent Mode: PRVC FiO2 (%):  [100 %] 100 % Set Rate:  [18 bmp] 18 bmp Vt Set:  [600 mL] 600 mL PEEP:  [5 cmH20] 5 cmH20 Plateau Pressure:  [12 cmH20] 12 cmH20  No intake or output data in the 24 hours ending 08/22/19 1007 Filed Weights   08/22/19 0915  Weight: 68 kg    Examination: General:  79 year old white male currently sedated on fentanyl drip, moving all extremities, follows commands, at times tries to pull it tube HENT: Normocephalic atraumatic no jugular venous distention orally intubated Lungs: Clear to auscultation equal chest rise Cardiovascular: Regular rate and rhythm Abdomen: Soft nontender Extremities: Warm dry brisk cap refill no  edema Neuro: Awake, interactive, follows commands, moves all extremities, GU: Due to void  Resolved Hospital Problem list     Assessment & Plan:  Acute metabolic encephalopathy w/ frequent rhythmic bilateral UE movements (? Seizure).  H/o early dementia   In light of recent seizure activity suspect this likely reflects post ictal state, additionally consider hypercarbia as this would be also easily reversed with mechanical ventilation in time, or perhaps polypharmacy -Had recently been started on Keppra for seizure-like activity Plan Admit to intensive care Serial neuro assessments Propofol with as needed fentanyl with RASS goal 0 EEG Neurology consult, with assessment &  recommendations for anticonvulsants Supportive care  Acute respiratory failure in setting of ineffective airway protection.  -pcxr personally reviewed. No infiltrate -now more awake Plan Cont full vent support PAD protocol as above VAP bundle Anticipate we can initiate weaning once we have clear idea of what else Neurology deems appropriate work-up wise. (would prob keep him intubated if needs MRI as I am worried he would not tolerate it well w/out sedation)  H/o HTN Plan Holding home meds for now   Mild AKI Plan Gentle hydration Am chem Renal dose meds  Hypothyroidism  Plan Ck TSH Cont replacement  DM Plan ssi    Best practice:  Diet: NPO Pain/Anxiety/Delirium protocol (if indicated): ordered VAP protocol (if indicated): ordered DVT prophylaxis: Ub[place] heparin GI prophylaxis: ppi Glucose control: ssi Mobility: BR Code Status: full code  Family Communication: family updated  Disposition:  Neuro ICU  Labs   CBC: Recent Labs  Lab 08/22/19 0726 08/22/19 0746  WBC 4.6  --   NEUTROABS 2.2  --   HGB 12.0* 11.9*  HCT 37.0* 35.0*  MCV 95.9  --   PLT 237  --     Basic Metabolic Panel: Recent Labs  Lab 08/22/19 0726 08/22/19 0746  NA 141 143  K 4.1 4.0  CL 105  --   CO2 25  --    GLUCOSE 140*  --   BUN 38*  --   CREATININE 1.44*  --   CALCIUM 9.6  --    GFR: Estimated Creatinine Clearance: 39.4 mL/min (A) (by C-G formula based on SCr of 1.44 mg/dL (H)). Recent Labs  Lab 08/22/19 0726  WBC 4.6  LATICACIDVEN 1.3    Liver Function Tests: Recent Labs  Lab 08/22/19 0726  AST 17  ALT 12  ALKPHOS 52  BILITOT 0.7  PROT 6.5  ALBUMIN 3.4*   No results for input(s): LIPASE, AMYLASE in the last 168 hours. Recent Labs  Lab 08/22/19 0726  AMMONIA 31    ABG    Component Value Date/Time   HCO3 27.9 08/22/2019 0746   TCO2 29 08/22/2019 0746   O2SAT 87.0 08/22/2019 0746     Coagulation Profile: No results for input(s): INR, PROTIME in the last 168 hours.  Cardiac Enzymes: No results for input(s): CKTOTAL, CKMB, CKMBINDEX, TROPONINI in the last 168 hours.  HbA1C: Hgb A1c MFr Bld  Date/Time Value Ref Range Status  04/18/2019 09:30 AM 6.3 (H) 4.8 - 5.6 % Final    Comment:    (NOTE) Pre diabetes:          5.7%-6.4%  Diabetes:              >6.4% Glycemic control for   <7.0% adults with diabetes   03/24/2019 10:15 AM 6.2 (H) 4.8 - 5.6 % Final    Comment:    (NOTE) Pre diabetes:          5.7%-6.4% Diabetes:              >6.4% Glycemic control for   <7.0% adults with diabetes     CBG: Recent Labs  Lab 08/22/19 0712 08/22/19 0951  GLUCAP 69* 99    Review of Systems:   Not able  Past Medical History  He,  has a past medical history of Alcohol abuse, Benign prostatic hypertrophy, Dementia ([place]), Diabetes mellitus, Encephalopathy, GERD (gastroesophageal reflux disease), History of tarsorrhaphy, Hypertension, Hypothyroidism, Pneumonia, Prostate cancer ([place]), and Stroke ([place]).   Surgical History    Past Surgical History:  Procedure Laterality Date  . COLONOSCOPY WITH PROPOFOL N/A 08/24/2014   Procedure: COLONOSCOPY WITH PROPOFOL;  Surgeon: Garlan Fair, MD;  Location: [facility];  Service: Endoscopy;  Laterality: N/A;  . EYE  SURGERY     Laser eye surgery  . GOLD SEED IMPLANT N/A 04/18/2019   Procedure: GOLD SEED IMPLANT;  Surgeon: Kathie Rhodes, MD;  Location: [facility];  Service: Urology;  Laterality: N/A;  . PROSTATE BIOPSY    . SPACE OAR INSTILLATION N/A 04/18/2019   Procedure: SPACE OAR INSTILLATION;  Surgeon: Kathie Rhodes, MD;  Location: [facility];  Service: Urology;  Laterality: N/A;     Social History   reports that he has never smoked. He has never used smokeless tobacco. He reports previous alcohol use. He reports that he does not use drugs.   Family History   His family history includes Colon cancer in his brother; Prostate cancer in his brother. There is no history of Breast cancer or Pancreatic cancer.   Allergies Allergies  Allergen Reactions  . Aspirin Other (See Comments)    Had stroke in March last year. No more aspirin.  PER MAR - pt has no known allergies      Home Medications  Prior to Admission medications   Medication Sig Start Date End Date Taking? Authorizing Provider  acetaminophen (TYLENOL) 325 MG tablet Take 650 mg by mouth every 6 (six) hours as needed for moderate pain.   Yes [provider]  atorvastatin (LIPITOR) 10 MG tablet Take 10 mg by mouth daily.    Yes [provider]  baclofen (LIORESAL) 10 MG tablet Take 10 mg by mouth 3 (three) times daily. 06/14/19  Yes [provider]  bisacodyl (DULCOLAX) 5 MG EC tablet Take 10 mg by mouth daily.    Yes [provider]  irbesartan (AVAPRO) 75 MG tablet Take 75 mg by mouth daily.    Yes [provider]  levETIRAcetam (KEPPRA) 250 MG tablet Take 250 mg by mouth 2 (two) times daily.   Yes [provider]  levothyroxine (SYNTHROID) 137 MCG tablet Take 137 mcg by mouth daily before breakfast.   Yes [provider]  loperamide (IMODIUM A-D) 2 MG tablet Take 4 mg by mouth See admin instructions. Take 2 tablets (42m) by mouth after 1st loose stool, then 1 tablet after each loose  stool **do not exceed 8 mg in 24 hours*   Yes [provider]  magnesium citrate SOLN Take 1 Bottle by mouth once as needed for severe constipation.   Yes [provider]  Melatonin 1  MG TABS Take 1 mg by mouth at bedtime as needed (insomnia).    Yes [provider]  Memantine HCl-Donepezil HCl (NAMZARIC) 28-10 MG CP24 Take 1 capsule by mouth daily.   Yes [provider]  metFORMIN (GLUCOPHAGE) 500 MG tablet Take 500 mg by mouth daily with breakfast.    Yes [provider]  metoprolol succinate (TOPROL-XL) 25 MG 24 hr tablet Take 25 mg by mouth daily. Take with or immediately following a meal.    Yes [provider]  mirabegron ER (MYRBETRIQ) 50 MG TB24 tablet Take 50 mg by mouth daily.    Yes [provider]  polyethylene glycol (MIRALAX / GLYCOLAX) 17 g packet Take 17 g by mouth daily as needed for moderate constipation.   Yes [provider]  Sennosides (EX-LAX) 15 MG CHEW Chew 1 tablet by mouth 2 (two) times daily as needed (constipation).   Yes [provider]  sennosides-docusate sodium (SENOKOT-S) 8.6-50 MG tablet Take 1 tablet by mouth every morning.   Yes [provider]  solifenacin (VESICARE) 10 MG tablet Take 10 mg by mouth daily.    Yes [provider]  traZODone (DESYREL) 100 MG tablet Take 100 mg by mouth at bedtime. 06/14/19  Yes [provider]  Blood Glucose Monitoring Suppl (ONETOUCH VERIO) w/Device KIT  12/31/18   [provider]  Lancets (ONETOUCH DELICA PLUS [id number]) [place]  12/31/18   [provider]  Roma Schanz test strip  12/31/18   [provider]     Critical care time: 69 min   Erick Colace ACNP-BC [place] Pager # [phone] OR # [phone] if no answer

## 2019-08-22 NOTE — ED Notes (Signed)
Pt is resistant to RT, propofol increased, pt still resisting. Neurologist at bedside.

## 2019-08-23 ENCOUNTER — Inpatient Hospital Stay (HOSPITAL_COMMUNITY): Payer: Medicare Other

## 2019-08-23 DIAGNOSIS — E1159 Type 2 diabetes mellitus with other circulatory complications: Secondary | ICD-10-CM

## 2019-08-23 DIAGNOSIS — F0391 Unspecified dementia with behavioral disturbance: Secondary | ICD-10-CM

## 2019-08-23 DIAGNOSIS — I10 Essential (primary) hypertension: Secondary | ICD-10-CM

## 2019-08-23 DIAGNOSIS — R569 Unspecified convulsions: Secondary | ICD-10-CM

## 2019-08-23 DIAGNOSIS — G9341 Metabolic encephalopathy: Secondary | ICD-10-CM

## 2019-08-23 LAB — PHOSPHORUS: Phosphorus: 3.3 mg/dL (ref 2.5–4.6)

## 2019-08-23 LAB — BASIC METABOLIC PANEL
Anion gap: 13 (ref 5–15)
BUN: 22 mg/dL (ref 8–23)
CO2: 24 mmol/L (ref 22–32)
Calcium: 9 mg/dL (ref 8.9–10.3)
Chloride: 105 mmol/L (ref 98–111)
Creatinine, Ser: 1.21 mg/dL (ref 0.61–1.24)
GFR calc Af Amer: >60 mL/min (ref >60)
GFR calc non Af Amer: 56 mL/min — ABNORMAL LOW (ref >60)
Glucose, Bld: 86 mg/dL (ref 70–99)
Potassium: 3.9 mmol/L (ref 3.5–5.1)
Sodium: 142 mmol/L (ref 135–145)

## 2019-08-23 LAB — GLUCOSE, CAPILLARY
Glucose-Capillary: 104 mg/dL — ABNORMAL HIGH (ref 70–99)
Glucose-Capillary: 108 mg/dL — ABNORMAL HIGH (ref 70–99)
Glucose-Capillary: 112 mg/dL — ABNORMAL HIGH (ref 70–99)
Glucose-Capillary: 128 mg/dL — ABNORMAL HIGH (ref 70–99)
Glucose-Capillary: 131 mg/dL — ABNORMAL HIGH (ref 70–99)
Glucose-Capillary: 66 mg/dL — ABNORMAL LOW (ref 70–99)
Glucose-Capillary: 71 mg/dL (ref 70–99)
Glucose-Capillary: 84 mg/dL (ref 70–99)

## 2019-08-23 LAB — URINE CULTURE: Culture: NO GROWTH

## 2019-08-23 LAB — CBC
HCT: 34.7 % — ABNORMAL LOW (ref 39.0–52.0)
Hemoglobin: 11.3 g/dL — ABNORMAL LOW (ref 13.0–17.0)
MCH: 31 pg (ref 26.0–34.0)
MCHC: 32.6 g/dL (ref 30.0–36.0)
MCV: 95.3 fL (ref 80.0–100.0)
Platelets: 249 10*3/uL (ref 150–400)
RBC: 3.64 MIL/uL — ABNORMAL LOW (ref 4.22–5.81)
RDW: 11.9 % (ref 11.5–15.5)
WBC: 12.2 10*3/uL — ABNORMAL HIGH (ref 4.0–10.5)
nRBC: 0 % (ref 0.0–0.2)

## 2019-08-23 LAB — MAGNESIUM: Magnesium: 1.2 mg/dL — ABNORMAL LOW (ref 1.7–2.4)

## 2019-08-23 LAB — TRIGLYCERIDES: Triglycerides: 97 mg/dL

## 2019-08-23 MED ORDER — METOPROLOL TARTRATE 5 MG/5ML IV SOLN
5.0000 mg | Freq: Four times a day (QID) | INTRAVENOUS | Status: DC
  Administered 2019-08-23 – 2019-08-24 (×3): 5 mg via INTRAVENOUS
  Filled 2019-08-23 (×4): qty 5

## 2019-08-23 MED ORDER — LORAZEPAM 2 MG/ML IJ SOLN: 2.0000 mg | Freq: Once | INTRAMUSCULAR | Status: DC

## 2019-08-23 MED ORDER — MAGNESIUM SULFATE 2 GM/50ML IV SOLN
2.0000 g | INTRAVENOUS | Status: AC
  Administered 2019-08-23 (×2): 2 g via INTRAVENOUS
  Filled 2019-08-23: qty 50

## 2019-08-23 MED ORDER — GLUCOSE 40 % PO GEL
ORAL | Status: AC
  Filled 2019-08-23: qty 1

## 2019-08-23 MED ORDER — NAPHAZOLINE-GLYCERIN 0.012-0.2 % OP SOLN
1.0000 [drp] | OPHTHALMIC | Status: DC | PRN
  Administered 2019-08-23 – 2019-08-24 (×2): 1 [drp] via OPHTHALMIC
  Filled 2019-08-23 (×2): qty 15

## 2019-08-23 MED ORDER — CHLORHEXIDINE GLUCONATE CLOTH 2 % EX PADS
6.0000 | MEDICATED_PAD | Freq: Every day | CUTANEOUS | Status: DC
  Administered 2019-08-23 – 2019-08-24 (×2): 6 via TOPICAL

## 2019-08-23 MED ORDER — DEXTROSE 50 % IV SOLN
INTRAVENOUS | Status: AC
  Administered 2019-08-23: 13:00:00 50 mL
  Filled 2019-08-23: qty 50

## 2019-08-23 MED ORDER — LORAZEPAM BOLUS VIA INFUSION: 2.0000 mg | Freq: Once | INTRAVENOUS | Status: DC

## 2019-08-23 NOTE — Progress Notes (Signed)
NAME:  Willie [name], MRN:  [id number], DOB:  1940/12/19, LOS: 1 ADMISSION DATE:  08/22/2019, CONSULTATION DATE:  08/22/2010 REFERRING MD:  EDP, CHIEF COMPLAINT:  Altered mental status  Brief History   79 year old male SNF resident with hx prostate cancer s/p radiation in 05/2019, CVA and ?dementia who presented with concern for seizure and unresponsiveness at his SNF. In the ED patient was intubated for airway protection. CTH was negative, labs overall unremarkable with normal glucose. He has mild AKI with BUN/Cr 38/1.44 (previously 1.08 in 04/2019). Lactic acid mildly up at 3.5 on admission. Was following commands and immediately able to be extubated after MRI. Impulsive and agitated. Neuro working up for seizures with EEG.  Consults:  Neurology  Procedures:  EEG 3/13 pending  Significant Diagnostic Tests:  MRI Brain 3/12 No acute infarction, hemorrhage, or mass. Chronic microvascular ischemic changes.  Micro Data:    Antimicrobials:     Interim history/subjective:  No overnight issues. Extubated without issue. Hungry. Having belly pain. Awaiting SLP eval.  Objective   Blood pressure 132/62, pulse 83, temperature 98 F (36.7 C), temperature source Oral, resp. rate 11, height 5\' 11"  (1.803 m), weight 64.2 kg, SpO2 100 %.    Vent Mode: PRVC FiO2 (%):  [40 %] 40 % Set Rate:  [18 bmp] 18 bmp Vt Set:  [600 mL] 600 mL PEEP:  [5 cmH20] 5 cmH20 Plateau Pressure:  [13 cmH20] 13 cmH20   Intake/Output Summary (Last 24 hours) at 08/23/2019 0943 Last data filed at 08/23/2019 0700 Gross per 24 hour  Intake 1351.44 ml  Output 650 ml  Net 701.44 ml   Filed Weights   08/22/19 0915 08/22/19 1200  Weight: 68 kg 64.2 kg    Examination: General: elderly man, no distress HENT: dry mucus membranes Lungs: lungs are clear Cardiovascular: tachycardic, regular Abdomen: scaphoid soft, hypoactive bowel sounds Extremities: thin, no edema Neuro: awake, following commands, imuplsive and  delirious MSK: no rashes  Resolved Hospital Problem list   Respiratory failure  Assessment & Plan:   79 yo man with h/o prostate cancer, CVA and dementia. Presented from SNF with concern for seizures. Intubated and then immediately extubated  Seizure-like activity  - EEG pending. Neuro following. Has prn ativan. No witnessed seizures here. - on keppra and phenytoin and phosphenytoin per neuro   Acute hypoxemic respiratory insufficiency secondary to AMS  - Mental status improved.  Extubated without issue  Hypothyroidism - continue synthroid  AKI - improved - on low dose MIVF. Would d/c once tolerating PO  Lactic acidosis - improved  Type 2 DM - holding metformin. On SSI  HTN Holding home metropolol po Will schedule IV  Best practice:  Diet: NPO awaiting SLP eval Pain/Anxiety/Delirium protocol (if indicated): prn ativan for seizures DVT prophylaxis: heparin [place] GI prophylaxis: n/a. On home PPI Glucose control: controlled Foley none Mobility: will need PT/OT after EEG.  Code Status: Full Disposition: ok for transfer to med surg with tele. TRH to assume care 3/14. PCCM to sign off.   Lenice Llamas, MD Pulmonary and [place] Pager: [place]   CBC: Recent Labs  Lab 08/22/19 0726 08/22/19 0746 08/22/19 1105 08/23/19 0410  WBC 4.6  --  7.1 12.2*  NEUTROABS 2.2  --   --   --   HGB 12.0* 11.9* 11.4* 11.3*  HCT 37.0* 35.0* 34.5* 34.7*  MCV 95.9  --  95.6 95.3  PLT 237  --  239  0000000    Basic Metabolic Panel: Recent Labs  Lab 08/22/19 0726 08/22/19 0746 08/22/19 1105 08/23/19 0410  NA 141 143  --  142  K 4.1 4.0  --  3.9  CL 105  --   --  105  CO2 25  --   --  24  GLUCOSE 140*  --   --  86  BUN 38*  --   --  22  CREATININE 1.44*  --  1.49* 1.21  CALCIUM 9.6  --   --  9.0  MG  --   --   --  1.2*  PHOS  --   --   --  3.3   GFR: Estimated Creatinine Clearance: 44.2 mL/min (by C-G  formula based on SCr of 1.21 mg/dL). Recent Labs  Lab 08/22/19 0726 08/22/19 1055 08/22/19 1105 08/22/19 1301 08/23/19 0410  WBC 4.6  --  7.1  --  12.2*  LATICACIDVEN 1.3 3.5*  --  2.1*  --     Liver Function Tests: Recent Labs  Lab 08/22/19 0726  AST 17  ALT 12  ALKPHOS 52  BILITOT 0.7  PROT 6.5  ALBUMIN 3.4*   No results for input(s): LIPASE, AMYLASE in the last 168 hours. Recent Labs  Lab 08/22/19 0726  AMMONIA 31    ABG    Component Value Date/Time   HCO3 27.9 08/22/2019 0746   TCO2 29 08/22/2019 0746   O2SAT 87.0 08/22/2019 0746     Coagulation Profile: No results for input(s): INR, PROTIME in the last 168 hours.  Cardiac Enzymes: No results for input(s): CKTOTAL, CKMB, CKMBINDEX, TROPONINI in the last 168 hours.  HbA1C: Hgb A1c MFr Bld  Date/Time Value Ref Range Status  08/22/2019 09:47 AM 5.9 (H) 4.8 - 5.6 % Final    Comment:    (NOTE) Pre diabetes:          5.7%-6.4% Diabetes:              >6.4% Glycemic control for   <7.0% adults with diabetes   04/18/2019 09:30 AM 6.3 (H) 4.8 - 5.6 % Final    Comment:    (NOTE) Pre diabetes:          5.7%-6.4% Diabetes:              >6.4% Glycemic control for   <7.0% adults with diabetes     CBG: Recent Labs  Lab 08/22/19 1618 08/22/19 2037 08/23/19 0044 08/23/19 0509 08/23/19 0807  GLUCAP 79 87 108* 84 71

## 2019-08-23 NOTE — Evaluation (Signed)
Clinical/Bedside Swallow Evaluation Patient Details  Name: Willie [name] MRN: [id number] Date of Birth: Jun 28, 1940  Today's Date: 08/23/2019 Time: SLP Start Time (ACUTE ONLY): 1336 SLP Stop Time (ACUTE ONLY): 1404 SLP Time Calculation (min) (ACUTE ONLY): 28 min  Past Medical History:  Past Medical History:  Diagnosis Date  . Alcohol abuse   . Benign prostatic hypertrophy   . Dementia [facility])    daughter-in-law does not know of this   . Diabetes mellitus   . Encephalopathy   . GERD (gastroesophageal reflux disease)   . History of tarsorrhaphy    right eye  . Hypertension   . Hypothyroidism   . Pneumonia   . Prostate cancer ([place])   . Stroke [facility])    right eye does not shut all the way-tarsorrhaphy   Past Surgical History:  Past Surgical History:  Procedure Laterality Date  . COLONOSCOPY WITH PROPOFOL N/A 08/24/2014   Procedure: COLONOSCOPY WITH PROPOFOL;  Surgeon: Garlan Fair, MD;  [facility];  Service: Endoscopy;  Laterality: N/A;  . EYE SURGERY     Laser eye surgery  . GOLD SEED IMPLANT N/A 04/18/2019   Procedure: GOLD SEED IMPLANT;  Surgeon: Kathie Rhodes, MD;  Location: [facility];  Service: Urology;  Laterality: N/A;  . PROSTATE BIOPSY    . SPACE OAR INSTILLATION N/A 04/18/2019   Procedure: SPACE OAR INSTILLATION;  Surgeon: Kathie Rhodes, MD;  Location[facility];  Service: Urology;  Laterality: N/A;   HPI:  79 year old male SNF resident with hx prostate cancer s/p radiation in 05/2019, CVA and ?dementia who presented with concern for seizure and unresponsiveness at his SNF, intubated in ED but extubated same day.    Assessment / Plan / Recommendation Clinical Impression  Evaluation limited due to patient's level of cooperation/dementia. Patient very distracted by wrist restraints and despite requesting pos all day today, refusing initially. Once restraints removed and patient able to self feed, patient agreeable to consuming pos. Oral phase prolonged and with  intermittent anterior labial spillage (likely baseline due to CVA) but functional. Intermittent throat clearing noted with both solids and thin liquids which patient's daughter in law reports is baseline. She also reports a h/o dysphagia s/p CVA, previously on thickened liquids but switched to thin per own discretion approximately three years ago and has been without acute respiratory/pulmonary complications since. Unclear if throat clearing is habitual or a result of penetration/aspiration due to baseline dysphagia. In general, po intake limited this evaluation and suspect that patient's cooperation with po intake will fluctuate with mental status. In light of this, recommend initiation of dysphagia 3 solids, thin liquids with close monitoring from family members and nursing staff for increased signs of aspiration. SLP will also f/u for toleration and need for additional testing or diet modifications.    SLP Visit Diagnosis: Dysphagia, oropharyngeal phase (R13.12)    Aspiration Risk  Moderate aspiration risk    Diet Recommendation Dysphagia 3 (Mech soft);Thin liquid   Liquid Administration via: Cup;Straw Medication Administration: Whole meds with liquid(as tolerated) Supervision: Patient able to self feed;Full supervision/cueing for compensatory strategies Compensations: Slow rate;Small sips/bites;Monitor for anterior loss Postural Changes: Seated upright at 90 degrees    Other  Recommendations Oral Care Recommendations: Oral care BID   Follow up Recommendations (TBD)      Frequency and Duration min 2x/week  2 weeks       Prognosis Barriers to Reach Goals: Cognitive deficits      Swallow Study   General HPI:  79 year old male SNF resident with hx prostate cancer s/p radiation in 05/2019, CVA and ?dementia who presented with concern for seizure and unresponsiveness at his SNF, intubated in ED but extubated same day.  Type of Study: Bedside Swallow Evaluation Previous Swallow  Assessment: none noted in chart Diet Prior to this Study: NPO Temperature Spikes Noted: No Respiratory Status: Room air History of Recent Intubation: Yes Length of Intubations (days): (less than 24 hours) Date extubated: 08/22/19 Behavior/Cognition: Alert;Uncooperative;Distractible;Requires cueing Oral Cavity Assessment: Within Functional Limits Oral Care Completed by SLP: No Oral Cavity - Dentition: Dentures, top;Dentures, bottom Vision: Functional for self-feeding Self-Feeding Abilities: Able to feed self Patient Positioning: Upright in bed Baseline Vocal Quality: Normal Volitional Cough: Cognitively unable to elicit Volitional Swallow: Unable to elicit    Oral/Motor/Sensory Function Overall Oral Motor/Sensory Function: Moderate impairment(from previous CVA) Facial ROM: Reduced right Facial Symmetry: Abnormal symmetry right Facial Strength: Reduced right   Ice Chips Ice chips: Not tested   Thin Liquid Thin Liquid: Impaired Presentation: Straw;Self Fed Pharyngeal  Phase Impairments: Throat Clearing - Immediate    Nectar Thick Nectar Thick Liquid: Not tested   Honey Thick Honey Thick Liquid: Not tested   Puree Puree: Not tested   Solid     Solid: Impaired Presentation: Self Fed;Spoon Oral Phase Impairments: Reduced labial seal;Impaired mastication Oral Phase Functional Implications: Right anterior spillage;Left anterior spillage;Impaired mastication;Prolonged oral transit Pharyngeal Phase Impairments: Throat Clearing - Delayed     Tannah Dreyfuss MA, CCC-SLP   Yalissa Fink [place] 08/23/2019,4:13 PM

## 2019-08-23 NOTE — Progress Notes (Signed)
LTM EEG hooked up and running - possible initial skin breakdown pt head already had some mild redness- push button tested - neuro notified. PT was a difficulty hook up and had to be restrained.

## 2019-08-24 LAB — CBC WITH DIFFERENTIAL/PLATELET
Abs Immature Granulocytes: 0.03 10*3/uL (ref 0.00–0.07)
Basophils Absolute: 0 10*3/uL (ref 0.0–0.1)
Basophils Relative: 0 %
Eosinophils Absolute: 0 10*3/uL (ref 0.0–0.5)
Eosinophils Relative: 0 %
HCT: 32.4 % — ABNORMAL LOW (ref 39.0–52.0)
Hemoglobin: 10.7 g/dL — ABNORMAL LOW (ref 13.0–17.0)
Immature Granulocytes: 0 %
Lymphocytes Relative: 21 %
Lymphs Abs: 2.3 10*3/uL (ref 0.7–4.0)
MCH: 30.6 pg (ref 26.0–34.0)
MCHC: 33 g/dL (ref 30.0–36.0)
MCV: 92.6 fL (ref 80.0–100.0)
Monocytes Absolute: 0.6 10*3/uL (ref 0.1–1.0)
Monocytes Relative: 5 %
Neutro Abs: 8.1 10*3/uL — ABNORMAL HIGH (ref 1.7–7.7)
Neutrophils Relative %: 74 %
Platelets: 215 10*3/uL (ref 150–400)
RBC: 3.5 MIL/uL — ABNORMAL LOW (ref 4.22–5.81)
RDW: 11.9 % (ref 11.5–15.5)
WBC: 11 10*3/uL — ABNORMAL HIGH (ref 4.0–10.5)
nRBC: 0 % (ref 0.0–0.2)

## 2019-08-24 LAB — GLUCOSE, CAPILLARY
Glucose-Capillary: 84 mg/dL (ref 70–99)
Glucose-Capillary: 88 mg/dL (ref 70–99)
Glucose-Capillary: 80 mg/dL (ref 70–99)
Glucose-Capillary: 129 mg/dL — ABNORMAL HIGH (ref 70–99)
Glucose-Capillary: 102 mg/dL — ABNORMAL HIGH (ref 70–99)

## 2019-08-24 LAB — COMPREHENSIVE METABOLIC PANEL
ALT: 10 U/L (ref 0–44)
AST: 16 U/L (ref 15–41)
Albumin: 2.9 g/dL — ABNORMAL LOW (ref 3.5–5.0)
Alkaline Phosphatase: 45 U/L (ref 38–126)
Anion gap: 14 (ref 5–15)
BUN: 16 mg/dL (ref 8–23)
CO2: 22 mmol/L (ref 22–32)
Calcium: 8.5 mg/dL — ABNORMAL LOW (ref 8.9–10.3)
Chloride: 106 mmol/L (ref 98–111)
Creatinine, Ser: 1.16 mg/dL (ref 0.61–1.24)
GFR calc Af Amer: >60 mL/min (ref >60)
GFR calc non Af Amer: 59 mL/min — ABNORMAL LOW (ref >60)
Glucose, Bld: 93 mg/dL (ref 70–99)
Potassium: 4 mmol/L (ref 3.5–5.1)
Sodium: 142 mmol/L (ref 135–145)
Total Bilirubin: 1 mg/dL (ref 0.3–1.2)
Total Protein: 6 g/dL — ABNORMAL LOW (ref 6.5–8.1)

## 2019-08-24 LAB — MAGNESIUM: Magnesium: 1.8 mg/dL (ref 1.7–2.4)

## 2019-08-24 LAB — TRIGLYCERIDES: Triglycerides: 73 mg/dL (ref <150)

## 2019-08-24 MED ORDER — ORAL CARE MOUTH RINSE
15.0000 mL | Freq: Two times a day (BID) | OROMUCOSAL | Status: DC
  Administered 2019-08-24 – 2019-08-27 (×7): 15 mL via OROMUCOSAL

## 2019-08-24 MED ORDER — LORAZEPAM 2 MG/ML IJ SOLN
0.5000 mg | Freq: Once | INTRAMUSCULAR | Status: AC
  Administered 2019-08-24: 22:00:00 0.5 mg via INTRAVENOUS
  Filled 2019-08-24: qty 1

## 2019-08-24 MED ORDER — LEVETIRACETAM 250 MG PO TABS
250.0000 mg | ORAL_TABLET | Freq: Two times a day (BID) | ORAL | Status: DC
  Administered 2019-08-24 – 2019-08-27 (×6): 250 mg via ORAL
  Filled 2019-08-24 (×6): qty 1

## 2019-08-24 MED ORDER — BACLOFEN 10 MG PO TABS
10.0000 mg | ORAL_TABLET | Freq: Three times a day (TID) | ORAL | Status: DC
  Administered 2019-08-24 – 2019-08-27 (×10): 10 mg via ORAL
  Filled 2019-08-24 (×12): qty 1

## 2019-08-24 MED ORDER — METOPROLOL SUCCINATE ER 25 MG PO TB24
12.5000 mg | ORAL_TABLET | Freq: Every day | ORAL | Status: DC
  Administered 2019-08-24 – 2019-08-27 (×4): 12.5 mg via ORAL
  Filled 2019-08-24 (×4): qty 1

## 2019-08-24 MED ORDER — POLYETHYLENE GLYCOL 3350 17 G PO PACK
17.0000 g | PACK | Freq: Every day | ORAL | Status: DC | PRN
  Filled 2019-08-24: qty 1

## 2019-08-24 MED ORDER — DARIFENACIN HYDROBROMIDE ER 7.5 MG PO TB24
7.5000 mg | ORAL_TABLET | Freq: Every day | ORAL | Status: DC
  Administered 2019-08-24 – 2019-08-27 (×4): 7.5 mg via ORAL
  Filled 2019-08-24 (×4): qty 1

## 2019-08-24 MED ORDER — GLUCERNA SHAKE PO LIQD
237.0000 mL | Freq: Two times a day (BID) | ORAL | Status: DC
  Administered 2019-08-24 – 2019-08-27 (×5): 237 mL via ORAL

## 2019-08-24 NOTE — Progress Notes (Addendum)
Subjective: Patient asleep, alert, oriented, easily arouses to name. Extubated. Has exhibited impulsive behavior. Per RN patient stated that he doesn't like the food and did not want to eat at his assisted living place anymore. EEG captured multiple events without EEG change. Most likely psychogenic (please see EEG report).  Objective: Current vital signs: BP (!) 118/57   Pulse 94   Temp 98.7 F (37.1 C) (Oral)   Resp 12   Ht '5\' 11"'  (1.803 m)   Wt 66.8 kg   SpO2 98%   BMI 20.54 kg/m  Vital signs in last 24 hours: Temp:  [98 F (36.7 C)-99.2 F (37.3 C)] 98.7 F (37.1 C) (03/14 0441) Pulse Rate:  [83-134] 94 (03/13 1900) Resp:  [9-26] 12 (03/13 1900) BP: (100-159)/(55-137) 118/57 (03/13 1900) SpO2:  [97 %-100 %] 98 % (03/13 1900) Weight:  [66.8 kg] 66.8 kg (03/14 0500)  Intake/Output from previous day: 03/13 0701 - 03/14 0700 In: 564.3 [I.V.:364.3; IV Piggyback:200] Out: 1075 [Urine:1075] Intake/Output this shift: No intake/output data recorded. Nutritional status:  Diet Order            DIET DYS 3 Room service appropriate? Yes; Fluid consistency: Thin  Diet effective now              Neurologic Exam: Ment: Alert, oriented to name/age/month/year, able to follow commands. No tensing up or shaking jerking episodes at all during today's exam. CN: PERRL, EOMI. Tarsorrhaphy of right eye lid is noted. Tongue protrudes midline, palate elevates symmetrically. Hearing intact to voice. Shoulder shrug equal. Facial light touch sensation equal. Slight left facial droop noted. Motor: Able to raise all 4 extremities anti gravity. 5/5 strength x 4. Sensory: Intact to FT throughout DTR: 2+ and symmetric Cerebellar: No ataxia noted  Lab Results: Results for orders placed or performed during the hospital encounter of 08/22/19 (from the past 48 hour(s))  CBG monitoring, ED     Status: Abnormal   Collection Time: 08/22/19  7:12 AM  Result Value Ref Range   Glucose-Capillary 69 (L)  70 - 99 mg/dL    Comment: Glucose reference range applies only to samples taken after fasting for at least 8 hours.  Comprehensive metabolic panel     Status: Abnormal   Collection Time: 08/22/19  7:26 AM  Result Value Ref Range   Sodium 141 135 - 145 mmol/L   Potassium 4.1 3.5 - 5.1 mmol/L   Chloride 105 98 - 111 mmol/L   CO2 25 22 - 32 mmol/L   Glucose, Bld 140 (H) 70 - 99 mg/dL    Comment: Glucose reference range applies only to samples taken after fasting for at least 8 hours.   BUN 38 (H) 8 - 23 mg/dL   Creatinine, Ser 1.44 (H) 0.61 - 1.24 mg/dL   Calcium 9.6 8.9 - 10.3 mg/dL   Total Protein 6.5 6.5 - 8.1 g/dL   Albumin 3.4 (L) 3.5 - 5.0 g/dL   AST 17 15 - 41 U/L   ALT 12 0 - 44 U/L   Alkaline Phosphatase 52 38 - 126 U/L   Total Bilirubin 0.7 0.3 - 1.2 mg/dL   GFR calc non Af Amer 46 (L) >60 mL/min   GFR calc Af Amer 53 (L) >60 mL/min   Anion gap 11 5 - 15    Comment: Performed at [facility] [address] [place], [place] [address]  CBC WITH DIFFERENTIAL     Status: Abnormal   Collection Time: 08/22/19  7:26  AM  Result Value Ref Range   WBC 4.6 4.0 - 10.5 K/uL   RBC 3.86 (L) 4.22 - 5.81 MIL/uL   Hemoglobin 12.0 (L) 13.0 - 17.0 g/dL   HCT 37.0 (L) 39.0 - 52.0 %   MCV 95.9 80.0 - 100.0 fL   MCH 31.1 26.0 - 34.0 pg   MCHC 32.4 30.0 - 36.0 g/dL   RDW 11.8 11.5 - 15.5 %   Platelets 237 150 - 400 K/uL   nRBC 0.0 0.0 - 0.2 %   Neutrophils Relative % 48 %   Neutro Abs 2.2 1.7 - 7.7 K/uL   Lymphocytes Relative 45 %   Lymphs Abs 2.1 0.7 - 4.0 K/uL   Monocytes Relative 5 %   Monocytes Absolute 0.2 0.1 - 1.0 K/uL   Eosinophils Relative 1 %   Eosinophils Absolute 0.0 0.0 - 0.5 K/uL   Basophils Relative 1 %   Basophils Absolute 0.0 0.0 - 0.1 K/uL   Immature Granulocytes 0 %   Abs Immature Granulocytes 0.01 0.00 - 0.07 K/uL    Comment: Performed at [facility], [address], [place], [place] [address]  Lactic acid, plasma     Status: None   Collection  Time: 08/22/19  7:26 AM  Result Value Ref Range   Lactic Acid, Venous 1.3 0.5 - 1.9 mmol/L    Comment: Performed at [facility] [address] [place], [place] [address]  Ammonia     Status: None   Collection Time: 08/22/19  7:26 AM  Result Value Ref Range   Ammonia 31 9 - 35 umol/L    Comment: Performed at [facility], [place] [address] [place], [place] [address]  Blood Cultures (routine x 2)     Status: None (Preliminary result)   Collection Time: 08/22/19  7:35 AM   Specimen: BLOOD  Result Value Ref Range   Specimen Description BLOOD LEFT ANTECUBITAL    Special Requests      BOTTLES DRAWN AEROBIC AND ANAEROBIC Blood Culture adequate volume   Culture      NO GROWTH 1 DAY Performed at [facility], [place] [address], [place], [place] [address]    Report Status PENDING   Respiratory Panel by RT PCR (Flu A&B, Covid) - Nasopharyngeal Swab     Status: None   Collection Time: 08/22/19  7:41 AM   Specimen: Nasopharyngeal Swab  Result Value Ref Range   SARS Coronavirus 2 by RT PCR NEGATIVE NEGATIVE    Comment: (NOTE) SARS-CoV-2 target nucleic acids are NOT DETECTED. The SARS-CoV-2 RNA is generally detectable in upper respiratoy specimens during the acute phase of infection. The lowest concentration of SARS-CoV-2 viral copies this assay can detect is 131 copies/mL. A negative result does not preclude SARS-Cov-2 infection and should not be used as the sole basis for treatment or other patient management decisions. A negative result may occur with  improper specimen collection/handling, submission of specimen other than nasopharyngeal swab, presence of viral mutation(s) within the areas targeted by this assay, and inadequate number of viral copies (<131 copies/mL). A negative result must be combined with clinical observations, patient history, and epidemiological information. The expected result is Negative. Fact Sheet for Patients:   PinkCheek.be Fact Sheet for Healthcare Providers:  GravelBags.it This test is not yet ap proved or cleared by the [place] FDA and  has been authorized for detection and/or diagnosis of SARS-CoV-2 by FDA under an Emergency Use Authorization (EUA). This EUA  will remain  in effect (meaning this test can be used) for the duration of the COVID-19 declaration under Section 564(b)(1) of the Act, 21 U.S.C. section 360bbb-3(b)(1), unless the authorization is terminated or revoked sooner.    Influenza A by PCR NEGATIVE NEGATIVE   Influenza B by PCR NEGATIVE NEGATIVE    Comment: (NOTE) The Xpert Xpress SARS-CoV-2/FLU/RSV assay is intended as an aid in  the diagnosis of influenza from Nasopharyngeal swab specimens and  should not be used as a sole basis for treatment. Nasal washings and  aspirates are unacceptable for Xpert Xpress SARS-CoV-2/FLU/RSV  testing. Fact Sheet for Patients: PinkCheek.be Fact Sheet for Healthcare Providers: GravelBags.it This test is not yet approved or cleared by the [place] FDA and  has been authorized for detection and/or diagnosis of SARS-CoV-2 by  FDA under an Emergency Use Authorization (EUA). This EUA will remain  in effect (meaning this test can be used) for the duration of the  Covid-19 declaration under Section 564(b)(1) of the Act, 21  U.S.C. section 360bbb-3(b)(1), unless the authorization is  terminated or revoked. Performed at [facility], [place] [address], [place], [place] [address]   POCT I-Stat EG7     Status: Abnormal   Collection Time: 08/22/19  7:46 AM  Result Value Ref Range   pH, Ven 7.398 7.250 - 7.430   pCO2, Ven 45.3 44.0 - 60.0 mmHg   pO2, Ven 53.0 (H) 32.0 - 45.0 mmHg   Bicarbonate 27.9 20.0 - 28.0 mmol/L   TCO2 29 22 - 32 mmol/L   O2 Saturation 87.0 %   Acid-Base Excess 3.0 (H) 0.0 - 2.0 mmol/L    Sodium 143 135 - 145 mmol/L   Potassium 4.0 3.5 - 5.1 mmol/L   Calcium, Ion 1.25 1.15 - 1.40 mmol/L   HCT 35.0 (L) 39.0 - 52.0 %   Hemoglobin 11.9 (L) 13.0 - 17.0 g/dL   Patient temperature HIDE    Sample type VENOUS   Blood Cultures (routine x 2)     Status: None (Preliminary result)   Collection Time: 08/22/19  7:52 AM   Specimen: BLOOD LEFT FOREARM  Result Value Ref Range   Specimen Description BLOOD LEFT FOREARM    Special Requests      BOTTLES DRAWN AEROBIC AND ANAEROBIC Blood Culture results may not be optimal due to an inadequate volume of blood received in culture bottles   Culture      NO GROWTH 1 DAY Performed at [facility] [address], [place], [place] [address]    Report Status PENDING   Hemoglobin A1c     Status: Abnormal   Collection Time: 08/22/19  9:47 AM  Result Value Ref Range   Hgb A1c MFr Bld 5.9 (H) 4.8 - 5.6 %    Comment: (NOTE) Pre diabetes:          5.7%-6.4% Diabetes:              >6.4% Glycemic control for   <7.0% adults with diabetes    Mean Plasma Glucose 122.63 mg/dL    Comment: Performed at [facility] [address] [place], [place] [address]  CBG monitoring, ED     Status: None   Collection Time: 08/22/19  9:51 AM  Result Value Ref Range   Glucose-Capillary 99 70 - 99 mg/dL    Comment: Glucose reference range applies only to samples taken after fasting for at least 8 hours.  Lactic acid, plasma  Status: Abnormal   Collection Time: 08/22/19 10:55 AM  Result Value Ref Range   Lactic Acid, Venous 3.5 (HH) 0.5 - 1.9 mmol/L    Comment: CRITICAL RESULT CALLED TO, READ BACK BY AND VERIFIED WITH: PAIGE PULLIAM,RN AT 1140 08/22/2019 BY ZBEECH. Performed at [facility], [place] [address], [place], [place] [address]   Urinalysis, Routine w reflex microscopic     Status: Abnormal   Collection Time: 08/22/19 11:02 AM  Result Value Ref Range   Color, Urine YELLOW YELLOW   APPearance CLEAR CLEAR   Specific Gravity, Urine  1.014 1.005 - 1.030   pH 8.0 5.0 - 8.0   Glucose, UA 50 (A) NEGATIVE mg/dL   Hgb urine dipstick NEGATIVE NEGATIVE   Bilirubin Urine NEGATIVE NEGATIVE   Ketones, ur 20 (A) NEGATIVE mg/dL   Protein, ur NEGATIVE NEGATIVE mg/dL   Nitrite NEGATIVE NEGATIVE   Leukocytes,Ua NEGATIVE NEGATIVE    Comment: Performed at [facility] [address], [place], [place] [address]  Urine culture     Status: None   Collection Time: 08/22/19 11:02 AM   Specimen: Urine, Catheterized  Result Value Ref Range   Specimen Description URINE, CATHETERIZED    Special Requests NONE    Culture      NO GROWTH Performed at [facility], [place] [address], [place], [place] [address]    Report Status 08/23/2019 FINAL   CBC     Status: Abnormal   Collection Time: 08/22/19 11:05 AM  Result Value Ref Range   WBC 7.1 4.0 - 10.5 K/uL   RBC 3.61 (L) 4.22 - 5.81 MIL/uL   Hemoglobin 11.4 (L) 13.0 - 17.0 g/dL   HCT 34.5 (L) 39.0 - 52.0 %   MCV 95.6 80.0 - 100.0 fL   MCH 31.6 26.0 - 34.0 pg   MCHC 33.0 30.0 - 36.0 g/dL   RDW 11.7 11.5 - 15.5 %   Platelets 239 150 - 400 K/uL   nRBC 0.0 0.0 - 0.2 %    Comment: Performed at [facility], [place] [address], [place], [place] [address]  Creatinine, serum     Status: Abnormal   Collection Time: 08/22/19 11:05 AM  Result Value Ref Range   Creatinine, Ser 1.49 (H) 0.61 - 1.24 mg/dL   GFR calc non Af Amer 44 (L) >60 mL/min   GFR calc Af Amer 51 (L) >60 mL/min    Comment: Performed at [facility] [address], [place], [place] [address]  MRSA PCR Screening     Status: None   Collection Time: 08/22/19 12:17 PM   Specimen: Nasal Mucosa; Nasopharyngeal  Result Value Ref Range   MRSA by PCR NEGATIVE NEGATIVE    Comment:        The GeneXpert MRSA Assay (FDA approved for NASAL specimens only), is one component of a comprehensive MRSA colonization surveillance program. It is not intended to diagnose MRSA infection nor to guide or monitor treatment  for MRSA infections. Performed at [facility], [place] [address], [place], [place] [address]   Glucose, capillary     Status: Abnormal   Collection Time: 08/22/19 12:40 PM  Result Value Ref Range   Glucose-Capillary 69 (L) 70 - 99 mg/dL    Comment: Glucose reference range applies only to samples taken after fasting for at least 8 hours.  Lactic acid, plasma     Status: Abnormal   Collection Time: 08/22/19  1:01 PM  Result Value Ref Range   Lactic Acid, Venous 2.1 (HH) 0.5 - 1.9 mmol/L    Comment: CRITICAL VALUE NOTED.  VALUE IS CONSISTENT WITH PREVIOUSLY REPORTED AND CALLED VALUE. Performed at [facility], [place] [address], [place], [place] [address]   Glucose, capillary     Status: Abnormal   Collection Time: 08/22/19  1:06 PM  Result Value Ref Range   Glucose-Capillary 145 (H) 70 - 99 mg/dL    Comment: Glucose reference range applies only to samples taken after fasting for at least 8 hours.  Glucose, capillary     Status: None   Collection Time: 08/22/19  4:18 PM  Result Value Ref Range   Glucose-Capillary 79 70 - 99 mg/dL    Comment: Glucose reference range applies only to samples taken after fasting for at least 8 hours.  Glucose, capillary     Status: None   Collection Time: 08/22/19  8:37 PM  Result Value Ref Range   Glucose-Capillary 87 70 - 99 mg/dL    Comment: Glucose reference range applies only to samples taken after fasting for at least 8 hours.  Glucose, capillary     Status: Abnormal   Collection Time: 08/23/19 12:44 AM  Result Value Ref Range   Glucose-Capillary 108 (H) 70 - 99 mg/dL    Comment: Glucose reference range applies only to samples taken after fasting for at least 8 hours.   Comment 1 Notify RN    Comment 2 Document in Chart   CBC     Status: Abnormal   Collection Time: 08/23/19  4:10 AM  Result Value Ref Range   WBC 12.2 (H) 4.0 - 10.5 K/uL   RBC 3.64 (L) 4.22 - 5.81 MIL/uL   Hemoglobin 11.3 (L) 13.0 - 17.0 g/dL   HCT 34.7 (L) 39.0 -  52.0 %   MCV 95.3 80.0 - 100.0 fL   MCH 31.0 26.0 - 34.0 pg   MCHC 32.6 30.0 - 36.0 g/dL   RDW 11.9 11.5 - 15.5 %   Platelets 249 150 - 400 K/uL   nRBC 0.0 0.0 - 0.2 %    Comment: Performed at [facility], [place] [address], [place], [place] [address]  Basic metabolic panel     Status: Abnormal   Collection Time: 08/23/19  4:10 AM  Result Value Ref Range   Sodium 142 135 - 145 mmol/L   Potassium 3.9 3.5 - 5.1 mmol/L   Chloride 105 98 - 111 mmol/L   CO2 24 22 - 32 mmol/L   Glucose, Bld 86 70 - 99 mg/dL    Comment: Glucose reference range applies only to samples taken after fasting for at least 8 hours.   BUN 22 8 - 23 mg/dL   Creatinine, Ser 1.21 0.61 - 1.24 mg/dL   Calcium 9.0 8.9 - 10.3 mg/dL   GFR calc non Af Amer 56 (L) >60 mL/min   GFR calc Af Amer >60 >60 mL/min   Anion gap 13 5 - 15    Comment: Performed at [facility] [address], [place], [place] [address]  Magnesium     Status: Abnormal   Collection Time: 08/23/19  4:10 AM  Result Value Ref Range   Magnesium 1.2 (L) 1.7 - 2.4 mg/dL    Comment: Performed at [facility] [address], [place], [place] [address]  Phosphorus     Status: None   Collection Time: 08/23/19  4:10 AM  Result Value  Ref Range   Phosphorus 3.3 2.5 - 4.6 mg/dL    Comment: Performed at [facility] [address], [place], [place] [address]  Triglycerides     Status: None   Collection Time: 08/23/19  4:10 AM  Result Value Ref Range   Triglycerides 97 <150 mg/dL    Comment: Performed at [facility] [address], [place], [place] [address]  Glucose, capillary     Status: None   Collection Time: 08/23/19  5:09 AM  Result Value Ref Range   Glucose-Capillary 84 70 - 99 mg/dL    Comment: Glucose reference range applies only to samples taken after fasting for at least 8 hours.   Comment 1 Notify RN    Comment 2 Document in Chart   Glucose, capillary     Status: None   Collection Time: 08/23/19  8:07 AM   Result Value Ref Range   Glucose-Capillary 71 70 - 99 mg/dL    Comment: Glucose reference range applies only to samples taken after fasting for at least 8 hours.  Glucose, capillary     Status: Abnormal   Collection Time: 08/23/19 12:21 PM  Result Value Ref Range   Glucose-Capillary 66 (L) 70 - 99 mg/dL    Comment: Glucose reference range applies only to samples taken after fasting for at least 8 hours.  Glucose, capillary     Status: Abnormal   Collection Time: 08/23/19  2:14 PM  Result Value Ref Range   Glucose-Capillary 128 (H) 70 - 99 mg/dL    Comment: Glucose reference range applies only to samples taken after fasting for at least 8 hours.  Glucose, capillary     Status: Abnormal   Collection Time: 08/23/19  4:03 PM  Result Value Ref Range   Glucose-Capillary 104 (H) 70 - 99 mg/dL    Comment: Glucose reference range applies only to samples taken after fasting for at least 8 hours.  Glucose, capillary     Status: Abnormal   Collection Time: 08/23/19  7:54 PM  Result Value Ref Range   Glucose-Capillary 131 (H) 70 - 99 mg/dL    Comment: Glucose reference range applies only to samples taken after fasting for at least 8 hours.  Glucose, capillary     Status: Abnormal   Collection Time: 08/23/19 11:36 PM  Result Value Ref Range   Glucose-Capillary 112 (H) 70 - 99 mg/dL    Comment: Glucose reference range applies only to samples taken after fasting for at least 8 hours.  Triglycerides     Status: None   Collection Time: 08/24/19  3:54 AM  Result Value Ref Range   Triglycerides 73 <150 mg/dL    Comment: Performed at [facility] [address], [place], [place] [address]  Glucose, capillary     Status: None   Collection Time: 08/24/19  4:41 AM  Result Value Ref Range   Glucose-Capillary 84 70 - 99 mg/dL    Comment: Glucose reference range applies only to samples taken after fasting for at least 8 hours.    Recent Results (from the past 240 hour(s))  Blood Cultures  (routine x 2)     Status: None (Preliminary result)   Collection Time: 08/22/19  7:35 AM   Specimen: BLOOD  Result Value Ref Range Status   Specimen Description BLOOD LEFT ANTECUBITAL  Final   Special Requests   Final    BOTTLES DRAWN AEROBIC AND ANAEROBIC Blood Culture adequate volume   Culture  Final    NO GROWTH 1 DAY Performed at [facility], [place] [address], [place], [place] [address]    Report Status PENDING  Incomplete  Respiratory Panel by RT PCR (Flu A&B, Covid) - Nasopharyngeal Swab     Status: None   Collection Time: 08/22/19  7:41 AM   Specimen: Nasopharyngeal Swab  Result Value Ref Range Status   SARS Coronavirus 2 by RT PCR NEGATIVE NEGATIVE Final    Comment: (NOTE) SARS-CoV-2 target nucleic acids are NOT DETECTED. The SARS-CoV-2 RNA is generally detectable in upper respiratoy specimens during the acute phase of infection. The lowest concentration of SARS-CoV-2 viral copies this assay can detect is 131 copies/mL. A negative result does not preclude SARS-Cov-2 infection and should not be used as the sole basis for treatment or other patient management decisions. A negative result may occur with  improper specimen collection/handling, submission of specimen other than nasopharyngeal swab, presence of viral mutation(s) within the areas targeted by this assay, and inadequate number of viral copies (<131 copies/mL). A negative result must be combined with clinical observations, patient history, and epidemiological information. The expected result is Negative. Fact Sheet for Patients:  PinkCheek.be Fact Sheet for Healthcare Providers:  GravelBags.it This test is not yet ap proved or cleared by the [place] FDA and  has been authorized for detection and/or diagnosis of SARS-CoV-2 by FDA under an Emergency Use Authorization (EUA). This EUA will remain  in effect (meaning this test can be used) for the  duration of the COVID-19 declaration under Section 564(b)(1) of the Act, 21 U.S.C. section 360bbb-3(b)(1), unless the authorization is terminated or revoked sooner.    Influenza A by PCR NEGATIVE NEGATIVE Final   Influenza B by PCR NEGATIVE NEGATIVE Final    Comment: (NOTE) The Xpert Xpress SARS-CoV-2/FLU/RSV assay is intended as an aid in  the diagnosis of influenza from Nasopharyngeal swab specimens and  should not be used as a sole basis for treatment. Nasal washings and  aspirates are unacceptable for Xpert Xpress SARS-CoV-2/FLU/RSV  testing. Fact Sheet for Patients: PinkCheek.be Fact Sheet for Healthcare Providers: GravelBags.it This test is not yet approved or cleared by the [place] FDA and  has been authorized for detection and/or diagnosis of SARS-CoV-2 by  FDA under an Emergency Use Authorization (EUA). This EUA will remain  in effect (meaning this test can be used) for the duration of the  Covid-19 declaration under Section 564(b)(1) of the Act, 21  U.S.C. section 360bbb-3(b)(1), unless the authorization is  terminated or revoked. Performed at [facility], [place] [address], [place], [place] [address]   Blood Cultures (routine x 2)     Status: None (Preliminary result)   Collection Time: 08/22/19  7:52 AM   Specimen: BLOOD LEFT FOREARM  Result Value Ref Range Status   Specimen Description BLOOD LEFT FOREARM  Final   Special Requests   Final    BOTTLES DRAWN AEROBIC AND ANAEROBIC Blood Culture results may not be optimal due to an inadequate volume of blood received in culture bottles   Culture   Final    NO GROWTH 1 DAY Performed at [facility], [place] [address], [place], [place] [address]    Report Status PENDING  Incomplete  Urine culture     Status: None   Collection Time: 08/22/19 11:02 AM   Specimen: Urine, Catheterized  Result Value Ref Range Status   Specimen Description URINE,  CATHETERIZED  Final   Special Requests  NONE  Final   Culture   Final    NO GROWTH Performed at [facility], [place] [address] [place], [place] [address]    Report Status 08/23/2019 FINAL  Final  MRSA PCR Screening     Status: None   Collection Time: 08/22/19 12:17 PM   Specimen: Nasal Mucosa; Nasopharyngeal  Result Value Ref Range Status   MRSA by PCR NEGATIVE NEGATIVE Final    Comment:        The GeneXpert MRSA Assay (FDA approved for NASAL specimens only), is one component of a comprehensive MRSA colonization surveillance program. It is not intended to diagnose MRSA infection nor to guide or monitor treatment for MRSA infections. Performed at [facility], [place] [address] [place], [place] [address]     Lipid Panel Recent Labs    08/24/19 0354  TRIG 73    Studies/Results: CT HEAD WO CONTRAST  Result Date: 08/22/2019 CLINICAL DATA:  Unresponsive patient EXAM: CT HEAD WITHOUT CONTRAST TECHNIQUE: Contiguous axial images were obtained from the base of the skull through the vertex without intravenous contrast. COMPARISON:  07/22/2019 FINDINGS: Brain: A The brainstem, cerebellum, cerebral peduncles, thalami, basal ganglia, basilar cisterns, and ventricular system appear within normal limits. Periventricular white matter and corona radiata hypodensities favor chronic ischemic microvascular white matter disease. No intracranial hemorrhage, mass lesion, or acute CVA. Vascular: There is atherosclerotic calcification of the cavernous carotid arteries bilaterally. Skull: Unremarkable Sinuses/Orbits: Chronic ethmoid sinusitis. Other: No supplemental non-categorized findings. IMPRESSION: 1. No acute intracranial findings. 2. Periventricular white matter and corona radiata hypodensities favor chronic ischemic microvascular white matter disease. 3. Chronic ethmoid sinusitis. Electronically Signed   By: Van Clines M.D.   On: 08/22/2019 08:40   MR BRAIN WO CONTRAST  Result  Date: 08/22/2019 CLINICAL DATA:  Possible seizures EXAM: MRI HEAD WITHOUT CONTRAST TECHNIQUE: Multiplanar, multiecho pulse sequences of the brain and surrounding structures were obtained without intravenous contrast. COMPARISON:  2011 FINDINGS: Brain: There is no acute infarction or intracranial hemorrhage. There is no intracranial mass, mass effect, or edema. There is no hydrocephalus or extra-axial fluid collection. Patchy T2 hyperintensity in the supratentorial and pontine white matter is nonspecific but may reflect mild to moderate chronic microvascular ischemic changes. Prominence of the ventricles and sulci reflects generalized parenchymal volume loss. Relatively disproportionate ventricular prominence is favored to reflect central volume loss rather than hydrocephalus. This is not substantially progressed since 2011. Vascular: Major vessel flow voids at the skull base are preserved. Skull and upper cervical spine: Normal marrow signal is preserved. Sinuses/Orbits: Minor mucosal thickening.  Orbits are unremarkable. Other: Sella is unremarkable.  Trace mastoid fluid opacification. IMPRESSION: No acute infarction, hemorrhage, or mass. Chronic microvascular ischemic changes. Electronically Signed   By: Macy Mis M.D.   On: 08/22/2019 15:25   DG Chest Port 1 View  Result Date: 08/22/2019 CLINICAL DATA:  Found unresponsive. EXAM: PORTABLE CHEST 1 VIEW COMPARISON:  07/16/2016 FINDINGS: The endotracheal tube is 3 cm above the carina. The cardiac silhouette, mediastinal and hilar contours are within normal limits and stable. Moderate calcification of the thoracic aorta. The lungs are clear. No pleural effusions or pneumothorax. The bony thorax is intact. IMPRESSION: 1. Endotracheal tube in good position. 2. No acute cardiopulmonary findings. Electronically Signed   By: Marijo Sanes M.D.   On: 08/22/2019 07:56   EEG adult  Result Date: 08/22/2019 Lora Havens, MD     08/22/2019 12:00 PM Patient Name:  Willie [name] MRN: [id number]  Epilepsy Attending: Lora Havens Referring Physician/Provider: Laurey Morale, NP Date: 08/22/2019 Duration: 26.08 minutes Patient history: 79 year old male who was last seen in ED for shaking and worsening cognition in the setting of dementia who presented again with altered mental status and concern for seizures.  EEG to evaluate for seizures. Level of alertness: Awake AEDs during EEG study: Versed Technical aspects: This EEG study was done with scalp electrodes positioned according to the 10-20 International system of electrode placement. Electrical activity was acquired at a sampling rate of '500Hz'  and reviewed with a high frequency filter of '70Hz'  and a low frequency filter of '1Hz' . EEG data were recorded continuously and digitally stored. Description: During awake state, no clear posterior dominant rhythm was seen.  EEG showed continuous generalized 5 to 7 Hz theta activity.  Hyperventilation photic stimulation were not performed. Abnormality -Continuous slow, generalized IMPRESSION: This study is suggestive of mild to moderate diffuse encephalopathy, nonspecific to etiology.  No seizures or epileptiform discharges were seen throughout the recording. Priyanka [name]    Medications:  Scheduled: . atorvastatin  10 mg Per Tube Daily  . baclofen  10 mg Per Tube TID  . chlorhexidine gluconate (MEDLINE KIT)  15 mL Mouth Rinse BID  . Chlorhexidine Gluconate Cloth  6 each Topical Daily  . heparin  5,000 Units Subcutaneous Q8H  . insulin aspart  0-6 Units Subcutaneous Q4H  . levothyroxine  137 mcg Per Tube QAC breakfast  . LORazepam  2 mg Intravenous Once  . mouth rinse  15 mL Mouth Rinse 10 times per day  . metoprolol tartrate  5 mg Intravenous Q6H  . pantoprazole (PROTONIX) IV  40 mg Intravenous QHS  . phenytoin (DILANTIN) IV  100 mg Intravenous Q8H   Continuous: . fosPHENYtoin (CEREBYX) IV    . lactated ringers 75 mL/hr at 08/23/19 1811  . levETIRAcetam 500 mg  (08/23/19 2235)   Spot EEG 3/12: This study is suggestive of mild to moderate diffuse encephalopathy, nonspecific to etiology.  No seizures or epileptiform discharges were seen throughout the recording.  Assessment:  79 year old male with shaking spells and AMS. On initial exam in the ED while not on sedation the patient was able to follow simple commands. Neurology team then witnessed 4 shaking episodes that happened over the course of 2 minutes. Patient was able to respond during these shaking episodes. Eyes did not deviate and patient did not lose consciousness. Episodes began with low amplitude shaking that gradually increased over about 15 seconds. Shaking would cease, and then start up again following the same pattern of increasing amplitude. Seemed to be mostly arms, then progressed to legs. 1. Was started on Keppra by his PCP for shaking spells over the past 3-4 weeks. Unclear if these are due to epileptic seizures or pseudoseizures.  2. CT head showed no acute intracranial findings. MRI brain showed no acute abnormality; diffuse atrophy and chronic microvascular ischemic changes were noted.  3. LTM EEG report for this AM (08/24/19): This study is within normal limits. No seizures or epileptiform discharges were seen throughout the recording. Multiple events were captured during which patient was noted to have generalized non rhythmic whole body twitching without concomitant eeg change. These events were most likely non epileptic events.  Recommendations: 1. Decreasing Keppra dosage to 250 mg po BID (ordered)  2. Continue to monitor clinically. 3. Inpatient Psychology evaluation to assess recent psychosocial stressors. Also may need outpatient Psychology followup.  4. Per [facility] statutes, patients with  seizures are not allowed to drive until  they have been seizure-free for six months. Use caution when using heavy equipment or power tools. Avoid working on ladders or at heights.  Take showers instead of baths. Ensure the water temperature is not too high on the home water heater. Do not go swimming alone. When caring for infants or small children, sit down when holding, feeding, or changing them to minimize risk of injury to the child in the event you have a seizure. Also, Maintain good sleep hygiene. Avoid alcohol. 5. 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 Recommend outpatient Neurology follow up for possible tapering off of Keppra if he remains spell-free with Psychological counseling.  6. Neurohospitalist service will sign off. Please call if there are additional questions.    LOS: 2 days   '@Electronically'  signed: Dr. Kerney Elbe 08/24/2019  7:27 AM

## 2019-08-24 NOTE — Progress Notes (Signed)
vLTM EEG complete. Slight redness at electrode site Fz. No other skin breakdown noted

## 2019-08-24 NOTE — Plan of Care (Signed)

## 2019-08-24 NOTE — Evaluation (Signed)
Physical Therapy Evaluation Patient Details Name: Willie [name] MRN: [id number] DOB: 1940-12-27 Today's Date: 08/24/2019   History of Present Illness  79 yo male admitted with acute met encephalopathy. He was found down, unresponsive at ALF. Possible Seizure?-currently undergoing neurology workup. Hx of dementia, prostate Ca, Sz, CVA, ETOH abuse.  Clinical Impression  Bed level eval only on today (okay with RN). Pt is currently undergoing EEG monitoring. He is alert and participatory. He followed commands well. UE/LE assessment performed along with some bed exercises. Will continue to follow and progress activity as able. At this time, recommendation is for return to ALF. Will continue to follow and update recommendations as necessary.     Follow Up Recommendations Supervision/Assistance - 24 hour;Home health PT(return to ALF-depending on progress/continuing to assess)    Equipment Recommendations  None recommended by PT    Recommendations for Other Services       Precautions / Restrictions Precautions Precautions: Fall Restrictions Weight Bearing Restrictions: No      Mobility  Bed Mobility               General bed mobility comments: NT-currently undergoing long term EEG monitoring. RN was okay with bed level activity on today.  Transfers                    Ambulation/Gait                Stairs            Wheelchair Mobility    Modified Rankin (Stroke Patients Only)       Balance                                             Pertinent Vitals/Pain Pain Assessment: No/denies pain    Home Living Family/patient expects to be discharged to:: Assisted living               Home Equipment: Wheelchair - manual;Walker - 2 wheels      Prior Function Level of Independence: Independent with assistive device(s)         Comments: Transfers only-with modified independence, per pt. Uses wheelchair for mobility.      Hand Dominance        Extremity/Trunk Assessment   Upper Extremity Assessment Upper Extremity Assessment: Generalized weakness    Lower Extremity Assessment Lower Extremity Assessment: Generalized weakness       Communication   Communication: No difficulties  Cognition Arousal/Alertness: Awake/alert Behavior During Therapy: WFL for tasks assessed/performed Overall Cognitive Status: History of cognitive impairments - at baseline                                 General Comments: He seemed to know he was taken to [facility] but not that he is here currently      General Comments      Exercises General Exercises - Upper Extremity Shoulder Flexion: AROM;Both;10 reps;Supine General Exercises - Lower Extremity Heel Slides: AROM;Both;10 reps;Supine Straight Leg Raises: AROM;Both;10 reps;Supine   Assessment/Plan    PT Assessment Patient needs continued PT services  PT Problem List Decreased strength;Decreased mobility;Decreased balance;Decreased cognition;Decreased knowledge of use of DME       PT Treatment Interventions Therapeutic activities;Therapeutic exercise;Patient/family education;Functional mobility training;Balance training;Wheelchair mobility training    PT Goals (  Current goals can be found in the Care Plan section)  Acute Rehab PT Goals Patient Stated Goal: none stated PT Goal Formulation: With patient Time For Goal Achievement: 09/07/19 Potential to Achieve Goals: Fair    Frequency Min 3X/week   Barriers to discharge        Co-evaluation               AM-PAC PT "6 Clicks" Mobility  Outcome Measure Help needed turning from your back to your side while in a flat bed without using bedrails?: A Lot Help needed moving from lying on your back to sitting on the side of a flat bed without using bedrails?: A Lot Help needed moving to and from a bed to a chair (including a wheelchair)?: A Lot Help needed standing up from a chair using  your arms (e.g., wheelchair or bedside chair)?: A Lot Help needed to walk in hospital room?: Total Help needed climbing 3-5 steps with a railing? : Total 6 Click Score: 10    End of Session   Activity Tolerance: Patient tolerated treatment well Patient left: in bed;with call bell/phone within reach   PT Visit Diagnosis: Muscle weakness (generalized) (M62.81);Other abnormalities of gait and mobility (R26.89);Hemiplegia and hemiparesis Hemiplegia - Right/Left: Right Hemiplegia - caused by: Cerebral infarction    Time: 1140-1148 PT Time Calculation (min) (ACUTE ONLY): 8 min   Charges:   PT Evaluation $PT Eval Moderate Complexity: 1 Mod             Desmen Schoffstall P, PT Acute Rehabilitation

## 2019-08-24 NOTE — Progress Notes (Signed)
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  Hand gripping during episodes unclear if actual seizures On admission AKI 38/1.44 baseline 1.08, lactic acidosis 3.5, white count 4.6, CT head no acute findings-periventricular hypodensities chronic ischemia MRI no acute infarct CXR no acute cardiopulmonary findings After MRI was following commands and was immediately able to be extubated  Data Reviewed:  BUN/creatinine 38/1.44-->22/1.2 Lactic acid 3.5-->2.1 White count up to 12 Hemoglobin 11.3 Platelet 249 EEG 3/12 suggestive of mild diffuse encephalopathy nonspecific  Assessment & Plan:  Seizure like activity vs tremor vs provoked from hypoglycemia LTEEG in process currently--cont Fosphenytoin 1.36, Keppra 500 iv bid, phosphenytoin 100 q8 Cont Baclofen 10 mg tid Per neuro further work-up and planning If ok with them ,can transfer out ICU Dysphagia Cleared by SLP for dys 3 diet--follow recs as progresses Hypoxic respiratory  failure Extubated quickly--no o2 requirement AKI hypomagnesemia Likely pre-renal ATN--improved--cont Lactated ringer 75 cc/h Re-check magnesium with today's labs Prostate cancer stg 1 c s/p XRT completion 06/11/2019 OP further follow up with Urology as indicated DM TY 2 with type B lactic acidosis Resume lower dose metformin on d/c CBG checks here 84-88 eating only 10 % meals Add glucerna HTN Irbesartan 75 held on admit--resuming Metoprolol XL  At lower dose 12.5 Vascular dementia prior EtOH Seems reasonably oriente dot me per H/o Resume   Willie [name] updated [phone] on 08/24/2019  Subjective: gettign EEG Awake-reasonably orients can tell me placemontha nd struggles a little bit with yea ran ddate Tells me daughte rwill be buy later--correclty gives her name Some foot pain occasionally--otehrwsie no other c/o  Consultants:   neuro  Objective: Vitals:   08/24/19 0500 08/24/19 0600 08/24/19 0700 08/24/19 0800  BP:  (!) 162/76 121/67 138/63  Pulse:  95 72 83  Resp:  (!) 9 13 19   Temp:      TempSrc:      SpO2:  98% 98% 98%  Weight: 66.8 kg     Height:        Intake/Output Summary (Last 24 hours) at 08/24/2019 0804 Last data filed at 08/24/2019 0700 Gross per 24 hour  Intake 1665.37 ml  Output 1075 ml  Net 590.37 ml   Filed Weights   08/22/19 0915 08/22/19 1200 08/24/19 0500  Weight: 68 kg 64.2 kg 66.8 kg    Examination: Awake alert pleasant oriented x 2, no focal deficit Mallampati 1-2, false teeth cta b no added sound no rales no rhonchi abd soft nt nd no rebound finger nose finger intact power in UE/LE's iis 5/5 Reflexes and gait not tested   Scheduled  Meds: . atorvastatin  10 mg Per Tube Daily  . baclofen  10 mg Oral TID  . Chlorhexidine Gluconate Cloth  6 each Topical Daily  . darifenacin  7.5 mg Oral Daily  . Glucerna  237 mL Oral BID BM  . heparin  5,000 Units Subcutaneous Q8H  . insulin aspart  0-6 Units Subcutaneous Q4H  . levothyroxine  137  mcg Per Tube QAC breakfast  . LORazepam  2 mg Intravenous Once  . mouth rinse  15 mL Mouth Rinse BID  . metoprolol succinate  12.5 mg Oral Daily  . metoprolol tartrate  5 mg Intravenous Q6H  . pantoprazole (PROTONIX) IV  40 mg Intravenous QHS  . phenytoin (DILANTIN) IV  100 mg Intravenous Q8H   Continuous Infusions: . fosPHENYtoin (CEREBYX) IV    . lactated ringers 75 mL/hr at 08/24/19 0700  . levETIRAcetam Stopped (08/23/19 2250)     LOS: 2 days   Time spent: [place], MD [facility]  08/24/2019, 8:04 AM

## 2019-08-24 NOTE — Procedures (Addendum)
Patient Name: Willie [name]  MRN: [id number]  Epilepsy Attending: Lora Havens  Referring Physician/Provider: Dr Kerney Elbe Duration: 08/23/2019 0908 to /314/2021 0908 Total study duration: 24 hours  Patient history: 79 year old male who was last seen in ED for shaking and worsening cognition in the setting of dementia who presented again with altered mental status and concern for seizures.  EEG to evaluate for seizures.  Level of alertness: Awake, asleep  AEDs during EEG study: PHT, LEV  Technical aspects: This EEG study was done with scalp electrodes positioned according to the 10-20 International system of electrode placement. Electrical activity was acquired at a sampling rate of 500Hz  and reviewed with a high frequency filter of 70Hz  and a low frequency filter of 1Hz . EEG data were recorded continuously and digitally stored.   Description:  The posterior dominant rhythm consists of 8-9 Hz activity of moderate voltage (25-35 uV) seen predominantly in posterior head regions, symmetric and reactive to eye opening and eye closing. Sleep was characterized by vertex waves, sleep spindles (12-14hz ), maximal frontocentral.           Multiple events were captured on 08/23/2019 at 1008, 1245, 1725 and 1748 during which patient was noted to have generalized non rhythmic whole body twitching. Concomitant eeg before, during and after the event did not show any eeg change to suggest seizure.   IMPRESSION: This study is within normal limits. No seizures or epileptiform discharges were seen throughout the recording.  Multiple events were captured as described above during which patient was noted to have generalized non rhythmic whole body twitching without concomitant eeg change. These events were most likely non epileptic events.  [name]

## 2019-08-24 NOTE — Progress Notes (Signed)
Report called to RN 3w28. Patients daughter in law at the bedside. She is requesting possible SNF vs AL for discharge as she feels he may need more care at this time. Will place CSW consult.Patient with no complaints at the current time.Will transfer via bed.

## 2019-08-24 NOTE — Progress Notes (Signed)
Patient received to room 3W28   She and daughter-in-law were introduced to staff and to unit routine.  Bed in low position and appropriate bed alarm setting in place.  Call placed to neuro PA-C as daughter-in-law had multiple questions re: patient's lab and EEG results.

## 2019-08-24 NOTE — Progress Notes (Signed)
Patient extremely agitated.  Attempting to get OOB, pulling at lines, and kicking at staff.  Unable to re-direct due to confused state.  Order for soft wrist / ankle restraints obtained.  Family notified this am that such an order may be necessary and were willing to do whatever was necessary to ensure patient safety.

## 2019-08-25 LAB — CBC WITH DIFFERENTIAL/PLATELET
Abs Immature Granulocytes: 0.05 10*3/uL (ref 0.00–0.07)
Basophils Absolute: 0 10*3/uL (ref 0.0–0.1)
Basophils Relative: 0 %
Eosinophils Absolute: 0 10*3/uL (ref 0.0–0.5)
Eosinophils Relative: 0 %
HCT: 32.8 % — ABNORMAL LOW (ref 39.0–52.0)
Hemoglobin: 10.9 g/dL — ABNORMAL LOW (ref 13.0–17.0)
Immature Granulocytes: 1 %
Lymphocytes Relative: 25 %
Lymphs Abs: 2.1 10*3/uL (ref 0.7–4.0)
MCH: 31.2 pg (ref 26.0–34.0)
MCHC: 33.2 g/dL (ref 30.0–36.0)
MCV: 94 fL (ref 80.0–100.0)
Monocytes Absolute: 0.5 10*3/uL (ref 0.1–1.0)
Monocytes Relative: 6 %
Neutro Abs: 5.7 10*3/uL (ref 1.7–7.7)
Neutrophils Relative %: 68 %
Platelets: 219 10*3/uL (ref 150–400)
RBC: 3.49 MIL/uL — ABNORMAL LOW (ref 4.22–5.81)
RDW: 11.9 % (ref 11.5–15.5)
WBC: 8.4 10*3/uL (ref 4.0–10.5)
nRBC: 0 % (ref 0.0–0.2)

## 2019-08-25 LAB — RENAL FUNCTION PANEL
Albumin: 2.8 g/dL — ABNORMAL LOW (ref 3.5–5.0)
Anion gap: 14 (ref 5–15)
BUN: 15 mg/dL (ref 8–23)
CO2: 23 mmol/L (ref 22–32)
Calcium: 8.5 mg/dL — ABNORMAL LOW (ref 8.9–10.3)
Chloride: 106 mmol/L (ref 98–111)
Creatinine, Ser: 1.18 mg/dL (ref 0.61–1.24)
GFR calc Af Amer: >60 mL/min (ref >60)
GFR calc non Af Amer: 58 mL/min — ABNORMAL LOW (ref >60)
Glucose, Bld: 69 mg/dL — ABNORMAL LOW (ref 70–99)
Phosphorus: 2.4 mg/dL — ABNORMAL LOW (ref 2.5–4.6)
Potassium: 3.8 mmol/L (ref 3.5–5.1)
Sodium: 143 mmol/L (ref 135–145)

## 2019-08-25 LAB — SARS CORONAVIRUS 2 (TAT 6-24 HRS): SARS Coronavirus 2: NEGATIVE

## 2019-08-25 LAB — GLUCOSE, CAPILLARY
Glucose-Capillary: 109 mg/dL — ABNORMAL HIGH (ref 70–99)
Glucose-Capillary: 125 mg/dL — ABNORMAL HIGH (ref 70–99)
Glucose-Capillary: 134 mg/dL — ABNORMAL HIGH (ref 70–99)
Glucose-Capillary: 158 mg/dL — ABNORMAL HIGH (ref 70–99)
Glucose-Capillary: 229 mg/dL — ABNORMAL HIGH (ref 70–99)
Glucose-Capillary: 68 mg/dL — ABNORMAL LOW (ref 70–99)
Glucose-Capillary: 69 mg/dL — ABNORMAL LOW (ref 70–99)
Glucose-Capillary: 78 mg/dL (ref 70–99)

## 2019-08-25 LAB — TRIGLYCERIDES: Triglycerides: 78 mg/dL (ref <150)

## 2019-08-25 MED ORDER — LEVOTHYROXINE SODIUM 25 MCG PO TABS: 137.0000 ug | ORAL_TABLET | Freq: Every day | ORAL | Status: DC

## 2019-08-25 MED ORDER — LEVOTHYROXINE SODIUM 25 MCG PO TABS
137.0000 ug | ORAL_TABLET | Freq: Every day | ORAL | Status: DC
  Administered 2019-08-25 – 2019-08-27 (×3): 137 ug via ORAL
  Filled 2019-08-25 (×2): qty 1

## 2019-08-25 MED ORDER — PANTOPRAZOLE SODIUM 40 MG PO TBEC
40.0000 mg | DELAYED_RELEASE_TABLET | Freq: Every day | ORAL | Status: DC
  Administered 2019-08-25 – 2019-08-26 (×2): 40 mg via ORAL
  Filled 2019-08-25 (×2): qty 1

## 2019-08-25 MED ORDER — ATORVASTATIN CALCIUM 10 MG PO TABS
10.0000 mg | ORAL_TABLET | Freq: Every day | ORAL | Status: DC
  Administered 2019-08-25 – 2019-08-27 (×3): 10 mg via ORAL
  Filled 2019-08-25 (×2): qty 1

## 2019-08-25 MED ORDER — DEXTROSE 50 % IV SOLN
INTRAVENOUS | Status: AC
  Administered 2019-08-25: 12.5 g
  Filled 2019-08-25: qty 50

## 2019-08-25 MED ORDER — DEXTROSE 50 % IV SOLN
INTRAVENOUS | Status: AC
  Administered 2019-08-25: 25 mL
  Filled 2019-08-25: qty 50

## 2019-08-25 NOTE — Progress Notes (Signed)
Hypoglycemic Event  CBG: 69  Treatment: 25 g Dextrose  Symptoms: Asymptomatic  Follow-up CBG: [id number] CBG Result:109  Possible Reasons for Event: Pt refusing oral.  Comments/MD notified: Carley Hammed, BSN, RN

## 2019-08-25 NOTE — Progress Notes (Signed)
Physical Therapy Treatment Patient Details Name: Willie [name] MRN: [id number] DOB: [birth year] Today's Date: 08/25/2019    History of Present Illness 79 yo male admitted with acute met encephalopathy. He was found down, unresponsive at ALF. No evidence of seizure activity in neurology workup. Hx of dementia, prostate Ca, Sz, CVA, ETOH abuse.    PT Comments    Pt in bed with daughter present upon arrival of PT, agreeable to session with focus on progressing functional mobility and activity tolerance. The pt continues to present with limitations in functional mobility, dynamic stability, and activity tolerance compared to their prior level of function and independence due to above dx. The pt was previously living at ALF where he was independent for most of the day. The pt was able to demo multiple transfers and a short ambulation in the hallway, but had multiple posterior LOB and multiple minor LOB requiring minA during ambulation, often due to poor RW navigation and management. The pt would benefit from CIR to maximize functional mobility and safety prior to return to ALF.     Follow Up Recommendations  CIR;Supervision/Assistance - 24 hour     Equipment Recommendations  Other (comment)(defer to post-acute)    Recommendations for Other Services       Precautions / Restrictions Precautions Precautions: Fall Restrictions Weight Bearing Restrictions: No    Mobility  Bed Mobility Overal bed mobility: Needs Assistance Bed Mobility: Supine to Sit     Supine to sit: Min assist;HOB elevated     General bed mobility comments: minA with trunk and BLE movement with VCs for sequencing  Transfers Overall transfer level: Needs assistance Equipment used: Rolling walker (2 wheeled) Transfers: Sit to/from Stand Sit to Stand: Min guard         General transfer comment: pt needed minA for initial stand, then progressed to minG for remainder of transfers, benefits from VCs for hand  placement. 5X STS in 60 sec  Ambulation/Gait Ambulation/Gait assistance: Min guard;Min assist Gait Distance (Feet): 120 Feet Assistive device: Rolling walker (2 wheeled) Gait Pattern/deviations: Step-through pattern;Decreased stride length;Narrow base of support Gait velocity: decreased, pt able to increase to "faster" pace but is unsafe to maintain due to vision deficits and poor reactive stability Gait velocity interpretation: <1.8 ft/sec, indicate of risk for recurrent falls General Gait Details: Pt with slow and generally unsteady gait, mostly minG but instances of minA needed to regain balance. pt with poor vision (especially on R side) and ran into multiple objects, needs minA to reposition RW after running into something.   Stairs             Wheelchair Mobility    Modified Rankin (Stroke Patients Only)       Balance Overall balance assessment: Needs assistance Sitting-balance support: No upper extremity supported;Feet supported Sitting balance-Leahy Scale: Good Sitting balance - Comments: supervision Postural control: Posterior lean   Standing balance-Leahy Scale: Fair Standing balance comment: pt able to stand without RW, but needs BUE support for ambulation. strong posterior lean with initial stand, improved with reps                            Cognition Arousal/Alertness: Awake/alert Behavior During Therapy: WFL for tasks assessed/performed Overall Cognitive Status: History of cognitive impairments - at baseline  General Comments: Daughter present and stating that the pt has deficits at baseline, does not report any new or different deficits.      Exercises      General Comments        Pertinent Vitals/Pain Pain Assessment: No/denies pain    Home Living                      Prior Function            PT Goals (current goals can now be found in the care plan section) Acute Rehab  PT Goals Patient Stated Goal: none stated PT Goal Formulation: With patient Time For Goal Achievement: 09/07/19 Potential to Achieve Goals: Fair Progress towards PT goals: Progressing toward goals    Frequency    Min 3X/week      PT Plan Discharge plan needs to be updated    Co-evaluation              AM-PAC PT "6 Clicks" Mobility   Outcome Measure  Help needed turning from your back to your side while in a flat bed without using bedrails?: A Little Help needed moving from lying on your back to sitting on the side of a flat bed without using bedrails?: A Little Help needed moving to and from a bed to a chair (including a wheelchair)?: A Lot Help needed standing up from a chair using your arms (e.g., wheelchair or bedside chair)?: A Little Help needed to walk in hospital room?: A Lot Help needed climbing 3-5 steps with a railing? : A Lot 6 Click Score: 15    End of Session Equipment Utilized During Treatment: Gait belt Activity Tolerance: Patient tolerated treatment well Patient left: with call bell/phone within reach;in chair;with chair alarm set;with family/visitor present Nurse Communication: Mobility status(pt need for lunch tray) PT Visit Diagnosis: Muscle weakness (generalized) (M62.81);Other abnormalities of gait and mobility (R26.89);Hemiplegia and hemiparesis Hemiplegia - Right/Left: Right Hemiplegia - dominant/non-dominant: Dominant Hemiplegia - caused by: Cerebral infarction     Time: 9179-1505 PT Time Calculation (min) (ACUTE ONLY): 63 min  Charges:  $Gait Training: 23-37 mins $Therapeutic Exercise: 8-22 mins $Therapeutic Activity: 8-22 mins                     Karma Ganja, PT, DPT   Acute Rehabilitation Department Pager #: [phone]   Otho Bellows 08/25/2019, 4:28 PM

## 2019-08-25 NOTE — Progress Notes (Signed)
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  Hand gripping during episodes unclear if actual seizures On admission AKI 38/1.44 baseline 1.08, lactic acidosis 3.5, white count 4.6, CT head no acute findings-periventricular hypodensities chronic ischemia MRI no acute infarct CXR no acute cardiopulmonary findings After MRI was following commands and was immediately able to be extubated  Data Reviewed:  BUN/creatinine 38/1.44-->22/1.2-->15/1.1 White count up to 12-->8.4 Hemoglobin 10.9 Platelet 249 EEG 3/12 suggestive of mild diffuse encephalopathy nonspecific  Assessment & Plan:  Seizure like activity vs tremor vs provoked from hypoglycemia LTEEG nonsuggestive epileptic seizures transitioned back to [place] 250 twice daily DM TY 2 with type B lactic acidosis Continues to have hypoglycemia in the 70s low 100-stop sliding scale coverage at this time and maintain hypoglycemic protocol Dysphagia Cleared by SLP for dys 3 diet--follow recs  as progresses Hypoxic respiratory failure Extubated quickly--no o2 requirement AKI hypomagnesemia Likely pre-renal ATN--improved--saline lock-magnesium improved to 1.8 on the 14th no further tracking Prostate cancer stg 1 c s/p XRT completion 06/11/2019 OP further follow up with Urology as indicated HTN Irbesartan 75 held on admit--resuming Metoprolol XL  At lower dose 12.5 Vascular dementia + delirium at times prior EtOH Confusion and seems to sundown in the afternoons-redirect gently-have asked nursing to discontinue restraints and monitor behavior as this is only intermittent  Abed Croxford updated [id number] on 08/25/2019  Subjective:  Awake and coherent can tell me the date as well as the year-looks like he was agitated early in the morning and late last night No fever no chills reported Was able to however take off restraints this morning eat comfortably and did not seem to be confused  Consultants:   neuro  Objective: Vitals:   08/25/19 0044 08/25/19 0340 08/25/19 0342 08/25/19 0343  BP: 139/76 (!) 162/63 (!) 150/69   Pulse: 93 95    Resp: 18 16    Temp: 98.4 F (36.9 C) 97.8 F (36.6 C)    TempSrc: Axillary Axillary    SpO2: 100% 98%    Weight:    62.3 kg  Height:        Intake/Output Summary (Last 24 hours) at 08/25/2019 0737 Last data filed at 08/25/2019 0314 Gross per 24 hour  Intake 1179.33 ml  Output --  Net 1179.33 ml   Filed Weights   08/22/19 1200 08/24/19 0500 08/25/19 0343  Weight: 64.2 kg 66.8 kg 62.3 kg    Examination: Awake coherent no distress EOMI-right eye seems matted S1-S2 no murmur rub or gallop Chest clear no added sound no rales Abdomen soft no rebound no guarding  No lower extremity edema ROM intact skin skin   Scheduled Meds: . atorvastatin  10 mg Per Tube Daily  . baclofen  10 mg Oral TID  . darifenacin  7.5 mg Oral Daily  . feeding supplement (GLUCERNA SHAKE)  237 mL Oral BID BM  . heparin  5,000 Units Subcutaneous Q8H  .  insulin aspart  0-6 Units Subcutaneous Q4H  . levETIRAcetam  250 mg Oral BID  . levothyroxine  137 mcg Per Tube QAC breakfast  . LORazepam  2 mg Intravenous Once  . mouth rinse  15 mL Mouth Rinse BID  . metoprolol succinate  12.5 mg Oral Daily  . pantoprazole (PROTONIX) IV  40 mg Intravenous QHS   Continuous Infusions: . lactated ringers 75 mL/hr at 08/24/19 2147     LOS: 3 days   Time spent: [place], MD [facility]  08/25/2019, 7:37 AM

## 2019-08-25 NOTE — Progress Notes (Signed)
Rehab Admissions Coordinator Note:  Patient was screened by Cleatrice Burke for appropriateness for an Inpatient Acute Rehab Consult per PT and OT recommendations. Pt from ALF. Marland Kitchen  At this time, we are recommending HH at ALF vs SNF if unable to return to ALF. Patient does not have the medical neccesity for inpatient rehab admit. Please call me with any questions.  Cleatrice Burke RN MSN 08/25/2019, 5:30 PM  I can be reached at [phone].

## 2019-08-25 NOTE — Procedures (Addendum)
Patient Name:Willie [name] [id number] Epilepsy Attending:Adrain Nesbit [name] Referring Physician/Provider:Dr Kerney Elbe Duration:08/24/2019 [id number] to /314/2021 1230 Total study duration: 27 hours  Patient history:79 year old male who was last seen in ED for shaking and worsening cognition in the setting of dementia who presented again with altered mental status and concern for seizures. EEG to evaluate for seizures.  Level of alertness:Awake, asleep  AEDs during EEG study:PHT, LEV  Technical aspects: This EEG study was done with scalp electrodes positioned according to the 10-20 International system of electrode placement. Electrical activity was acquired at a sampling rate of 500Hz  and reviewed with a high frequency filter of 70Hz  and a low frequency filter of 1Hz . EEG data were recorded continuously and digitally stored.  Description:  The posterior dominant rhythm consists of 8-9 Hz activity of moderate voltage (25-35 uV) seen predominantly in posterior head regions, symmetric and reactive to eye opening and eye closing. Sleep was characterized by vertex waves, sleep spindles (12-14hz ), maximal frontocentral.  IMPRESSION: This study is within normal limits. No seizures or epileptiform discharges were seen throughout the recording.  Therron Sells [name]

## 2019-08-25 NOTE — TOC Initial Note (Signed)
Transition of Care [facility]) - Initial/Assessment Note    Patient Details  Name: Willie [name] MRN: [id number] Date of Birth: 03-Jul-1940  Transition of [facility]) CM/SW Contact:    Emeterio Reeve, [place] Phone Number: 08/25/2019, 4:01 PM  Clinical Narrative:                 CSW spoke with Daughter in law Neeraj Pedicini [phone]) via phone to discuss pt. CSW introduced herself and explained role. [name] stated that PTA pt was in assisted living and was independent with his ADL's.   PT/OT are recommending CIR or 24 hour supervision; we are waiting to hear from CIR. [name] stated if CIR was not an option they will most likely have home health at his AFL along with hiring 24 hour help. CSW will follow up with [name].   Expected Discharge Plan: Skilled Nursing Facility Barriers to Discharge: Continued Medical Work up   Patient Goals and CMS Choice Patient states their goals for this hospitalization and ongoing recovery are:: To return back to assited living CMS Medicare.gov Compare Post Acute Care list provided to:: Patient Represenative (must comment)    Expected Discharge Plan and Services Expected Discharge Plan: [place] arrangements for the past 2 months: [place]                                      Prior Living Arrangements/Services Living arrangements for the past 2 months: [place] Lives with:: Facility Resident Patient language and need for interpreter reviewed:: Yes Do you feel safe going back to the place where you live?: Yes      Need for Family Participation in Patient Care: Yes (Comment) Care giver support system in place?: Yes (comment)   Criminal Activity/Legal Involvement Pertinent to Current Situation/Hospitalization: No - Comment as needed  Activities of Daily Living      Permission Sought/Granted Permission sought to share information with : Family Supports Permission granted to share  information with : Yes, Verbal Permission Granted  Share Information with NAME: Ian Franzoni  Permission granted to share info w AGENCY: SNF  Permission granted to share info w Relationship: Daughter in law  Permission granted to share info w Contact Information: [phone]  Emotional Assessment Appearance:: Appears stated age Attitude/Demeanor/Rapport: Unable to Assess Affect (typically observed): Unable to Assess Orientation: : Oriented to Self      Admission diagnosis:  Seizure-like activity ([place]) [R56.9] Altered mental status, unspecified altered mental status type [id number] Acute metabolic encephalopathy [id number] Patient Active Problem List   Diagnosis Date Noted  . Malignant neoplasm of prostate ([place]) 01/21/2019  . Essential hypertension 01/01/2019  . Generalized weakness 01/01/2019  . Acute metabolic encephalopathy 99991111  . Controlled type 2 diabetes mellitus with hypoglycemia ([place]) 12/31/2018  . Hypothyroidism 12/31/2018  . Normocytic normochromic anemia 12/31/2018  . Hyponatremia 12/31/2018   PCP:  Josetta Huddle, MD Pharmacy:  No Pharmacies Listed    Social Determinants of Health (SDOH) Interventions    Readmission Risk Interventions No flowsheet data found.

## 2019-08-25 NOTE — Progress Notes (Addendum)
  Speech Language Pathology Treatment: Dysphagia  Patient Details Name: Willie [name] MRN: [id number] DOB: 08-Jan-1941 Today's Date: 08/25/2019 Time: 0906-1000 SLP Time Calculation (min) (ACUTE ONLY): 54 min  Assessment / Plan / Recommendation Clinical Impression  Patient seen for f/u diet tolerance assessment. Patient initially lethargic however following max tactile stimulation, aroused sufficiently for am meal. Patient distracted by wrist restraints but once removed, able to sustain attention to am meal, self feeding with hands more efficiently then when using utensils. Oral phase functional with appropriate mastication of dysphagia 3 solids, independently taking small bites, and without overt s/s of aspiration. Thin liquids continue to elicit a consistent throat clear and/or cough response, suspect delayed swallow initiation. Based on conversation with daughter in law 3/13, this is chronic in nature secondary to CVA years ago. Patient has been without pulmonary symptoms to suggest an aspiration related infection however daughter reports a general decline in function over the past several months raising concern for decreased functional reserve to continue to avoid PNA related to decreased airway protection. Nectar thick liquids provided via straw consumed without complaints today and with significantly decreased signs of aspiration, one slight throat clear in 4 ounces. Discussed in length with daughter in law via phone. Unfortunately, patient likely to not participate in MBS at this time given current cognitive status with general refusal of pos (althout ate 100% today!). Options provided to continue thin liquids with known risk of aspiration vs a trials of nectar thick liquid to decrease aspiration risk. Daughter in law choosing to try thickened liquids at this time. Diet adjusted. Patient should be monitored closely for toleration as well as intake. If dehydration becomes a concern, will need to  reconsider thin liquids.    HPI HPI: 79 year old male SNF resident with hx prostate cancer s/p radiation in 05/2019, CVA and ?dementia who presented with concern for seizure and unresponsiveness at his SNF, intubated in ED but extubated same day.       SLP Plan  Continue with current plan of care       Recommendations  Diet recommendations: Dysphagia 3 (mechanical soft);Nectar-thick liquid Liquids provided via: Straw Medication Administration: Whole meds with puree Supervision: Patient able to self feed;Staff to assist with self feeding Compensations: Slow rate;Small sips/bites;Monitor for anterior loss Postural Changes and/or Swallow Maneuvers: Seated upright 90 degrees                Oral Care Recommendations: Oral care BID Follow up Recommendations: Skilled Nursing facility SLP Visit Diagnosis: Dysphagia, oropharyngeal phase (R13.12) Plan: Continue with current plan of care       GO              Jezreel Sisk MA, CCC-SLP    Evan Osburn Meryl 08/25/2019, 10:09 AM

## 2019-08-25 NOTE — Progress Notes (Signed)
Pt refused synthroid. [place] informed and asked to change per tube order (synthroid) to PO d.t pt having no feeding tube.

## 2019-08-25 NOTE — Plan of Care (Signed)

## 2019-08-25 NOTE — Evaluation (Signed)
Occupational Therapy Evaluation Patient Details Name: Willie [name] MRN: [id number] DOB: 05/08/1941 Today's Date: 08/25/2019    History of Present Illness 79 yo male admitted with acute met encephalopathy. He was found down, unresponsive at ALF. No evidence of seizure activity in neurology workup. Hx of dementia, prostate Ca, Sz, CVA, ETOH abuse.   Clinical Impression   This 79 y/o male presents with the above. PTA pt residing in ALF, was mod independent with ADL and functional mobility using RW/wheelchair. Focus of session on seated ADL today, pt up in chair with lunch tray arriving soon after session start. Pt completing self-feeding task at supervision/setup level, daughter-in-law present as well as reports pt hasn't eaten much since admission. Completed additional seated grooming ADL with setup/supervision and min cues to complete. Pt overall with UE weakness and noted intermittent bil tremors which subside during functional tasks. Pt mobilizing earlier today with RW and +1 assist. He will benefit from continued acute OT services and currently recommend post acute rehab services to maximize his safety and independence with ADL and mobility. Will follow.     Follow Up Recommendations  CIR;Supervision/Assistance - 24 hour    Equipment Recommendations  Other (comment)(TBD)           Precautions / Restrictions Precautions Precautions: Fall Restrictions Weight Bearing Restrictions: No      Mobility Bed Mobility Overal bed mobility: Needs Assistance Bed Mobility: Supine to Sit     Supine to sit: Min assist;HOB elevated     General bed mobility comments: OOB in recliner  Transfers Overall transfer level: Needs assistance Equipment used: Rolling walker (2 wheeled) Transfers: Sit to/from Stand Sit to Stand: Min guard         General transfer comment: pt needed minA for initial stand, then progressed to minG for remainder of transfers, benefits from VCs for hand placement.  5X STS in 60 sec    Balance Overall balance assessment: Needs assistance Sitting-balance support: No upper extremity supported;Feet supported Sitting balance-Leahy Scale: Good Sitting balance - Comments: supervision Postural control: Posterior lean   Standing balance-Leahy Scale: Fair Standing balance comment: pt able to stand without RW, but needs BUE support for ambulation. strong posterior lean with initial stand, improved with reps                           ADL either performed or assessed with clinical judgement   ADL Overall ADL's : Needs assistance/impaired Eating/Feeding: Set up;Supervision/ safety;Sitting Eating/Feeding Details (indicate cue type and reason): pt currently requires full supervision for meals, providing supervision for lunch during session Grooming: Wash/dry face;Set up;Supervision/safety;Sitting Grooming Details (indicate cue type and reason): seated in recliner                               General ADL Comments: pt with weakness, impaired cognition, and overall decrease in functional status. Focus of session on seated ADL including self feeding as daughter in law reports pt hasn't eaten much since admission      Vision Baseline Vision/History: Wears glasses Wears Glasses: At all times Additional Comments: pt with glasses that have R prism, has diplopia without glasses (daughter in law reports since previous CVA)     Perception     Praxis      Pertinent Vitals/Pain Pain Assessment: No/denies pain     Hand Dominance Right   Extremity/Trunk Assessment Upper Extremity Assessment Upper Extremity Assessment:  Generalized weakness(intermittent tremors noted)   Lower Extremity Assessment Lower Extremity Assessment: Defer to PT evaluation       Communication Communication Communication: HOH   Cognition Arousal/Alertness: Awake/alert Behavior During Therapy: WFL for tasks assessed/performed Overall Cognitive Status: History  of cognitive impairments - at baseline                                 General Comments: Daughter present and stating that the pt has deficits at baseline, does not report any new or different deficits.   General Comments       Exercises     Shoulder Instructions      Home Living Family/patient expects to be discharged to:: Assisted living                             Home Equipment: Wheelchair - manual;Walker - 2 wheels          Prior Functioning/Environment Level of Independence: Independent with assistive device(s)        Comments: transfers and ADL with mod independence        OT Problem List: Decreased strength;Decreased range of motion;Decreased activity tolerance;Impaired balance (sitting and/or standing);Decreased cognition;Decreased safety awareness;Decreased knowledge of use of DME or AE      OT Treatment/Interventions: Self-care/ADL training;Therapeutic exercise;Energy conservation;DME and/or AE instruction;Therapeutic activities;Patient/family education;Balance training;Cognitive remediation/compensation    OT Goals(Current goals can be found in the care plan section) Acute Rehab OT Goals Patient Stated Goal: none stated OT Goal Formulation: With patient Time For Goal Achievement: 09/08/19 Potential to Achieve Goals: Good  OT Frequency: Min 2X/week   Barriers to D/C:            Co-evaluation              AM-PAC OT "6 Clicks" Daily Activity     Outcome Measure Help from another person eating meals?: A Little Help from another person taking care of personal grooming?: A Little Help from another person toileting, which includes using toliet, bedpan, or urinal?: A Lot Help from another person bathing (including washing, rinsing, drying)?: A Lot Help from another person to put on and taking off regular upper body clothing?: A Lot Help from another person to put on and taking off regular lower body clothing?: A Lot 6 Click  Score: 14   End of Session Nurse Communication: Mobility status  Activity Tolerance: Patient tolerated treatment well Patient left: in chair;with call bell/phone within reach;with chair alarm set;with family/visitor present  OT Visit Diagnosis: Other abnormalities of gait and mobility (R26.89);Other symptoms and signs involving cognitive function;Muscle weakness (generalized) (M62.81)                Time: 6440-3474 OT Time Calculation (min): 15 min Charges:  OT General Charges $OT Visit: 1 Visit OT Evaluation $OT Eval Moderate Complexity: [place], OT Acute Rehabilitation Services Pager [phone] Office [phone]   Raymondo Band 08/25/2019, 5:21 PM

## 2019-08-25 NOTE — Care Management Important Message (Signed)
Important Message  Patient Details  Name: Willie [name] MRN: [id number] Date of Birth: 1940/10/16   Medicare Important Message Given:  Yes     Orbie Pyo 08/25/2019, 2:29 PM

## 2019-08-26 LAB — COMPREHENSIVE METABOLIC PANEL
ALT: 13 U/L (ref 0–44)
AST: 18 U/L (ref 15–41)
Albumin: 2.5 g/dL — ABNORMAL LOW (ref 3.5–5.0)
Alkaline Phosphatase: 51 U/L (ref 38–126)
Anion gap: 10 (ref 5–15)
BUN: 17 mg/dL (ref 8–23)
CO2: 27 mmol/L (ref 22–32)
Calcium: 8.5 mg/dL — ABNORMAL LOW (ref 8.9–10.3)
Chloride: 105 mmol/L (ref 98–111)
Creatinine, Ser: 0.93 mg/dL (ref 0.61–1.24)
GFR calc Af Amer: >60 mL/min (ref >60)
GFR calc non Af Amer: >60 mL/min (ref >60)
Glucose, Bld: 118 mg/dL — ABNORMAL HIGH (ref 70–99)
Potassium: 3.6 mmol/L (ref 3.5–5.1)
Sodium: 142 mmol/L (ref 135–145)
Total Bilirubin: 0.4 mg/dL (ref 0.3–1.2)
Total Protein: 5.8 g/dL — ABNORMAL LOW (ref 6.5–8.1)

## 2019-08-26 LAB — CBC WITH DIFFERENTIAL/PLATELET
Abs Immature Granulocytes: 0.02 10*3/uL (ref 0.00–0.07)
Basophils Absolute: 0 10*3/uL (ref 0.0–0.1)
Basophils Relative: 1 %
Eosinophils Absolute: 0.1 10*3/uL (ref 0.0–0.5)
Eosinophils Relative: 2 %
HCT: 31.8 % — ABNORMAL LOW (ref 39.0–52.0)
Hemoglobin: 10.5 g/dL — ABNORMAL LOW (ref 13.0–17.0)
Immature Granulocytes: 0 %
Lymphocytes Relative: 35 %
Lymphs Abs: 2.1 10*3/uL (ref 0.7–4.0)
MCH: 31 pg (ref 26.0–34.0)
MCHC: 33 g/dL (ref 30.0–36.0)
MCV: 93.8 fL (ref 80.0–100.0)
Monocytes Absolute: 0.4 10*3/uL (ref 0.1–1.0)
Monocytes Relative: 7 %
Neutro Abs: 3.4 10*3/uL (ref 1.7–7.7)
Neutrophils Relative %: 55 %
Platelets: 234 10*3/uL (ref 150–400)
RBC: 3.39 MIL/uL — ABNORMAL LOW (ref 4.22–5.81)
RDW: 12 % (ref 11.5–15.5)
WBC: 6.1 10*3/uL (ref 4.0–10.5)
nRBC: 0 % (ref 0.0–0.2)

## 2019-08-26 LAB — GLUCOSE, CAPILLARY
Glucose-Capillary: 140 mg/dL — ABNORMAL HIGH (ref 70–99)
Glucose-Capillary: 140 mg/dL — ABNORMAL HIGH (ref 70–99)
Glucose-Capillary: 166 mg/dL — ABNORMAL HIGH (ref 70–99)
Glucose-Capillary: 225 mg/dL — ABNORMAL HIGH (ref 70–99)
Glucose-Capillary: 85 mg/dL (ref 70–99)
Glucose-Capillary: 99 mg/dL (ref 70–99)

## 2019-08-26 MED ORDER — NAMZARIC 28-10 MG PO CP24: 1.0000 | ORAL_CAPSULE | Freq: Every day | ORAL | 0 refills | Status: DC

## 2019-08-26 MED ORDER — PANTOPRAZOLE SODIUM 40 MG PO TBEC: 40.0000 mg | DELAYED_RELEASE_TABLET | Freq: Every day | ORAL | Status: DC

## 2019-08-26 MED ORDER — METOPROLOL SUCCINATE ER 25 MG PO TB24: 12.5000 mg | ORAL_TABLET | Freq: Every day | ORAL | Status: DC

## 2019-08-26 MED ORDER — TRAZODONE HCL 100 MG PO TABS: 100.0000 mg | ORAL_TABLET | Freq: Every day | ORAL | 0 refills | Status: DC

## 2019-08-26 NOTE — Progress Notes (Signed)
Patient's blood glucose was 225 at 1518. Patient had just eaten spaghetti. Will continue with glucose checks as ordered.

## 2019-08-26 NOTE — NC FL2 (Signed)
[place] LEVEL OF CARE SCREENING TOOL     IDENTIFICATION  Patient Name: Willie [name] Birthdate: 1940-12-08 Sex: male Admission Date (Current Location): 08/22/2019  [facility] and [place] Number:  Herbalist and Address:  [facility] [place] [address] [place], [place] [address]      Provider Number: [id number]  Attending Physician Name and Address:  Nita Sells, MD  Relative Name and Phone Number:       Current Level of Care: Hospital Recommended Level of Care: [place] Prior Approval Number:    Date Approved/Denied:   PASRR Number: [id number]  Discharge Plan: SNF    Current Diagnoses: Patient Active Problem List   Diagnosis Date Noted  . Malignant neoplasm of prostate ([place]) 01/21/2019  . Essential hypertension 01/01/2019  . Generalized weakness 01/01/2019  . Acute metabolic encephalopathy [id number]  . Controlled type 2 diabetes mellitus with hypoglycemia ([place]) 12/31/2018  . Hypothyroidism 12/31/2018  . Normocytic normochromic anemia 12/31/2018  . Hyponatremia 12/31/2018    Orientation RESPIRATION BLADDER Height & Weight     Self, Time, Situation, Place  Normal Incontinent Weight: 137 lb 5.6 oz (62.3 kg) Height:  5\' 11"  (180.3 cm)  BEHAVIORAL SYMPTOMS/MOOD NEUROLOGICAL BOWEL NUTRITION STATUS      Incontinent Diet(See discharge summery)  AMBULATORY STATUS COMMUNICATION OF NEEDS Skin   Extensive Assist Verbally Normal                       Personal Care Assistance Level of Assistance  Bathing, Feeding, Dressing Bathing Assistance: Maximum assistance Feeding assistance: Maximum assistance Dressing Assistance: Maximum assistance     Functional Limitations Info             SPECIAL CARE FACTORS FREQUENCY  PT (By licensed PT), OT (By licensed OT)     PT Frequency: 5x a week OT Frequency: 5x a week            Contractures Contractures Info: Not present    Additional  Factors Info  Code Status, Allergies Code Status Info: full Allergies Info: Asprin           Current Medications (08/26/2019):  This is the current hospital active medication list Current Facility-Administered Medications  Medication Dose Route Frequency Provider Last Rate Last Admin  . atorvastatin (LIPITOR) tablet 10 mg  10 mg Oral Daily Donnamae Jude, RPH   10 mg at 08/26/19 0931  . baclofen (LIORESAL) tablet 10 mg  10 mg Oral TID Nita Sells, MD   10 mg at 08/26/19 0931  . darifenacin (ENABLEX) 24 hr tablet 7.5 mg  7.5 mg Oral Daily Nita Sells, MD   7.5 mg at 08/26/19 0930  . feeding supplement (GLUCERNA SHAKE) (GLUCERNA SHAKE) liquid 237 mL  237 mL Oral BID BM Nita Sells, MD   237 mL at 08/26/19 0932  . heparin injection 5,000 Units  5,000 Units Subcutaneous Q8H Spero Geralds, MD   5,000 Units at 08/26/19 0615  . levETIRAcetam (KEPPRA) tablet 250 mg  250 mg Oral BID Kerney Elbe, MD   250 mg at 08/26/19 0931  . levothyroxine (SYNTHROID) tablet 137 mcg  137 mcg Oral Q0600 Donnamae Jude, [facility]   137 mcg at 08/26/19 [id number]  . MEDLINE mouth rinse  15 mL Mouth Rinse BID Nita Sells, MD   15 mL at 08/26/19 0932  . metoprolol succinate (TOPROL-XL) 24 hr tablet 12.5 mg  12.5 mg Oral Daily Nita Sells, MD  12.5 mg at 08/26/19 0931  . naphazoline-glycerin (CLEAR EYES REDNESS) ophth solution 1 drop  1 drop Both Eyes Q2H PRN Margaretha Seeds, MD   1 drop at 08/24/19 1719  . pantoprazole (PROTONIX) EC tablet 40 mg  40 mg Oral QHS Donnamae Jude, RPH   40 mg at 08/25/19 2022  . polyethylene glycol (MIRALAX / GLYCOLAX) packet 17 g  17 g Oral Daily PRN Nita Sells, MD         Discharge Medications: Please see discharge summary for a list of discharge medications.  Relevant Imaging Results:  Relevant Lab Results:   Additional Information ssn: [id number]  Emeterio Reeve, [place]

## 2019-08-26 NOTE — TOC Progression Note (Signed)
Transition of Care [facility]) - Progression Note    Patient Details  Name: Willie [name] MRN: [id number] Date of Birth: [birth year]  Transition of Care [facility]) CM/SW [place], [place] Phone Number: [phone] 08/26/2019, 10:36 AM  Clinical Narrative:     CSW spoke to [place] via phone to discuss SNF vs returning to [facility] with 24 hour supervision and therapies. [name]  Stated she was unsure about what they want ted to be but stated if he does go to SNF she would want pt to go to [facility]. [name] stated she has already spoken to [facility] and they have a bed. CSW will send out referrals.   [name] stated she was considering if he will need memory care at [facility] due to possible Dementia. CSW recommended she talk to [facility] and let them make that recommendation when he returns to the facility. CSW will continue to follow up with [name].    Expected Discharge Plan: [place] Barriers to Discharge: Continued Medical Work up  Expected Discharge Plan and Services Expected Discharge Plan: [place] arrangements for the past 2 months: [place]                                       Social Determinants of Health (SDOH) Interventions    Readmission Risk Interventions No flowsheet data found.  Emeterio Reeve, Latanya Presser, [place] Licensed Clinical Social Worker [phone]

## 2019-08-26 NOTE — Progress Notes (Signed)
Physical Therapy Treatment Patient Details Name: Willie [name] MRN: [id number] DOB: [birth year] Today's Date: 08/26/2019    History of Present Illness 79 yo male admitted with acute met encephalopathy. He was found down, unresponsive at ALF. No evidence of seizure activity in neurology workup. Hx of dementia, prostate Ca, Sz, CVA, ETOH abuse.    PT Comments    Pt progressing with ambulation distance, 200' with RW and min A, one LOB with change of directions, min A to correct. Pt very fearful of falling and avoided ambulation PTA in favor of w/c. Would benefit from post acute rehab in SNF setting to improve safety with ambulation. PT will continue to follow.    Follow Up Recommendations  Supervision/Assistance - 24 hour;SNF     Equipment Recommendations  Rolling walker with 5" wheels    Recommendations for Other Services       Precautions / Restrictions Precautions Precautions: Fall Precaution Comments: pt very fearful of falling so uses w/c for primary mobility Restrictions Weight Bearing Restrictions: No    Mobility  Bed Mobility Overal bed mobility: Needs Assistance Bed Mobility: Supine to Sit     Supine to sit: Min assist;HOB elevated Sit to supine: Supervision   General bed mobility comments: pt reports that he cannot get up by himself but when cued was able to reach across self to grasp rail and come to SL with LE's off bed, min HHA for last 10% of trunk elevation. Supervision for return to supine  Transfers Overall transfer level: Needs assistance Equipment used: Rolling walker (2 wheeled) Transfers: Sit to/from Stand Sit to Stand: Mod assist         General transfer comment: mod A for fwd wt shift and power up. vc's for placement of hands on grip of RW once standing  Ambulation/Gait Ambulation/Gait assistance: Min assist Gait Distance (Feet): 200 Feet Assistive device: Rolling walker (2 wheeled) Gait Pattern/deviations: Step-through pattern;Decreased  stride length;Narrow base of support;Trunk flexed Gait velocity: decreased, pt able to increase to "faster" pace but is unsafe to maintain due to vision deficits and poor reactive stability Gait velocity interpretation: <1.8 ft/sec, indicate of risk for recurrent falls General Gait Details: vc's for erect posture several times. Pt able to avoid obstacles but LOB noted with turning into room, min A to correct.    Stairs             Wheelchair Mobility    Modified Rankin (Stroke Patients Only)       Balance Overall balance assessment: Needs assistance Sitting-balance support: No upper extremity supported;Feet supported Sitting balance-Leahy Scale: Good     Standing balance support: Bilateral upper extremity supported Standing balance-Leahy Scale: Poor Standing balance comment: able to stand at sink and remove dentures with both hands but needs min A for safety when not holding onto stable surface                            Cognition Arousal/Alertness: Awake/alert Behavior During Therapy: WFL for tasks assessed/performed Overall Cognitive Status: History of cognitive impairments - at baseline                                        Exercises      General Comments General comments (skin integrity, edema, etc.): HR 105 bpm with ambulation      Pertinent Vitals/Pain Pain Assessment: No/denies  pain Faces Pain Scale: No hurt    Home Living                      Prior Function            PT Goals (current goals can now be found in the care plan section) Acute Rehab PT Goals Patient Stated Goal: none stated PT Goal Formulation: With patient Time For Goal Achievement: 09/07/19 Potential to Achieve Goals: Fair Progress towards PT goals: Progressing toward goals    Frequency    Min 2X/week      PT Plan Discharge plan needs to be updated;Frequency needs to be updated    Co-evaluation              AM-PAC PT "6  Clicks" Mobility   Outcome Measure  Help needed turning from your back to your side while in a flat bed without using bedrails?: A Little Help needed moving from lying on your back to sitting on the side of a flat bed without using bedrails?: A Little Help needed moving to and from a bed to a chair (including a wheelchair)?: A Lot Help needed standing up from a chair using your arms (e.g., wheelchair or bedside chair)?: A Lot Help needed to walk in hospital room?: A Little Help needed climbing 3-5 steps with a railing? : A Lot 6 Click Score: 15    End of Session Equipment Utilized During Treatment: Gait belt Activity Tolerance: Patient tolerated treatment well Patient left: with call bell/phone within reach;in bed;with bed alarm set Nurse Communication: Mobility status PT Visit Diagnosis: Muscle weakness (generalized) (M62.81);Other abnormalities of gait and mobility (R26.89);Hemiplegia and hemiparesis Hemiplegia - Right/Left: Right Hemiplegia - dominant/non-dominant: Dominant Hemiplegia - caused by: Cerebral infarction     Time: 6144-3154 PT Time Calculation (min) (ACUTE ONLY): 17 min  Charges:  $Gait Training: 8-22 mins                     Leighton Roach, [place]  Pager [phone] Office [place] 08/26/2019, 3:11 PM

## 2019-08-26 NOTE — Discharge Summary (Signed)
Physician Discharge Summary  Willie [name] YOV:[id number] DOB: 09-28-1940 DOA: 08/22/2019  PCP: Josetta Huddle, MD  Admit date: 08/22/2019 Discharge date: 08/26/2019  Time spent: 45 minutes  Recommendations for Outpatient Follow-up:  1. Needs outpatient follow-up with neurology continue prior Keppra 250 twice daily 2. Recommend Chem-12 CBC 1 week 3. Recommend screening for dementia with [place] or MMSE 4. 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  Hand gripping during episodes unclear if actual seizures On admission AKI 38/1.44 baseline 1.08, lactic acidosis 3.5, white count 4.6, CT head no acute findings-periventricular hypodensities chronic ischemia MRI no acute infarct CXR no acute cardiopulmonary findings After MRI was following commands and was immediately able to be extubated  Hospital Course:  EEG 3/12 suggestive of mild diffuse encephalopathy nonspecific  Assessment & Plan:  Seizure like activity vs tremor vs  provoked from hypoglycemia LTEEG nonsuggestive epileptic seizures transitioned back to [place] 250 twice daily Will need outpatient follow-up with neurologist if recurrence occurs and can uptitrate dose of Keppra if there is a need DM TY 2 with type B lactic acidosis Mild hypoglycemia in the 80s would not cover with any diabetic medications on discharge low 100-consider supplements in the outpatient setting and or initiation of steroids to help boost blood sugars Have discontinued Metformin even though this does not typically cause hypoglycemia Dysphagia Cleared by SLP for dys 3 diet--follow recs as progresses Hypoxic respiratory failure Extubated quickly--no o2 requirement AKI hypomagnesemia Likely pre-renal ATN--improved--saline lock-magnesium improved to 1.8 on the 14th no further tracking Prostate cancer stg 1 c s/p XRT completion 06/11/2019 OP further follow up with Urology as indicated Is on both Myrbetriq and Vesicare may need to discontinue one of them in the outpatient setting if no need for dual therapy given anticholinergic activity HTN Irbesartan 75 held on admit--resuming Metoprolol XL  At lower dose 12.5 on discharge Vascular dementia + delirium at times prior EtOH Confusion and seems to sundown in the afternoons-redirected and patient resolved without any real issues restraints were kept temporarily but quickly removed and he was more coherent on discharge Can continue Namzaric 28-10 Can continue baclofen may need lower dose  Procedures:  Long-term EEG was negative as above  Consultations:  Neurology  Discharge Exam: Vitals:   08/26/19 0753 08/26/19 1122  BP: (!) 152/74 115/67  Pulse: 81 96  Resp: 12 10  Temp: 98 F (36.7 C) 98.6 F (37 C)  SpO2: 97% 98%    General: EOMI NCAT no focal deficit Cardiovascular: S1-S2 no murmur rub or gallop seems to be in sinus on monitor Respiratory: Clinically clear  no added sound EOMI NCAT no focal deficit Neurologically  intact moving all 4 limbs more coherent  Discharge Instructions   Discharge Instructions    Diet - low sodium heart healthy   Complete by: As directed    Increase activity slowly   Complete by: As directed      Allergies as of 08/26/2019      Reactions   Aspirin Other (See Comments)   Had stroke in March last year. No more aspirin. PER MAR - pt has no known allergies       Medication List    STOP taking these medications   bisacodyl 5 MG EC tablet Commonly known as: DULCOLAX   irbesartan 75 MG tablet Commonly known as: AVAPRO   loperamide 2 MG tablet Commonly known as: IMODIUM A-D   magnesium citrate Soln   Melatonin 1 MG Tabs   OneTouch Delica Plus [id number] Misc   OneTouch Verio test strip Generic drug: glucose blood   OneTouch Verio w/Device Kit   sennosides-docusate sodium 8.6-50 MG tablet Commonly known as: SENOKOT-S     TAKE these medications   acetaminophen 325 MG tablet Commonly known as: TYLENOL Take 650 mg by mouth every 6 (six) hours as needed for moderate pain.   atorvastatin 10 MG tablet Commonly known as: LIPITOR Take 10 mg by mouth daily.   baclofen 10 MG tablet Commonly known as: LIORESAL Take 10 mg by mouth 3 (three) times daily.   Ex-Lax 15 MG Chew Generic drug: Sennosides Chew 1 tablet by mouth 2 (two) times daily as needed (constipation).   levETIRAcetam 250 MG tablet Commonly known as: KEPPRA Take 250 mg by mouth 2 (two) times daily.   levothyroxine 137 MCG tablet Commonly known as: SYNTHROID Take 137 mcg by mouth daily before breakfast.   metFORMIN 500 MG tablet Commonly known as: GLUCOPHAGE Take 500 mg by mouth daily with breakfast.   metoprolol succinate 25 MG 24 hr tablet Commonly known as: TOPROL-XL Take 25 mg by mouth daily. Take with or immediately following a meal.   Myrbetriq 50 MG Tb24 tablet Generic drug: mirabegron ER Take 50 mg by mouth daily.   Namzaric 28-10 MG Cp24 Generic drug: Memantine  HCl-Donepezil HCl Take 1 capsule by mouth daily.   pantoprazole 40 MG tablet Commonly known as: PROTONIX Take 1 tablet (40 mg total) by mouth at bedtime.   polyethylene glycol 17 g packet Commonly known as: MIRALAX / GLYCOLAX Take 17 g by mouth daily as needed for moderate constipation.   solifenacin 10 MG tablet Commonly known as: VESICARE Take 10 mg by mouth daily.   traZODone 100 MG tablet Commonly known as: DESYREL Take 1 tablet (100 mg total) by mouth at bedtime.      Allergies  Allergen Reactions  . Aspirin Other (See Comments)    Had stroke in March last year. No more aspirin.  PER MAR - pt has no known allergies       The results of significant diagnostics from this hospitalization (including imaging, microbiology, ancillary and laboratory) are listed below for reference.    Significant Diagnostic Studies: CT HEAD WO CONTRAST  Result Date: 08/22/2019 CLINICAL DATA:  Unresponsive patient EXAM: CT HEAD WITHOUT CONTRAST TECHNIQUE: Contiguous axial images were obtained from the base of the skull through the vertex without intravenous contrast. COMPARISON:  07/22/2019 FINDINGS: Brain: A The brainstem, cerebellum, cerebral peduncles, thalami, basal ganglia, basilar cisterns, and ventricular system appear within normal limits. Periventricular white matter and corona radiata hypodensities favor  chronic ischemic microvascular white matter disease. No intracranial hemorrhage, mass lesion, or acute CVA. Vascular: There is atherosclerotic calcification of the cavernous carotid arteries bilaterally. Skull: Unremarkable Sinuses/Orbits: Chronic ethmoid sinusitis. Other: No supplemental non-categorized findings. IMPRESSION: 1. No acute intracranial findings. 2. Periventricular white matter and corona radiata hypodensities favor chronic ischemic microvascular white matter disease. 3. Chronic ethmoid sinusitis. Electronically Signed   By: Van Clines M.D.   On: 08/22/2019 08:40    MR BRAIN WO CONTRAST  Result Date: 08/22/2019 CLINICAL DATA:  Possible seizures EXAM: MRI HEAD WITHOUT CONTRAST TECHNIQUE: Multiplanar, multiecho pulse sequences of the brain and surrounding structures were obtained without intravenous contrast. COMPARISON:  2011 FINDINGS: Brain: There is no acute infarction or intracranial hemorrhage. There is no intracranial mass, mass effect, or edema. There is no hydrocephalus or extra-axial fluid collection. Patchy T2 hyperintensity in the supratentorial and pontine white matter is nonspecific but may reflect mild to moderate chronic microvascular ischemic changes. Prominence of the ventricles and sulci reflects generalized parenchymal volume loss. Relatively disproportionate ventricular prominence is favored to reflect central volume loss rather than hydrocephalus. This is not substantially progressed since 2011. Vascular: Major vessel flow voids at the skull base are preserved. Skull and upper cervical spine: Normal marrow signal is preserved. Sinuses/Orbits: Minor mucosal thickening.  Orbits are unremarkable. Other: Sella is unremarkable.  Trace mastoid fluid opacification. IMPRESSION: No acute infarction, hemorrhage, or mass. Chronic microvascular ischemic changes. Electronically Signed   By: Macy Mis M.D.   On: 08/22/2019 15:25   DG Chest Port 1 View  Result Date: 08/22/2019 CLINICAL DATA:  Found unresponsive. EXAM: PORTABLE CHEST 1 VIEW COMPARISON:  07/16/2016 FINDINGS: The endotracheal tube is 3 cm above the carina. The cardiac silhouette, mediastinal and hilar contours are within normal limits and stable. Moderate calcification of the thoracic aorta. The lungs are clear. No pleural effusions or pneumothorax. The bony thorax is intact. IMPRESSION: 1. Endotracheal tube in good position. 2. No acute cardiopulmonary findings. Electronically Signed   By: Marijo Sanes M.D.   On: 08/22/2019 07:56   EEG adult  Result Date: 08/22/2019 Lora Havens, MD      08/22/2019 12:00 PM Patient Name: Willie [name] MRN: [id number] Epilepsy Attending: Lora Havens Referring Physician/Provider: Laurey Morale, NP Date: 08/22/2019 Duration: 26.08 minutes Patient history: 79 year old male who was last seen in ED for shaking and worsening cognition in the setting of dementia who presented again with altered mental status and concern for seizures.  EEG to evaluate for seizures. Level of alertness: Awake AEDs during EEG study: Versed Technical aspects: This EEG study was done with scalp electrodes positioned according to the 10-20 International system of electrode placement. Electrical activity was acquired at a sampling rate of '500Hz'  and reviewed with a high frequency filter of '70Hz'  and a low frequency filter of '1Hz' . EEG data were recorded continuously and digitally stored. Description: During awake state, no clear posterior dominant rhythm was seen.  EEG showed continuous generalized 5 to 7 Hz theta activity.  Hyperventilation photic stimulation were not performed. Abnormality -Continuous slow, generalized IMPRESSION: This study is suggestive of mild to moderate diffuse encephalopathy, nonspecific to etiology.  No seizures or epileptiform discharges were seen throughout the recording. Priyanka [name]   Overnight EEG with video  Result Date: 08/24/2019 Lora Havens, MD     08/25/2019  8:44 AM Patient Name: Willie [name] MRN: [id number] Epilepsy Attending: Lora Havens Referring Physician/Provider: Dr Kerney Elbe Duration: 08/23/2019 7829 to /314/2021 0908  Total study duration: 24 hours  Patient history: 79 year old male who was last seen in ED for shaking and worsening cognition in the setting of dementia who presented again with altered mental status and concern for seizures.  EEG to evaluate for seizures.  Level of alertness: Awake, asleep  AEDs during EEG study: PHT, LEV  Technical aspects: This EEG study was done with scalp electrodes positioned according to  the 10-20 International system of electrode placement. Electrical activity was acquired at a sampling rate of '500Hz'  and reviewed with a high frequency filter of '70Hz'  and a low frequency filter of '1Hz' . EEG data were recorded continuously and digitally stored.  Description:  The posterior dominant rhythm consists of 8-9 Hz activity of moderate voltage (25-35 uV) seen predominantly in posterior head regions, symmetric and reactive to eye opening and eye closing. Sleep was characterized by vertex waves, sleep spindles (12-'14hz' ), maximal frontocentral.         Multiple events were captured on 08/23/2019 at 1008, 1245, 1725 and 1748 during which patient was noted to have generalized non rhythmic whole body twitching. Concomitant eeg before, during and after the event did not show any eeg change to suggest seizure.  IMPRESSION: This study is within normal limits. No seizures or epileptiform discharges were seen throughout the recording. Multiple events were captured as described above during which patient was noted to have generalized non rhythmic whole body twitching without concomitant eeg change. These events were most likely non epileptic events. Lora Havens    Microbiology: Recent Results (from the past 240 hour(s))  Blood Cultures (routine x 2)     Status: None (Preliminary result)   Collection Time: 08/22/19  7:35 AM   Specimen: BLOOD  Result Value Ref Range Status   Specimen Description BLOOD LEFT ANTECUBITAL  Final   Special Requests   Final    BOTTLES DRAWN AEROBIC AND ANAEROBIC Blood Culture adequate volume   Culture   Final    NO GROWTH 4 DAYS Performed at [facility], [address], [place], [place] [address]    Report Status PENDING  Incomplete  Respiratory Panel by RT PCR (Flu A&B, Covid) - Nasopharyngeal Swab     Status: None   Collection Time: 08/22/19  7:41 AM   Specimen: Nasopharyngeal Swab  Result Value Ref Range Status   SARS Coronavirus 2 by RT PCR NEGATIVE NEGATIVE  Final    Comment: (NOTE) SARS-CoV-2 target nucleic acids are NOT DETECTED. The SARS-CoV-2 RNA is generally detectable in upper respiratoy specimens during the acute phase of infection. The lowest concentration of SARS-CoV-2 viral copies this assay can detect is 131 copies/mL. A negative result does not preclude SARS-Cov-2 infection and should not be used as the sole basis for treatment or other patient management decisions. A negative result may occur with  improper specimen collection/handling, submission of specimen other than nasopharyngeal swab, presence of viral mutation(s) within the areas targeted by this assay, and inadequate number of viral copies (<131 copies/mL). A negative result must be combined with clinical observations, patient history, and epidemiological information. The expected result is Negative. Fact Sheet for Patients:  PinkCheek.be Fact Sheet for Healthcare Providers:  GravelBags.it This test is not yet ap proved or cleared by the [place] FDA and  has been authorized for detection and/or diagnosis of SARS-CoV-2 by FDA under an Emergency Use Authorization (EUA). This EUA will remain  in effect (meaning this test can be used) for the duration of the COVID-19 declaration under  Section 564(b)(1) of the Act, 21 U.S.C. section 360bbb-3(b)(1), unless the authorization is terminated or revoked sooner.    Influenza A by PCR NEGATIVE NEGATIVE Final   Influenza B by PCR NEGATIVE NEGATIVE Final    Comment: (NOTE) The Xpert Xpress SARS-CoV-2/FLU/RSV assay is intended as an aid in  the diagnosis of influenza from Nasopharyngeal swab specimens and  should not be used as a sole basis for treatment. Nasal washings and  aspirates are unacceptable for Xpert Xpress SARS-CoV-2/FLU/RSV  testing. Fact Sheet for Patients: PinkCheek.be Fact Sheet for Healthcare  Providers: GravelBags.it This test is not yet approved or cleared by the [place] FDA and  has been authorized for detection and/or diagnosis of SARS-CoV-2 by  FDA under an Emergency Use Authorization (EUA). This EUA will remain  in effect (meaning this test can be used) for the duration of the  Covid-19 declaration under Section 564(b)(1) of the Act, 21  U.S.C. section 360bbb-3(b)(1), unless the authorization is  terminated or revoked. Performed at [facility], [place] [address] [place], [place] [address]   Blood Cultures (routine x 2)     Status: None (Preliminary result)   Collection Time: 08/22/19  7:52 AM   Specimen: BLOOD LEFT FOREARM  Result Value Ref Range Status   Specimen Description BLOOD LEFT FOREARM  Final   Special Requests   Final    BOTTLES DRAWN AEROBIC AND ANAEROBIC Blood Culture results may not be optimal due to an inadequate volume of blood received in culture bottles   Culture   Final    NO GROWTH 4 DAYS Performed at [facility], [place] [address], [place], [place] [address]    Report Status PENDING  Incomplete  Urine culture     Status: None   Collection Time: 08/22/19 11:02 AM   Specimen: Urine, Catheterized  Result Value Ref Range Status   Specimen Description URINE, CATHETERIZED  Final   Special Requests NONE  Final   Culture   Final    NO GROWTH Performed at [facility], [address] [place], [place] [address]    Report Status 08/23/2019 FINAL  Final  MRSA PCR Screening     Status: None   Collection Time: 08/22/19 12:17 PM   Specimen: Nasal Mucosa; Nasopharyngeal  Result Value Ref Range Status   MRSA by PCR NEGATIVE NEGATIVE Final    Comment:        The GeneXpert MRSA Assay (FDA approved for NASAL specimens only), is one component of a comprehensive MRSA colonization surveillance program. It is not intended to diagnose MRSA infection nor to guide or monitor treatment for MRSA  infections. Performed at [facility], [place] [address] [place], [place] [address]   SARS CORONAVIRUS 2 (TAT 6-24 HRS) Nasopharyngeal Nasopharyngeal Swab     Status: None   Collection Time: 08/25/19 11:53 AM   Specimen: Nasopharyngeal Swab  Result Value Ref Range Status   SARS Coronavirus 2 NEGATIVE NEGATIVE Final    Comment: (NOTE) SARS-CoV-2 target nucleic acids are NOT DETECTED. The SARS-CoV-2 RNA is generally detectable in upper and lower respiratory specimens during the acute phase of infection. Negative results do not preclude SARS-CoV-2 infection, do not rule out co-infections with other pathogens, and should not be used as the sole basis for treatment or other patient management decisions. Negative results must be combined with clinical observations, patient history, and epidemiological information. The expected result is Negative. Fact Sheet for Patients: SugarRoll.be Fact Sheet for Healthcare  Providers: https://www.woods-mathews.com/ This test is not yet approved or cleared by the [place] and  has been authorized for detection and/or diagnosis of SARS-CoV-2 by FDA under an Emergency Use Authorization (EUA). This EUA will remain  in effect (meaning this test can be used) for the duration of the COVID-19 declaration under Section 56 4(b)(1) of the Act, 21 U.S.C. section 360bbb-3(b)(1), unless the authorization is terminated or revoked sooner. Performed at [facility], [place] [address] [place], [place] [address]      Labs: Basic Metabolic Panel: Recent Labs  Lab 08/22/19 0726 08/22/19 0726 08/22/19 0746 08/22/19 1105 08/23/19 0410 08/24/19 0936 08/25/19 0427 08/26/19 0401  NA 141   < > 143  --  142 142 143 142  K 4.1   < > 4.0  --  3.9 4.0 3.8 3.6  CL 105  --   --   --  105 106 106 105  CO2 25  --   --   --  '24 22 23 27  ' GLUCOSE 140*  --   --   --  86 93 69* 118*  BUN 38*  --   --   --  '22 16 15 17   ' CREATININE 1.44*   < >  --  1.49* 1.21 1.16 1.18 0.93  CALCIUM 9.6  --   --   --  9.0 8.5* 8.5* 8.5*  MG  --   --   --   --  1.2* 1.8  --   --   PHOS  --   --   --   --  3.3  --  2.4*  --    < > = values in this interval not displayed.   Liver Function Tests: Recent Labs  Lab 08/22/19 0726 08/24/19 0936 08/25/19 0427 08/26/19 0401  AST 17 16  --  18  ALT 12 10  --  13  ALKPHOS 52 45  --  51  BILITOT 0.7 1.0  --  0.4  PROT 6.5 6.0*  --  5.8*  ALBUMIN 3.4* 2.9* 2.8* 2.5*   No results for input(s): LIPASE, AMYLASE in the last 168 hours. Recent Labs  Lab 08/22/19 0726  AMMONIA 31   CBC: Recent Labs  Lab 08/22/19 0726 08/22/19 0746 08/22/19 1105 08/23/19 0410 08/24/19 0936 08/25/19 0427 08/26/19 0401  WBC 4.6   < > 7.1 12.2* 11.0* 8.4 6.1  NEUTROABS 2.2  --   --   --  8.1* 5.7 3.4  HGB 12.0*   < > 11.4* 11.3* 10.7* 10.9* 10.5*  HCT 37.0*   < > 34.5* 34.7* 32.4* 32.8* 31.8*  MCV 95.9   < > 95.6 95.3 92.6 94.0 93.8  PLT 237   < > 239 249 215 219 234   < > = values in this interval not displayed.   Cardiac Enzymes: No results for input(s): CKTOTAL, CKMB, CKMBINDEX, TROPONINI in the last 168 hours. BNP: BNP (last 3 results) No results for input(s): BNP in the last 8760 hours.  ProBNP (last 3 results) No results for input(s): PROBNP in the last 8760 hours.  CBG: Recent Labs  Lab 08/25/19 2135 08/26/19 0005 08/26/19 0431 08/26/19 0747 08/26/19 1118  GLUCAP 229* 166* 99 85 140*       Signed:  Nita Sells MD   [facility] 08/26/2019, 11:37 AM

## 2019-08-27 LAB — GLUCOSE, CAPILLARY
Glucose-Capillary: 111 mg/dL — ABNORMAL HIGH (ref 70–99)
Glucose-Capillary: 97 mg/dL (ref 70–99)

## 2019-08-27 LAB — CULTURE, BLOOD (ROUTINE X 2)
Culture: NO GROWTH
Culture: NO GROWTH
Special Requests: ADEQUATE

## 2019-08-27 NOTE — Plan of Care (Signed)

## 2019-08-27 NOTE — Progress Notes (Signed)
Patient discharged to SNF yesterday (3/16) but ended up staying.  No major events overnight or this morning.  Patient remains a stable.  Oriented x4 except to date.  Some dysarthria.  Otherwise, physical exam benign.  Discharge summary from 08/26/2019 by Dr. Verlon Au stands.

## 2019-08-27 NOTE — Progress Notes (Signed)
Discharged to [facility] after report called to nurse [facility].  Did have his eye glasses and his upper and lower dentures in when he discharged.  Was transported by his daughter.

## 2019-08-27 NOTE — TOC Transition Note (Signed)
Transition of Care [facility]) - CM/SW Discharge Note   Patient Details  Name: Willie [name] MRN: [id number] Date of Birth: 05-May-1941  Transition of Care [facility]) CM/SW Contact:  Willie [name], [place] Phone Number:[phone] 08/27/2019, 11:12 AM   Clinical Narrative:     Nurse to call report to [phone]. Family will transport to facility.    Final next level of care: Skilled Nursing Facility Barriers to Discharge: Barriers Resolved   Patient Goals and CMS Choice Patient states their goals for this hospitalization and ongoing recovery are:: to return back to assited living CMS Medicare.gov Compare Post Acute Care list provided to:: Patient Represenative (must comment)    Discharge Placement              Patient chooses bed at: [facility] Patient to be transferred to facility by: Family Name of family member notified: Willie [name] Patient and family notified of of transfer: 08/27/19  Discharge Plan and Services                                     Social Determinants of Health (SDOH) Interventions     Readmission Risk Interventions No flowsheet data found.  Willie [name], Latanya Presser, [place] Licensed Clinical Social Worker [phone]

## 2019-09-08 ENCOUNTER — Encounter: Payer: Self-pay | Admitting: Neurology

## 2019-09-08 ENCOUNTER — Other Ambulatory Visit: Payer: Self-pay

## 2019-09-08 ENCOUNTER — Ambulatory Visit: Payer: Medicare Other | Admitting: Neurology

## 2019-09-08 VITALS — BP 72/46 | HR 79 | Ht 72.0 in | Wt 132.6 lb

## 2019-09-08 DIAGNOSIS — F01518 Vascular dementia, unspecified severity, with other behavioral disturbance: Secondary | ICD-10-CM

## 2019-09-08 DIAGNOSIS — F445 Conversion disorder with seizures or convulsions: Secondary | ICD-10-CM | POA: Diagnosis not present

## 2019-09-08 DIAGNOSIS — F0151 Vascular dementia with behavioral disturbance: Secondary | ICD-10-CM

## 2019-09-08 NOTE — Patient Instructions (Addendum)
1. Start Lexapro 10mg  daily once back at [facility]  2. Continue all your medications for now  3. Continue close supervision  4. Follow-up in 4-5 months, call for any changes

## 2019-09-08 NOTE — Progress Notes (Signed)
NEUROLOGY CONSULTATION NOTE  Willie [name] MRN: [id number] DOB: 1940-10-27  Referring provider: Dr. Josetta [name]  Primary care provider: Dr. Josetta [name]  Reason for consult:  seizure  Dear Dr Inda Merlin:  Thank you for your kind referral of Willie [name] for consultation of the above symptoms. Although his history is well known to you, please allow me to reiterate it for the purpose of our medical record. The patient was accompanied to the clinic by his daughter-in-law Willie [name] who also provides collateral information. Records and images were personally reviewed where available.  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 Records from the hospital were reviewed. He was in the ER on 07/22/19 for shaking and worsening cognition in the setting of dementia, diagnosed and treated with IV fluids followed by improvement. On 3/12, he was found unresponsive at [facility]. At baseline he is alert and oriented x 4, able to use his wheelchair during transfers. On EMS arrival, he was noted to have a RR of 6, also report of CBG in the 60s. He had a spell described as seizure by EMS, hand gripping during episodes, and was given Versed. He remained unresponsive and then began moving his left UE and gimacing, not focusing, then was intubated. He was quickly extubated because he became alert and squeezing hands to command. Family reported that shaking/shivering episodes started in December 2020. He began radiation treatment for prostate cancer in November. Prior to all this, he had always been extremely independent. He has been using his walker since the stroke in 2015, however had been wheelchair-bound since December, reporting his legs felt spongy. Family felt worsening was in parallel with  progression of 30-day course of radiation treatments. Willie [name] shows a video of the shivering episodes, she reports he was having right hand and leg jerking and saying it had something to do with the airconditioner. He then started having side to side upper body rocking movements. He had bowel incontinence and did not realize he had done it. He was started on low dose Keppra as an outpatient. In the ER, he was witnessed to have 4 shaking episodes in the course of 2 minutes, able to respond, starting with low amplitude shaking that gradually increased over about 15 seconds. He had an MRI brain which I personally reviewed, no acute changes, there was mild to moderate chronic microvascular disease, diffuse volume loss with ventricular prominence, not progressed since 2011. He had a routine EEG showing mild to moderate diffuse encephalopathy, then an overnight EEG done captured multiple episodes of generalized non-rhythmic whole body twitching without EEG correlate. Bloodwork showed AKI 38/1.44 (baseline 1.08), lactic acid initially 1.3, then 2.1, glucose 140, ammonia normal. He states he does not remember shaking, does not remember much except being wheeled out. Staff notes indicate that he has not had any seizures since hospital discharge, but on 3/24 had uncontrollable tremors at times. He refused to use his eye drops when Willie [name] says he knows he should be using them. He had a fall that day and was found on his knees. There is another report of fall on 3/21 where he was also found on his knees.   He had recently moved from independent living to assisted living in August 2020, which he was unhappy about.  He was driving around town applying to different apartment  complexes and leaving multiple deposits. He was missing his medications. There was also question of possible hallucinations. He would say his clothes were all wet from sweat, Willie [name] would take his clean clothes because he says they were all wet when they were  not. He says staff won't help him when he asks, that they have not changed the sheets since he has been there. Willie [name] reminds him they have changed the sheets, but they may not have changed the blankets. Another time he asked for a laxative and said he had a bowel movement but there was nothing in the toilet, he says he feels like there is something coming. While in rehab, staff had mentioned he walked out of the room naked one time. He reports his mood is sad.  He denies any headaches, dizziness, dysarthria/dysphagia, neck/back pain, anosmia, or tremors. With his stroke in 2015, he had diplopia and eye closure weakness, he states they had to sew his eyelids together. He is not sleeping well at night and is drowsy during the daytime.    PAST MEDICAL HISTORY: Past Medical History:  Diagnosis Date  . Alcohol abuse   . Benign prostatic hypertrophy   . Dementia [facility])    daughter-in-law does not know of this   . Diabetes mellitus   . Encephalopathy   . GERD (gastroesophageal reflux disease)   . History of tarsorrhaphy    right eye  . Hypertension   . Hypothyroidism   . Pneumonia   . Prostate cancer ([place])   . Stroke [facility])    right eye does not shut all the way-tarsorrhaphy    PAST SURGICAL HISTORY: Past Surgical History:  Procedure Laterality Date  . COLONOSCOPY WITH PROPOFOL N/A 08/24/2014   Procedure: COLONOSCOPY WITH PROPOFOL;  Surgeon: Garlan Fair, MD;  Location: [facility] ENDOSCOPY;  Service: Endoscopy;  Laterality: N/A;  . EYE SURGERY     Laser eye surgery  . GOLD SEED IMPLANT N/A 04/18/2019   Procedure: GOLD SEED IMPLANT;  Surgeon: Kathie Rhodes, MD;  Location: [facility] ORS;  Service: Urology;  Laterality: N/A;  . PROSTATE BIOPSY    . SPACE OAR INSTILLATION N/A 04/18/2019   Procedure: SPACE OAR INSTILLATION;  Surgeon: Kathie Rhodes, MD;  Location: [facility] ORS;  Service: Urology;  Laterality: N/A;    MEDICATIONS: Current Outpatient Medications on File Prior to Visit  Medication Sig Dispense  Refill  . acetaminophen (TYLENOL) 325 MG tablet Take 650 mg by mouth every 6 (six) hours as needed for moderate pain.    Marland Kitchen atorvastatin (LIPITOR) 10 MG tablet Take 10 mg by mouth daily.     . baclofen (LIORESAL) 10 MG tablet Take 10 mg by mouth 3 (three) times daily.    Marland Kitchen levETIRAcetam (KEPPRA) 250 MG tablet Take 250 mg by mouth 2 (two) times daily.    Marland Kitchen levothyroxine (SYNTHROID) 137 MCG tablet Take 137 mcg by mouth daily before breakfast.    . Memantine HCl-Donepezil HCl (NAMZARIC) 28-10 MG CP24 Take 1 capsule by mouth daily. 3 capsule 0  . metoprolol succinate (TOPROL-XL) 25 MG 24 hr tablet Take 0.5 tablets (12.5 mg total) by mouth daily. Take with or immediately following a meal.    . mirabegron ER (MYRBETRIQ) 50 MG TB24 tablet Take 50 mg by mouth daily.     . pantoprazole (PROTONIX) 40 MG tablet Take 1 tablet (40 mg total) by mouth at bedtime. 30 tablet   . polyethylene glycol (MIRALAX / GLYCOLAX) 17 g packet Take 17 g by mouth daily  as needed for moderate constipation.    . Sennosides (EX-LAX) 15 MG CHEW Chew 1 tablet by mouth 2 (two) times daily as needed (constipation).    . solifenacin (VESICARE) 10 MG tablet Take 10 mg by mouth daily.     . traZODone (DESYREL) 100 MG tablet Take 1 tablet (100 mg total) by mouth at bedtime. 30 tablet 0   No current facility-administered medications on file prior to visit.    ALLERGIES: Allergies  Allergen Reactions  . Aspirin Other (See Comments)    Had stroke in March last year. No more aspirin.  PER MAR - pt has no known allergies     FAMILY HISTORY: Family History  Problem Relation Age of Onset  . Prostate cancer Brother   . Colon cancer Brother   . Breast cancer Neg Hx   . Pancreatic cancer Neg Hx     SOCIAL HISTORY: Social History   Socioeconomic History  . Marital status: Divorced    Spouse name: Not on file  . Number of children: 2  . Years of education: Not on file  . Highest education level: Not on file  Occupational  History  . Not on file  Tobacco Use  . Smoking status: Never Smoker  . Smokeless tobacco: Never Used  Substance and Sexual Activity  . Alcohol use: Not Currently  . Drug use: No  . Sexual activity: Not Currently  Other Topics Concern  . Not on file  Social History Narrative   Patient resides at [facility] in [place].   Social Determinants of Health   Financial Resource Strain:   . Difficulty of Paying Living Expenses:   Food Insecurity:   . Worried About Charity fundraiser in the Last Year:   . Arboriculturist in the Last Year:   Transportation Needs:   . Film/video editor (Medical):   Marland Kitchen Lack of Transportation (Non-Medical):   Physical Activity:   . Days of Exercise per Week:   . Minutes of Exercise per Session:   Stress:   . Feeling of Stress :   Social Connections:   . Frequency of Communication with Friends and Family:   . Frequency of Social Gatherings with Friends and Family:   . Attends Religious Services:   . Active Member of Clubs or Organizations:   . Attends Archivist Meetings:   Marland Kitchen Marital Status:   Intimate Partner Violence:   . Fear of Current or Ex-Partner:   . Emotionally Abused:   Marland Kitchen Physically Abused:   . Sexually Abused:     REVIEW OF SYSTEMS: Constitutional: No fevers, chills, or sweats, no generalized fatigue, change in appetite Eyes: No visual changes, double vision, eye pain Ear, nose and throat: No hearing loss, ear pain, nasal congestion, sore throat Cardiovascular: No chest pain, palpitations Respiratory:  No shortness of breath at rest or with exertion, wheezes GastrointestinaI: No nausea, vomiting, diarrhea, abdominal pain, fecal incontinence Genitourinary:  No dysuria, urinary retention or frequency Musculoskeletal:  No neck pain, back pain Integumentary: No rash, pruritus, skin lesions Neurological: as above Psychiatric: No depression, insomnia, anxiety Endocrine: No palpitations, fatigue, diaphoresis, mood  swings, change in appetite, change in weight, increased thirst Hematologic/Lymphatic:  No anemia, purpura, petechiae. Allergic/Immunologic: no itchy/runny eyes, nasal congestion, recent allergic reactions, rashes  PHYSICAL EXAM: Vitals:   09/08/19 0806  BP: (!) 72/46  Pulse: 79  SpO2: 96%   General: No acute distress Head:  Normocephalic/atraumatic Skin/Extremities: No rash, no edema Neurological  Exam: Mental status: alert and oriented to person, place, and time, no dysarthria or aphasia, Fund of knowledge is appropriate.  Recent and remote memory are intact.  Attention and concentration are normal.    SLUMS score 21/30 [employer] Mental Exam 09/08/2019  Weekday Correct 1  Current year 1  What state are we in? 1  Amount spent 1  Amount left 0  # of Animals 2  5 objects recall 4  Number series 1  Hour markers 2  Time correct 0  Placed X in triangle correctly 1  Largest Figure 1  Name of male 2  Date back to work 0  Type of work 2  State she lived in 2  Total score 21    Cranial nerves: CN I: not tested CN II: pupils equal, round and reactive to light, visual fields intact CN III, IV, VI:  full range of motion, no nystagmus,s/p right eye lid surgery CN V: facial sensation intact CN VII: upper and lower face symmetric CN VIII: hearing intact to conversation Bulk & Tone: normal, no fasciculations. Motor: 5/5 throughout with no pronator drift. Sensation: intact to light touch, cold, pin, vibration and joint position sense.  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 The initial episode of  unresponsiveness appears to have been a hypoglycemic event, however the shaking spells captured on EEG did not show any epileptiform correlate. The video shown today also shows upper body side to side rocking that is typical of psychogenic events. Findings were discussed with the patient and his daughter-in-law, it appears the stress of his move is causing depression. We discussed starting Lexapro 10mg  daily, side effects discussed. He was started on Levetiracetam 500mg  BID, continue for now, with plans to wean off on his next visit. We discussed the diagnosis of dementia, likely vascular etiology. SLUMS score today 21/30.  Continue close supervision. Follow-up in 4-5 months, they know to call for any changes.   Thank you for allowing me to participate in the care of this patient. Please do not hesitate to call for any questions or concerns.   Ellouise Newer, M.D.  CC: Dr. Inda Merlin

## 2019-09-11 ENCOUNTER — Telehealth: Payer: Self-pay

## 2019-09-11 NOTE — Telephone Encounter (Signed)
FAX [phone]   [facility]. Pt going back there tomorrow early afternoon. Send order for LEXAPRO. Per [name] daughter in Sports coach.

## 2019-09-11 NOTE — Telephone Encounter (Signed)
Lexapro 10 mg faxed to [facility]. [name] pt daughter in law called stated that his script does not need to go to a local pharmacy that [facility] has their own pharmacy

## 2019-09-11 NOTE — Telephone Encounter (Signed)
Pls fax order for Lexapro 10mg  daily. Pls send in Rx to his local pharmacy if needed also, thanks!

## 2019-09-20 ENCOUNTER — Encounter: Payer: Self-pay | Admitting: Neurology

## 2019-09-24 ENCOUNTER — Ambulatory Visit: Payer: Medicare Other | Admitting: Neurology

## 2019-12-17 ENCOUNTER — Encounter (INDEPENDENT_AMBULATORY_CARE_PROVIDER_SITE_OTHER): Payer: Medicare Other | Admitting: Ophthalmology

## 2019-12-17 ENCOUNTER — Other Ambulatory Visit: Payer: Self-pay

## 2019-12-17 DIAGNOSIS — E11319 Type 2 diabetes mellitus with unspecified diabetic retinopathy without macular edema: Secondary | ICD-10-CM | POA: Diagnosis not present

## 2019-12-17 DIAGNOSIS — I10 Essential (primary) hypertension: Secondary | ICD-10-CM | POA: Diagnosis not present

## 2019-12-17 DIAGNOSIS — H35033 Hypertensive retinopathy, bilateral: Secondary | ICD-10-CM

## 2019-12-17 DIAGNOSIS — E113591 Type 2 diabetes mellitus with proliferative diabetic retinopathy without macular edema, right eye: Secondary | ICD-10-CM

## 2019-12-17 DIAGNOSIS — H43813 Vitreous degeneration, bilateral: Secondary | ICD-10-CM

## 2019-12-17 DIAGNOSIS — E113392 Type 2 diabetes mellitus with moderate nonproliferative diabetic retinopathy without macular edema, left eye: Secondary | ICD-10-CM

## 2020-01-06 ENCOUNTER — Encounter: Payer: Self-pay | Admitting: Neurology

## 2020-01-06 ENCOUNTER — Other Ambulatory Visit: Payer: Self-pay

## 2020-01-06 ENCOUNTER — Ambulatory Visit (INDEPENDENT_AMBULATORY_CARE_PROVIDER_SITE_OTHER): Payer: Medicare Other | Admitting: Neurology

## 2020-01-06 VITALS — BP 108/62 | Ht 72.0 in | Wt 159.0 lb

## 2020-01-06 DIAGNOSIS — F419 Anxiety disorder, unspecified: Secondary | ICD-10-CM | POA: Diagnosis not present

## 2020-01-06 DIAGNOSIS — F01518 Vascular dementia, unspecified severity, with other behavioral disturbance: Secondary | ICD-10-CM

## 2020-01-06 DIAGNOSIS — F0151 Vascular dementia with behavioral disturbance: Secondary | ICD-10-CM

## 2020-01-06 DIAGNOSIS — F445 Conversion disorder with seizures or convulsions: Secondary | ICD-10-CM | POA: Diagnosis not present

## 2020-01-06 MED ORDER — ESCITALOPRAM OXALATE 20 MG PO TABS: 20.0000 mg | ORAL_TABLET | Freq: Every day | ORAL | 3 refills | Status: DC

## 2020-01-06 NOTE — Patient Instructions (Signed)
1. Increase Lexapro to 20mg  daily 2. Continue all other medications 3. Follow-up in 6 months, call for any changes   FALL PRECAUTIONS: Be cautious when walking. Scan the area for obstacles that may increase the risk of trips and falls. When getting up in the mornings, sit up at the edge of the bed for a few minutes before getting out of bed. Consider elevating the bed at the head end to avoid drop of blood pressure when getting up. Walk always in a well-lit room (use night lights in the walls). Avoid area rugs or power cords from appliances in the middle of the walkways. Use a walker or a cane if necessary and consider physical therapy for balance exercise. Get your eyesight checked regularly.  ABILITY TO BE LEFT ALONE: If patient is unable to contact 911 operator, consider using LifeLine, or when the need is there, arrange for someone to stay with patients. Smoking is a fire hazard, consider supervision or cessation. Risk of wandering should be assessed by caregiver and if detected at any point, supervision and safe proof recommendations should be instituted.  RECOMMENDATIONS FOR ALL PATIENTS WITH MEMORY PROBLEMS: 1. Continue to exercise (Recommend 30 minutes of walking everyday, or 3 hours every week) 2. Increase social interactions - continue going to [place] and enjoy social gatherings with friends and family 3. Eat healthy, avoid fried foods and eat more fruits and vegetables 4. Maintain adequate blood pressure, blood sugar, and blood cholesterol level. Reducing the risk of stroke and cardiovascular disease also helps promoting better memory. 5. Avoid stressful situations. Live a simple life and avoid aggravations. Organize your time and prepare for the next day in anticipation. 6. Sleep well, avoid any interruptions of sleep and avoid any distractions in the bedroom that may interfere with adequate sleep quality 7. Avoid sugar, avoid sweets as there is a strong link between excessive sugar  intake, diabetes, and cognitive impairment The Mediterranean diet has been shown to help patients reduce the risk of progressive memory disorders and reduces cardiovascular risk. This includes eating fish, eat fruits and green leafy vegetables, nuts like almonds and hazelnuts, walnuts, and also use olive oil. Avoid fast foods and fried foods as much as possible. Avoid sweets and sugar as sugar use has been linked to worsening of memory function.  There is always a concern of gradual progression of memory problems. If this is the case, then we may need to adjust level of care according to patient needs. Support, both to the patient and caregiver, should then be put into place.

## 2020-01-06 NOTE — Progress Notes (Signed)
NEUROLOGY FOLLOW UP OFFICE NOTE  Willie [name] [id number] [birth year]  HISTORY OF PRESENT ILLNESS: I had the pleasure of seeing Willie [name] in follow-up in the neurology clinic on 01/06/2020. He is again accompanied by his daughter-in-law Willie [name] who helps supplement the history today. The patient was last seen 4 months ago for shaking episodes with negative EEG. Family endorsed stress with move from independent living to ALF. Since his last visit, Willie [name] has not heard of any further episodes of upper body rocking movements. She still notices hand shaking, staff has mentioned that his hand trembles, he has not noticed it himself. It has not affected using a fork/spoon or cup. He has had improvement with PT, OT, and ST, and has been released from all 3. He continues to have anxiety which is likely part of behavioral changes due to underlying dementia. Staff washed his sheets last weekend and he says it got lost and is very worried wanting them back right away. He was reportedly spitting in the dining room and hall and staff alerted her this was becoming an issue. One time he was getting cranky and speaking ugly to staff, this has subsided somewhat. His feet and ankles were swollen, he denies any pain and states this is better, he elevates his legs and used Epsom salts. He was started on Lexapro on last visit, currently on 15mg  daily with no side effects. He is on Namzaric. Since his last visit, his Keppra dose has also been reduced to 250mg  BID.    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 Records from the hospital were reviewed. He was in the ER on 07/22/19 for shaking and worsening cognition in the setting of dementia, diagnosed and treated with IV fluids followed by  improvement. On 3/12, he was found unresponsive at [facility]. At baseline he is alert and oriented x 4, able to use his wheelchair during transfers. On EMS arrival, he was noted to have a RR of 6, also report of CBG in the 60s. He had a spell described as seizure by EMS, hand gripping during episodes, and was given Versed. He remained unresponsive and then began moving his left UE and gimacing, not focusing, then was intubated. He was quickly extubated because he became alert and squeezing hands to command. Family reported that shaking/shivering episodes started in December 2020. He began radiation treatment for prostate cancer in November. Prior to all this, he had always been extremely independent. He has been using his walker since the stroke in 2015, however had been wheelchair-bound since December, reporting his legs felt spongy. Family felt worsening was in parallel with progression of 30-day course of radiation treatments. Willie [name] shows a video of the shivering episodes, she reports he was having right hand and leg jerking and saying it had something to do with the airconditioner. He then started having side to side upper body rocking movements. He had bowel incontinence and did not realize he had done it. He was started on low dose Keppra as an outpatient. In the ER, he was witnessed to have 4 shaking episodes in the course of 2 minutes, able to respond, starting with low amplitude shaking that gradually increased over about 15 seconds. He had an MRI brain which I personally reviewed, no acute changes, there was mild to moderate chronic microvascular disease,  diffuse volume loss with ventricular prominence, not progressed since 2011. He had a routine EEG showing mild to moderate diffuse encephalopathy, then an overnight EEG done captured multiple episodes of generalized non-rhythmic whole body twitching without EEG correlate. Bloodwork showed AKI 38/1.44 (baseline 1.08), lactic acid initially 1.3, then  2.1, glucose 140, ammonia normal. He states he does not remember shaking, does not remember much except being wheeled out. Staff notes indicate that he has not had any seizures since hospital discharge, but on 3/24 had uncontrollable tremors at times. He refused to use his eye drops when Willie [name] says he knows he should be using them. He had a fall that day and was found on his knees. There is another report of fall on 3/21 where he was also found on his knees.   He had recently moved from independent living to assisted living in August 2020, which he was unhappy about.  He was driving around town applying to different apartment complexes and leaving multiple deposits. He was missing his medications. There was also question of possible hallucinations. He would say his clothes were all wet from sweat, Willie [name] would take his clean clothes because he says they were all wet when they were not. He says staff won't help him when he asks, that they have not changed the sheets since he has been there. Willie [name] reminds him they have changed the sheets, but they may not have changed the blankets. Another time he asked for a laxative and said he had a bowel movement but there was nothing in the toilet, he says he feels like there is something coming. While in rehab, staff had mentioned he walked out of the room naked one time. He reports his mood is sad.  He denies any headaches, dizziness, dysarthria/dysphagia, neck/back pain, anosmia, or tremors. With his stroke in 2015, he had diplopia and eye closure weakness, he states they had to sew his eyelids together. He is not sleeping well at night and is drowsy during the daytime.    PAST MEDICAL HISTORY: Past Medical History:  Diagnosis Date  . Alcohol abuse   . Benign prostatic hypertrophy   . Dementia [facility])    daughter-in-law does not know of this   . Diabetes mellitus   . Encephalopathy   . GERD (gastroesophageal reflux disease)   . History of tarsorrhaphy    right  eye  . Hypertension   . Hypothyroidism   . Hypothyroidism   . Pneumonia   . [place]state cancer ([place])   . Seizures ([place])   . Stroke [facility])    right eye does not shut all the way-tarsorrhaphy    MEDICATIONS: Current Outpatient Medications on File Prior to Visit  Medication Sig Dispense Refill  . acetaminophen (TYLENOL) 325 MG tablet Take 650 mg by mouth every 6 (six) hours as needed for moderate pain.    Marland Kitchen atorvastatin (LIPITOR) 10 MG tablet Take 10 mg by mouth daily.     . baclofen (LIORESAL) 10 MG tablet Take 10 mg by mouth 3 (three) times daily.    . carboxymethylcellulose 1 % ophthalmic solution Apply 1 drop to eye. every 3 hours    . escitalopram (LEXAPRO) 10 MG tablet Take 10 mg by mouth daily. Take 1 1/2 tablets every morning    . levETIRAcetam (KEPPRA) 250 MG tablet Take 250 mg by mouth 2 (two) times daily.    Marland Kitchen levothyroxine (SYNTHROID) 137 MCG tablet Take 137 mcg by mouth daily before breakfast.    . Memantine  HCl-Donepezil HCl (NAMZARIC) 28-10 MG CP24 Take 1 capsule by mouth daily. 3 capsule 0  . metFORMIN (GLUCOPHAGE) 500 MG tablet Take 500 mg by mouth 1 day or 1 dose.    . mirabegron ER (MYRBETRIQ) 50 MG TB24 tablet Take 50 mg by mouth daily.     . pantoprazole (PROTONIX) 40 MG tablet Take 1 tablet (40 mg total) by mouth at bedtime. 30 tablet   . polyethylene glycol (MIRALAX / GLYCOLAX) 17 g packet Take 17 g by mouth daily as needed for moderate constipation.    . Sennosides (EX-LAX) 15 MG CHEW Chew 1 tablet by mouth 2 (two) times daily as needed (constipation).    . solifenacin (VESICARE) 10 MG tablet Take 10 mg by mouth daily.     . traZODone (DESYREL) 100 MG tablet Take 1 tablet (100 mg total) by mouth at bedtime. 30 tablet 0  . metoprolol succinate (TOPROL-XL) 25 MG 24 hr tablet Take 0.5 tablets (12.5 mg total) by mouth daily. Take with or immediately following a meal.     No current facility-administered medications on file prior to visit.    ALLERGIES: Allergies    Allergen Reactions  . Aspirin Other (See Comments)    Had stroke in March last year. No more aspirin.  PER MAR - pt has no known allergies     FAMILY HISTORY: Family History  Problem Relation Age of Onset  . Prostate cancer Brother   . Colon cancer Brother   . Breast cancer Neg Hx   . Pancreatic cancer Neg Hx     SOCIAL HISTORY: Social History   Socioeconomic History  . Marital status: Divorced    Spouse name: Not on file  . Number of children: 2  . Years of education: Not on file  . Highest education level: Not on file  Occupational History  . Not on file  Tobacco Use  . Smoking status: Never Smoker  . Smokeless tobacco: Never Used  Vaping Use  . Vaping Use: Never used  Substance and Sexual Activity  . Alcohol use: Not Currently  . Drug use: No  . Sexual activity: Not Currently  Other Topics Concern  . Not on file  Social History Narrative   Patient resides at [facility] in [place].   Right handed   Caffeine No   Social Determinants of Health   Financial Resource Strain:   . Difficulty of Paying Living Expenses:   Food Insecurity:   . Worried About Charity fundraiser in the Last Year:   . Arboriculturist in the Last Year:   Transportation Needs:   . Film/video editor (Medical):   Marland Kitchen Lack of Transportation (Non-Medical):   Physical Activity:   . Days of Exercise per Week:   . Minutes of Exercise per Session:   Stress:   . Feeling of Stress :   Social Connections:   . Frequency of Communication with Friends and Family:   . Frequency of Social Gatherings with Friends and Family:   . Attends Religious Services:   . Active Member of Clubs or Organizations:   . Attends Archivist Meetings:   Marland Kitchen Marital Status:   Intimate Partner Violence:   . Fear of Current or Ex-Partner:   . Emotionally Abused:   Marland Kitchen Physically Abused:   . Sexually Abused:     PHYSICAL EXAM: Vitals:   01/06/20 1344  BP: (!) 108/62  SpO2: 95%   General:  No acute distress Head:  Normocephalic/atraumatic Skin/Extremities: No rash, no edema Neurological Exam: alert and oriented to person, place, and time. No aphasia or dysarthria. Fund of knowledge is reduced.  Recent and remote memory are impaired.  Attention and concentration are reduced. Cranial nerves: Pupils equal, round. Extraocular movements intact with no nystagmus. Visual fields full. There is post-surgical contracture on the right eyelid, symmetric smile. Motor: Bulk and tone normal, muscle strength 5/5 throughout with no pronator drift. Finger to nose testing intact.  Gait narrow-based with walker. No tremor or cogwheeling today.    IMPRESSION: This is an 79 yo RH man with a history of hypertension, hyperlipidemia, diabetes, stroke in 2015 with residual mild right-sided weakness, who was hospitalized in 08/2019 for unresponsiveness and shaking episodes. 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 This appears to have quieted down. Staff and family report occasional hand tremors, none in office today, no parkinsonian signs seen. These may relate to anxiety, he is also having more behavioral changes at the ALF, increase Lexapro to 20mg  daily. He is on Namzaric. He is on a low dose of Levetiracetam 250mg  BID, we will plan to wean this off on his follow-up visit.Continue close supervision, follow-up in 6 months, they know to call for any changes.    Thank you for allowing me to participate in his care.  Please do not hesitate to call for any questions or concerns.   Ellouise Newer, M.D.   CC: Dr. Inda Merlin

## 2020-01-28 ENCOUNTER — Telehealth: Payer: Self-pay | Admitting: Neurology

## 2020-01-28 NOTE — Telephone Encounter (Signed)
Spoke with pts DIL who wanted to update on recent changes. She states ALF called her on Mon 8/16 saying pt was having worse symptoms and they were concerned about ability to care for him. She has seen pt over the last couple of days and is very worried for his safety. He has had recent c/o "ankles swelling form the inside". He sits and stomps feet for long periods of time, lays in bed with legs in air kicking and flailing about. Sits on the edge of bed with legs pulled up like he's doing crunches. He will do this with only rest periods of 30-60 seconds at a time for a few hours at a time. He is urinating in water bottle and trash cans(she just learned that he's been doing this since moving to ALF). He is incontinent during these episodes of increased activity. He will also defecate in the trash can. ALF was supposed to send urine for UA on Monday. She is concerned for pts safety, ALF says he doesn't qualify for memory care because he's not a flight risk. Told her that she can call other facilities to see if she can find a bed in a higher level of care facility which she plans to do. She says she has videos of these episodes that she'd like Dr Delice Lesch to see. Told her I'd give all info to Dr Delice Lesch and will call her back with updates, she verbalized understanding.

## 2020-01-28 NOTE — Telephone Encounter (Signed)
Patient's daughter [name] called in and is wanting to speak with someone about some worsening symptoms for the patient.

## 2020-01-29 NOTE — Telephone Encounter (Signed)
Patients DIL called back Willie [name]) and really needs to speak to Dr. Delice Lesch. She stated that patients tremors are becoming more intense and frequent. She said she watched patient and notices that when he "zones out" he will glaze over and start stomping and jerking.During episodes patient can talk alittle and respond to commands. She stated that he has jerking episodes while in bed as well. While patient had jerking episodes in his bed he is able to respond little and when asked "What are you doing papa?" he said "preparing for the strom" or "exercising." Patients DIL stated that the living facility calls her to come because they don't know what to do for him. She has videos of his episodes and would love for Dr. Delice Lesch to see them. In addition to the tremors and jerking, patient has started to poop in his trash can and urinate on himself and even forgets to eat his meals. An UTI has been done and that was normal. Willie [name] needs some guidance on what to do for patient. I informed patient that computers have been down today and  I would send Dr. Delice Lesch another message and speak to her in the morning. Patient voiced understanding.

## 2020-01-30 ENCOUNTER — Other Ambulatory Visit: Payer: Self-pay

## 2020-01-30 DIAGNOSIS — F445 Conversion disorder with seizures or convulsions: Secondary | ICD-10-CM

## 2020-01-30 MED ORDER — LEVETIRACETAM 500 MG PO TABS: 500.0000 mg | ORAL_TABLET | Freq: Two times a day (BID) | ORAL | 0 refills | Status: DC

## 2020-01-30 NOTE — Telephone Encounter (Signed)
Pls confirm if there have been any new medication changes since this started. Pls confirm dose of Keppra, is he still on 250mg  BID? If yes, she can try increasing to 500mg  BID and see if this helps. It sounds like he needs more supervision and would start looking into higher level of care facilities as Nevin Bloodgood previously discussed. I have an opening on Tues at 10am if they can come in then.Thanks

## 2020-01-30 NOTE — Telephone Encounter (Signed)
Called and spoke to [employer]  She informed me that there has been no changes to his medication and the last adjustment was done here and that was the increase in patients lexapro. [name] would like to try increasing his Keppra, but said we need to send that order to his assisted living. She also has been booked for the appointment on Tuesday at 10am.

## 2020-01-30 NOTE — Telephone Encounter (Signed)
Pls fax instructions to facility to increase Keppra to 500mg  BID. Thanks

## 2020-02-03 ENCOUNTER — Encounter: Payer: Self-pay | Admitting: Neurology

## 2020-02-03 ENCOUNTER — Ambulatory Visit: Payer: Medicare Other | Admitting: Neurology

## 2020-02-03 ENCOUNTER — Other Ambulatory Visit: Payer: Self-pay

## 2020-02-03 VITALS — BP 108/61 | HR 78 | Ht 72.0 in | Wt 146.0 lb

## 2020-02-03 DIAGNOSIS — F01518 Vascular dementia, unspecified severity, with other behavioral disturbance: Secondary | ICD-10-CM

## 2020-02-03 DIAGNOSIS — F445 Conversion disorder with seizures or convulsions: Secondary | ICD-10-CM | POA: Diagnosis not present

## 2020-02-03 DIAGNOSIS — F0151 Vascular dementia with behavioral disturbance: Secondary | ICD-10-CM

## 2020-02-03 MED ORDER — LORAZEPAM 1 MG PO TABS: ORAL_TABLET | ORAL | 5 refills | Status: DC

## 2020-02-03 MED ORDER — DIVALPROEX SODIUM ER 250 MG PO TB24: 250.0000 mg | ORAL_TABLET | Freq: Every day | ORAL | 11 refills | Status: DC

## 2020-02-03 NOTE — Progress Notes (Signed)
NEUROLOGY FOLLOW UP OFFICE NOTE  Willie [name] [id number] 02/14/1941  HISTORY OF PRESENT ILLNESS: I had the pleasure of seeing Willie [name] in follow-up in the neurology clinic on 02/03/2020. He is again accompanied by his daughter-in-law Willie [name] who helps supplement the history today. The patient was last seen a month ago and presents for an urgent follow-up due to an increase in behavioral changes and tremors. ALF had called Willie [name] on 8/16 saying he was having worse symptoms and they were concerned about ability to care for him. He reported ankle swelling from the inside. He would sit and stomp his feet for a long period of time, laying in bed with legs in air, kicking and flailing about. He would sit on the edge of the bed with legs pulled up like he is doing crunches. He would only rest for 30-60 seconds and would last a few hours. He is also urinating in a water bottle and trash cans, defecating in trash cans. He is incontinent during episodes of increased activity. Willie [name] also reported the tremors are becoming more intense and frequent. He would zone out, glaze over, then start stomping and jerking. He can talk a little and responds to commands during the episodes. When asked what he is doing, he says "preparing for the storm" or "exercising."  Willie [name] was called by ALF yesterday because he was masturbating multiple times in the dining room, as well as in his bedroom. He has had bruises, she thinks he is having falls that staff has not witnessed. She brings a video of the abnormal movements. One time, he was going to change his pants but did not come out for a long time. Willie [name] came to his room to find him lying on his bed with his legs in the air, flexed at the knee, with alternating movements. He was having side to side rocking movements of his trunk. He was able to answer her questions and said he was unable to stop the movements. She shows other videos where he is sitting on his chair with alternating  stomping leg movements, irregular arm movements where he briefly stops to cover himself with a blanket, saying he is cold. He is calm in the office today, making jokes. No abnormal movements noted.    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 Records from the hospital were reviewed. He was in the ER on 07/22/19 for shaking and worsening cognition in the setting of dementia, diagnosed and treated with IV fluids followed by improvement. On 3/12, he was found unresponsive at [facility]. At baseline he is alert and oriented x 4, able to use his wheelchair during transfers. On EMS arrival, he was noted to have a RR of 6, also report of CBG in the 60s. He had a spell described as seizure by EMS, hand gripping during episodes, and was given Versed. He remained unresponsive and then began moving his left UE and gimacing, not focusing, then was intubated. He was quickly extubated because he became alert and squeezing hands to command. Family reported that shaking/shivering episodes started in December 2020. He began radiation treatment for prostate cancer in November. Prior to all this, he had always been extremely independent. He has been using his walker since the stroke in 2015, however had been wheelchair-bound since December, reporting his legs felt  spongy. Family felt worsening was in parallel with progression of 30-day course of radiation treatments. Willie [name] shows a video of the shivering episodes, she reports he was having right hand and leg jerking and saying it had something to do with the airconditioner. He then started having side to side upper body rocking movements. He had bowel incontinence and did not realize he had done it. He was started on low dose Keppra as an  outpatient. In the ER, he was witnessed to have 4 shaking episodes in the course of 2 minutes, able to respond, starting with low amplitude shaking that gradually increased over about 15 seconds. He had an MRI brain which I personally reviewed, no acute changes, there was mild to moderate chronic microvascular disease, diffuse volume loss with ventricular prominence, not progressed since 2011. He had a routine EEG showing mild to moderate diffuse encephalopathy, then an overnight EEG done captured multiple episodes of generalized non-rhythmic whole body twitching without EEG correlate. Bloodwork showed AKI 38/1.44 (baseline 1.08), lactic acid initially 1.3, then 2.1, glucose 140, ammonia normal. He states he does not remember shaking, does not remember much except being wheeled out. Staff notes indicate that he has not had any seizures since hospital discharge, but on 3/24 had uncontrollable tremors at times. He refused to use his eye drops when Willie [name] says he knows he should be using them. He had a fall that day and was found on his knees. There is another report of fall on 3/21 where he was also found on his knees.   He had recently moved from independent living to assisted living in August 2020, which he was unhappy about.  He was driving around town applying to different apartment complexes and leaving multiple deposits. He was missing his medications. There was also question of possible hallucinations. He would say his clothes were all wet from sweat, Willie [name] would take his clean clothes because he says they were all wet when they were not. He says staff won't help him when he asks, that they have not changed the sheets since he has been there. Willie [name] reminds him they have changed the sheets, but they may not have changed the blankets. Another time he asked for a laxative and said he had a bowel movement but there was nothing in the toilet, he says he feels like there is something coming. While in rehab, staff  had mentioned he walked out of the room naked one time. He reports his mood is sad.  He denies any headaches, dizziness, dysarthria/dysphagia, neck/back pain, anosmia, or tremors. With his stroke in 2015, he had diplopia and eye closure weakness, he states they had to sew his eyelids together. He is not sleeping well at night and is drowsy during the daytime.    PAST MEDICAL HISTORY: Past Medical History:  Diagnosis Date  . Alcohol abuse   . Benign prostatic hypertrophy   . Dementia [facility]    daughter-in-law does not know of this   . Diabetes mellitus   . Encephalopathy   . GERD (gastroesophageal reflux disease)   . History of tarsorrhaphy    right eye  . Hypertension   . Hypothyroidism   . Hypothyroidism   . Pneumonia   . Prostate cancer ([place])   . Seizures ([place])   . Stroke [facility])    right eye does not shut all the way-tarsorrhaphy    MEDICATIONS: Current Outpatient Medications on File Prior to Visit  Medication Sig Dispense Refill  . acetaminophen (TYLENOL) 325  MG tablet Take 650 mg by mouth every 6 (six) hours as needed for moderate pain.    Marland Kitchen atorvastatin (LIPITOR) 10 MG tablet Take 10 mg by mouth daily.     . baclofen (LIORESAL) 10 MG tablet Take 10 mg by mouth 3 (three) times daily.    Marland Kitchen escitalopram (LEXAPRO) 20 MG tablet Take 1 tablet (20 mg total) by mouth daily. 90 tablet 3  . levETIRAcetam (KEPPRA) 500 MG tablet Take 1 tablet (500 mg total) by mouth 2 (two) times daily. 60 tablet 0  . levothyroxine (SYNTHROID) 137 MCG tablet Take 137 mcg by mouth daily before breakfast.    . Memantine HCl-Donepezil HCl (NAMZARIC) 28-10 MG CP24 Take 1 capsule by mouth daily. 3 capsule 0  . metFORMIN (GLUCOPHAGE) 500 MG tablet Take 500 mg by mouth 1 day or 1 dose.    . metoprolol succinate (TOPROL-XL) 25 MG 24 hr tablet Take 0.5 tablets (12.5 mg total) by mouth daily. Take with or immediately following a meal.    . mirabegron ER (MYRBETRIQ) 50 MG TB24 tablet Take 50 mg by mouth daily.       . pantoprazole (PROTONIX) 40 MG tablet Take 1 tablet (40 mg total) by mouth at bedtime. 30 tablet   . polyethylene glycol (MIRALAX / GLYCOLAX) 17 g packet Take 17 g by mouth daily as needed for moderate constipation.    . Sennosides (EX-LAX) 15 MG CHEW Chew 1 tablet by mouth 2 (two) times daily as needed (constipation).    . solifenacin (VESICARE) 10 MG tablet Take 10 mg by mouth daily.     . traZODone (DESYREL) 100 MG tablet Take 1 tablet (100 mg total) by mouth at bedtime. 30 tablet 0   No current facility-administered medications on file prior to visit.    ALLERGIES: Allergies  Allergen Reactions  . Aspirin Other (See Comments)    Had stroke in March last year. No more aspirin.  PER MAR - pt has no known allergies     FAMILY HISTORY: Family History  Problem Relation Age of Onset  . Prostate cancer Brother   . Colon cancer Brother   . Breast cancer Neg Hx   . Pancreatic cancer Neg Hx     SOCIAL HISTORY: Social History   Socioeconomic History  . Marital status: Divorced    Spouse name: Not on file  . Number of children: 2  . Years of education: Not on file  . Highest education level: Not on file  Occupational History  . Not on file  Tobacco Use  . Smoking status: Never Smoker  . Smokeless tobacco: Never Used  Vaping Use  . Vaping Use: Never used  Substance and Sexual Activity  . Alcohol use: Not Currently  . Drug use: No  . Sexual activity: Not Currently  Other Topics Concern  . Not on file  Social History Narrative   Patient resides at [facility] in [place].   Right handed   Caffeine No   Social Determinants of Health   Financial Resource Strain:   . Difficulty of Paying Living Expenses: Not on file  Food Insecurity:   . Worried About Charity fundraiser in the Last Year: Not on file  . Ran Out of Food in the Last Year: Not on file  Transportation Needs:   . Lack of Transportation (Medical): Not on file  . Lack of Transportation  (Non-Medical): Not on file  Physical Activity:   . Days of Exercise per Week: Not  on file  . Minutes of Exercise per Session: Not on file  Stress:   . Feeling of Stress : Not on file  Social Connections:   . Frequency of Communication with Friends and Family: Not on file  . Frequency of Social Gatherings with Friends and Family: Not on file  . Attends Religious Services: Not on file  . Active Member of Clubs or Organizations: Not on file  . Attends Archivist Meetings: Not on file  . Marital Status: Not on file  Intimate Partner Violence:   . Fear of Current or Ex-Partner: Not on file  . Emotionally Abused: Not on file  . Physically Abused: Not on file  . Sexually Abused: Not on file    PHYSICAL EXAM: Vitals:   02/03/20 1017  BP: 108/61  Pulse: 78  SpO2: 96%   General: No acute distress Head:  Normocephalic/atraumatic Skin/Extremities: No rash, no edema Neurological Exam: alert and awake. No aphasia or dysarthria. Fund of knowledge is reduced. Recent and remote memory are impaired. Attention and concentration are reduced. Cranial nerves: No facial asymmetry. Motor: moves all extremities symmetrically. No abnormal movements in the office today.    IMPRESSION: This is an 78 yo RH man with a history of hypertension, hyperlipidemia, diabetes, stroke in 2015 with residual mild right-sided weakness, who was hospitalized in 08/2019 for unresponsiveness and shaking episodes. The initial episode of unresponsiveness appears to have been a hypoglycemic event, however the shaking spells captured on EEG did not show any epileptiform correlate. Episodes had initially quieted down, however in the past month, he has been having increased frequency lasting for hours per Willie [name]. Video shown today again shows semiology suggestive of non-epileptic events (irregular, alternating movements, body rocking). He is also having progressive cognitive and behavioral changes, urinating and defecating  outside the bathroom, self-stimulating behaviors. Discussed need for higher level of care at this point. We discussed switching to Depakote ER 250mg  qhs for mood stabilization, wean off Keppra. He may be given prn Ativan 1mg  for movements lasting more than 15 minutes. Continue Lexapro 20mg  daily. Caregiver support provided. Follow-up in 3 months, Willie [name] knows to call for any changes.    Thank you for allowing me to participate in his care.  Please do not hesitate to call for any questions or concerns.   Ellouise Newer, M.D.   CC: Dr. Inda Merlin

## 2020-02-03 NOTE — Patient Instructions (Addendum)
1. Wean off Keppra: Take 1/2 tablet twice a day for a week, then stop  2. Start Depakote ER 250mg  every night  3. May give Arivan 1mg  as needed for movements lasting more than 15 minutes. Do not give more than 2-3 times a week.  4. Continue all other medications

## 2020-02-10 ENCOUNTER — Emergency Department (HOSPITAL_COMMUNITY)
Admission: EM | Admit: 2020-02-10 | Discharge: 2020-02-10 | Disposition: A | Discharge status: Home / Self Care | Payer: Medicare Other | Attending: Emergency Medicine | Admitting: Emergency Medicine

## 2020-02-10 ENCOUNTER — Encounter (HOSPITAL_COMMUNITY): Payer: Self-pay

## 2020-02-10 ENCOUNTER — Other Ambulatory Visit: Payer: Self-pay

## 2020-02-10 ENCOUNTER — Emergency Department (HOSPITAL_COMMUNITY): Payer: Medicare Other

## 2020-02-10 DIAGNOSIS — Z79899 Other long term (current) drug therapy: Secondary | ICD-10-CM | POA: Diagnosis not present

## 2020-02-10 DIAGNOSIS — F0390 Unspecified dementia without behavioral disturbance: Secondary | ICD-10-CM | POA: Insufficient documentation

## 2020-02-10 DIAGNOSIS — S0083XA Contusion of other part of head, initial encounter: Secondary | ICD-10-CM | POA: Diagnosis not present

## 2020-02-10 DIAGNOSIS — S40022A Contusion of left upper arm, initial encounter: Secondary | ICD-10-CM | POA: Diagnosis not present

## 2020-02-10 DIAGNOSIS — E119 Type 2 diabetes mellitus without complications: Secondary | ICD-10-CM | POA: Diagnosis not present

## 2020-02-10 DIAGNOSIS — W010XXA Fall on same level from slipping, tripping and stumbling without subsequent striking against object, initial encounter: Secondary | ICD-10-CM | POA: Insufficient documentation

## 2020-02-10 DIAGNOSIS — I10 Essential (primary) hypertension: Secondary | ICD-10-CM | POA: Insufficient documentation

## 2020-02-10 DIAGNOSIS — Y939 Activity, unspecified: Secondary | ICD-10-CM | POA: Insufficient documentation

## 2020-02-10 DIAGNOSIS — Z8546 Personal history of malignant neoplasm of prostate: Secondary | ICD-10-CM | POA: Insufficient documentation

## 2020-02-10 DIAGNOSIS — Y999 Unspecified external cause status: Secondary | ICD-10-CM | POA: Insufficient documentation

## 2020-02-10 DIAGNOSIS — E039 Hypothyroidism, unspecified: Secondary | ICD-10-CM | POA: Insufficient documentation

## 2020-02-10 DIAGNOSIS — Z7984 Long term (current) use of oral hypoglycemic drugs: Secondary | ICD-10-CM | POA: Diagnosis not present

## 2020-02-10 DIAGNOSIS — Y92009 Unspecified place in unspecified non-institutional (private) residence as the place of occurrence of the external cause: Secondary | ICD-10-CM | POA: Insufficient documentation

## 2020-02-10 DIAGNOSIS — R911 Solitary pulmonary nodule: Secondary | ICD-10-CM | POA: Insufficient documentation

## 2020-02-10 DIAGNOSIS — IMO0001 Reserved for inherently not codable concepts without codable children: Secondary | ICD-10-CM | POA: Diagnosis present

## 2020-02-10 DIAGNOSIS — S0990XA Unspecified injury of head, initial encounter: Secondary | ICD-10-CM | POA: Diagnosis present

## 2020-02-10 NOTE — ED Provider Notes (Signed)
[place] EMERGENCY DEPARTMENT Provider Note   CSN: [id number] Arrival date & time: 02/10/20  1216     History No chief complaint on file.   Willie [name] is a 79 y.o. 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  EMS states that he fell due to slipping on a damp floor.  Daughter-in-law at bedside states that he was found to have bruising on the left side of his head and left arm.  Staff was concerned that he was having arm pain.  Denies any loss of consciousness.  Patient denying any pain at this time.  He is usually ambulatory with a walker.  Daughter states that in the past 2 weeks she has noticed worsening dementia although had "full work-up done less than a week ago that was normal."  States that he was put on antibiotics for possible prostatitis.  HPI     Past Medical History:  Diagnosis Date  . Alcohol abuse   . Benign prostatic hypertrophy   . Dementia [facility])    daughter-in-law does not know of this   . Diabetes mellitus   . Encephalopathy   . GERD (gastroesophageal reflux disease)   . History of tarsorrhaphy    right eye  . Hypertension   . Hypothyroidism   . Hypothyroidism   . Pneumonia   . Prostate cancer ([place])   . Seizures ([place])   . Stroke [facility])    right eye does not shut all the way-tarsorrhaphy    Patient Active Problem List   Diagnosis Date Noted  . Lung nodule < 6cm on CT 02/10/2020  . Malignant neoplasm of prostate ([place]) 01/21/2019  . Generalized weakness 01/01/2019  . Acute metabolic encephalopathy 08/65/7846  . Controlled type 2 diabetes mellitus with hypoglycemia ([place]) 12/31/2018  . Hypothyroidism 12/31/2018  . Normocytic normochromic anemia 12/31/2018  . Hyponatremia 12/31/2018  . Nuclear sclerotic cataract of both eyes  09/03/2014  . 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 UGIB (upper gastrointestinal bleed) 08/16/2013  . HTN (hypertension) 08/15/2013  . Diabetes type 2, uncontrolled ([place]) 08/15/2013  . HLD (hyperlipidemia) 08/15/2013  . ICH (intracerebral hemorrhage) ([place]) 08/15/2013    Past Surgical History:  Procedure Laterality Date  . COLONOSCOPY WITH PROPOFOL N/A 08/24/2014   Procedure: COLONOSCOPY WITH PROPOFOL;  Surgeon: Garlan Fair, MD;  Location: [facility] ENDOSCOPY;  Service: Endoscopy;  Laterality: N/A;  . EYE SURGERY     Laser eye surgery  . GOLD SEED IMPLANT N/A 04/18/2019   Procedure: GOLD SEED IMPLANT;  Surgeon: Kathie Rhodes, MD;  Location: [facility] ORS;  Service: Urology;  Laterality: N/A;  . PROSTATE BIOPSY    . SPACE OAR INSTILLATION N/A 04/18/2019   Procedure: SPACE OAR INSTILLATION;  Surgeon: Kathie Rhodes, MD;  Location: [facility] ORS;  Service: Urology;  Laterality: N/A;       Family History  Problem Relation Age of Onset  . Prostate cancer Brother   . Colon cancer Brother   . Breast cancer Neg Hx   . Pancreatic cancer Neg Hx     Social History   Tobacco Use  . Smoking status: Never Smoker  . Smokeless tobacco: Never Used  Vaping Use  . Vaping Use: Never used  Substance Use Topics  . Alcohol use: Not Currently  . Drug use: No  Home Medications Prior to Admission medications   Medication Sig Start Date End Date Taking? Authorizing Provider  acetaminophen (TYLENOL) 325 MG tablet Take 650 mg by mouth every 6 (six) hours as needed for moderate pain.    [provider]  atorvastatin (LIPITOR) 10 MG tablet Take 10 mg by mouth daily.     [provider]  baclofen (LIORESAL) 10 MG tablet Take 10 mg by mouth 3 (three) times daily. 06/14/19   [provider]  divalproex (DEPAKOTE ER) 250 MG 24 hr tablet Take 1 tablet (250 mg total) by mouth daily. 02/03/20   Cameron Sprang, MD  escitalopram (LEXAPRO) 20 MG tablet Take 1 tablet (20 mg total) by  mouth daily. 01/06/20   Cameron Sprang, MD  lactose free nutrition (BOOST PLUS) LIQD Take 237 mLs by mouth 2 (two) times daily between meals.    [provider]  levETIRAcetam (KEPPRA) 500 MG tablet Take 1 tablet (500 mg total) by mouth 2 (two) times daily. 01/30/20   Cameron Sprang, MD  levothyroxine (SYNTHROID) 137 MCG tablet Take 137 mcg by mouth daily before breakfast.    [provider]  LORazepam (ATIVAN) 1 MG tablet Give 1 tablet as needed for abnormal movements lasting more than 15 minutes. Do not give more than 2-3 times a week 02/03/20   Cameron Sprang, MD  Memantine HCl-Donepezil HCl [facility]) 28-10 MG CP24 Take 1 capsule by mouth daily. 08/26/19   Nita Sells, MD  metFORMIN (GLUCOPHAGE) 500 MG tablet Take 500 mg by mouth 1 day or 1 dose.    [provider]  mirabegron ER (MYRBETRIQ) 50 MG TB24 tablet Take 50 mg by mouth daily.     [provider]  pantoprazole (PROTONIX) 40 MG tablet Take 1 tablet (40 mg total) by mouth at bedtime. 08/26/19   Nita Sells, MD  polyethylene glycol (MIRALAX / GLYCOLAX) 17 g packet Take 17 g by mouth daily as needed for moderate constipation.    [provider]  Propylene Glycol (SYSTANE BALANCE) 0.6 % SOLN Apply to eye every 4 (four) hours.    [provider]  Sennosides (EX-LAX) 15 MG CHEW Chew by mouth as needed.    [provider]  traZODone (DESYREL) 100 MG tablet Take 1 tablet (100 mg total) by mouth at bedtime. 08/26/19   Nita Sells, MD  White Petrolatum-Mineral Oil (GENTEAL TEARS NIGHT-TIME) OINT Apply to eye in the morning and at bedtime.    [provider]    Allergies    Aspirin  Review of Systems   Review of Systems  Unable to perform ROS: Dementia  Cardiovascular: Negative for chest pain.  Gastrointestinal: Negative for abdominal distention and vomiting.    Physical Exam Updated Vital Signs BP (!) 145/78 (BP Location: Right Arm)   Pulse  85   Temp 98.2 F (36.8 C) (Oral)   Resp 14   SpO2 96%   Physical Exam Vitals and nursing note reviewed.  Constitutional:      General: He is not in acute distress.    Appearance: He is well-developed.  HENT:     Head: Normocephalic and atraumatic.     Nose: Nose normal.  Eyes:     General: No scleral icterus.       Right eye: No discharge.        Left eye: No discharge.     Conjunctiva/sclera: Conjunctivae normal.     Pupils: Pupils are equal, round, and reactive to light.  Cardiovascular:     Rate and Rhythm: Normal rate and regular rhythm.     Heart sounds: Normal heart sounds. No murmur heard.  No friction rub. No gallop.   Pulmonary:     Effort: Pulmonary effort is normal. No respiratory distress.     Breath sounds: Normal breath sounds.  Abdominal:     General: Bowel sounds are normal. There is no distension.     Palpations: Abdomen is soft.     Tenderness: There is no abdominal tenderness. There is no guarding.  Musculoskeletal:        General: Normal range of motion.     Cervical back: Normal range of motion and neck supple.     Comments: 2+ DP pulses noted bilaterally.  Skin:    General: Skin is warm and dry.     Findings: No rash.  Neurological:     Mental Status: He is alert.     Motor: No abnormal muscle tone.     Coordination: Coordination normal.     Comments: Alert, oriented to self and family members at bedside.  Disoriented to place and time per baseline. R sided facial droop noted which daughter states is chronic.  Normal sensation to light touch of bilateral upper and lower extremities and face.  Strength 5/5 in bilateral upper and lower extremities.  Moving all extremities without difficulty.  No deformities noted.     ED Results / Procedures / Treatments   Labs (all labs ordered are listed, but only abnormal results are displayed) Labs Reviewed - No data to display  EKG None  Radiology CT Head Wo Contrast  Result Date: 02/10/2020 CLINICAL  DATA:  Fall with head and neck pain. EXAM: CT HEAD WITHOUT CONTRAST CT CERVICAL SPINE WITHOUT CONTRAST TECHNIQUE: Multidetector CT imaging of the head and cervical spine was performed following the standard protocol without intravenous contrast. Multiplanar CT image reconstructions of the cervical spine were also generated. COMPARISON:  CT head dated 08/22/2019 report from CT cervical spine dated 01/11/2011. FINDINGS: CT HEAD FINDINGS Brain: No evidence of acute infarction, hemorrhage, hydrocephalus, extra-axial collection or mass lesion/mass effect. There is mild cerebral volume loss with associated ex vacuo dilatation. Periventricular white matter hypoattenuation likely represents chronic small vessel ischemic disease. Vascular: There are vascular calcifications in the carotid siphons. Skull: Normal. Negative for fracture or focal lesion. Sinuses/Orbits: No acute finding. Other: There is scalp soft tissue swelling overlying the left frontal bone. CT CERVICAL SPINE FINDINGS Alignment: Normal. Skull base and vertebrae: No acute fracture. No primary bone lesion or focal pathologic process. Soft tissues and spinal canal: No prevertebral fluid or swelling. No visible canal hematoma. Disc levels:  Multilevel degenerative disc and joint disease. Upper chest: There is mild apical scarring on the right. A 4 mm solid pulmonary nodule is seen in the right upper lobe. Other: None. IMPRESSION: 1. No acute intracranial process. 2. No acute osseous injury in the cervical spine. 3. A 4 mm solid pulmonary nodule in the right upper lobe. No follow-up needed if patient is low-risk. Non-contrast chest CT can be considered in 12 months if patient is high-risk. This recommendation follows the consensus statement: Guidelines for Management of Incidental Pulmonary Nodules Detected on CT Images: From the Fleischner Society 2017; Radiology 2017; 284:228-243. Electronically Signed   By: Zerita Boers M.D.   On: 02/10/2020 13:59   CT  Cervical Spine Wo Contrast  Result Date: 02/10/2020 CLINICAL DATA:  Fall with head and neck pain. EXAM: CT HEAD WITHOUT CONTRAST  CT CERVICAL SPINE WITHOUT CONTRAST TECHNIQUE: Multidetector CT imaging of the head and cervical spine was performed following the standard protocol without intravenous contrast. Multiplanar CT image reconstructions of the cervical spine were also generated. COMPARISON:  CT head dated 08/22/2019 report from CT cervical spine dated 01/11/2011. FINDINGS: CT HEAD FINDINGS Brain: No evidence of acute infarction, hemorrhage, hydrocephalus, extra-axial collection or mass lesion/mass effect. There is mild cerebral volume loss with associated ex vacuo dilatation. Periventricular white matter hypoattenuation likely represents chronic small vessel ischemic disease. Vascular: There are vascular calcifications in the carotid siphons. Skull: Normal. Negative for fracture or focal lesion. Sinuses/Orbits: No acute finding. Other: There is scalp soft tissue swelling overlying the left frontal bone. CT CERVICAL SPINE FINDINGS Alignment: Normal. Skull base and vertebrae: No acute fracture. No primary bone lesion or focal pathologic process. Soft tissues and spinal canal: No prevertebral fluid or swelling. No visible canal hematoma. Disc levels:  Multilevel degenerative disc and joint disease. Upper chest: There is mild apical scarring on the right. A 4 mm solid pulmonary nodule is seen in the right upper lobe. Other: None. IMPRESSION: 1. No acute intracranial process. 2. No acute osseous injury in the cervical spine. 3. A 4 mm solid pulmonary nodule in the right upper lobe. No follow-up needed if patient is low-risk. Non-contrast chest CT can be considered in 12 months if patient is high-risk. This recommendation follows the consensus statement: Guidelines for Management of Incidental Pulmonary Nodules Detected on CT Images: From the Fleischner Society 2017; Radiology 2017; 284:228-243. Electronically  Signed   By: Zerita Boers M.D.   On: 02/10/2020 13:59    Procedures Procedures (including critical care time)  Medications Ordered in ED Medications - No data to display  ED Course  I have reviewed the triage vital signs and the nursing notes.  Pertinent labs & imaging results that were available during my care of the patient were reviewed by me and considered in my medical decision making (see chart for details).    MDM Rules/Calculators/A&P                          79 year old male with past medical history of dementia, hypertension, prostate cancer status post radiation therapy presenting to the ED with two mechanical fall that occurred prior to arrival. He fell due to slipping on a damp floor. Bruising on left side of his head and left arm. Staff concerned that he was having left arm pain. Denies any loss of consciousness. Daughter at bedside states that he is at his mental baseline secondary to his dementia. He did have a full work-up done by his PCP less than 1 week ago that was essentially normal. She does note worsening dementia for the past 2 weeks but she is unsure if this is "just due to the normal process." He was put on antibiotics for possible prostatitis last week. Denies any fevers. Patient denies any pain currently. He is moving all extremities without difficulty. Strength 5/5 in bilateral upper and lower extremities. He does have a residual right-sided facial droop from his prior stroke over 5 years ago. No hip tenderness or shoulder tenderness bilaterally. CT of the head and cervical spine show no acute abnormalities. It does show a right-sided 4 mm pulmonary nodule that will need to be followed up by his PCP. Inform daughter-in-law this. She is declining further work-up at this time. We will have him follow-up with PCP and return for worsening symptoms. Patient  discussed with and seen by the attending, Dr. Sherry Ruffing.   Patient is hemodynamically stable, in NAD. Evaluation  does not show pathology that would require ongoing emergent intervention or inpatient treatment. I explained the diagnosis to the patient. Pain has been managed and has no complaints prior to discharge. Patient is comfortable with above plan and is stable for discharge at this time. All questions were answered prior to disposition. Strict return precautions for returning to the ED were discussed. Encouraged follow up with PCP.   An After Visit Summary was printed and given to the patient.   Portions of this note were generated with [employer]. Dictation errors may occur despite best attempts at proofreading.  Final Clinical Impression(s) / ED Diagnoses Final diagnoses:  Fall in home, initial encounter  Lung nodule < 6cm on CT    Rx / DC Orders ED Discharge Orders    None       Delia Heady, PA-C 02/10/20 1829    Tegeler, Gwenyth Allegra, MD 02/11/20 1151

## 2020-02-10 NOTE — ED Triage Notes (Signed)
Patient arrived by [facility] from [facility] after 2 falls today. Patient reports that he had mechanical falls after slipping on damp floor. Patient denies pain. Hx of dementia and alert to baseline.

## 2020-02-10 NOTE — Discharge Instructions (Addendum)
Continue home medications as previously prescribed. Follow-up with a primary care provider. Your CT scan today showed that you had a 4 mm nodule in your right lung. You will need to inform your primary care provider about this for surveillance. Return to the ER for additional injuries or falls, headache, blurry vision, changes to gait, chest pain or shortness of breath.

## 2020-02-11 ENCOUNTER — Emergency Department (HOSPITAL_COMMUNITY): Payer: Medicare Other

## 2020-02-11 ENCOUNTER — Other Ambulatory Visit: Payer: Self-pay

## 2020-02-11 ENCOUNTER — Emergency Department (HOSPITAL_COMMUNITY)
Admission: EM | Admit: 2020-02-11 | Discharge: 2020-02-11 | Disposition: A | Discharge status: Home / Self Care | Payer: Medicare Other | Attending: Emergency Medicine | Admitting: Emergency Medicine

## 2020-02-11 DIAGNOSIS — R262 Difficulty in walking, not elsewhere classified: Secondary | ICD-10-CM | POA: Insufficient documentation

## 2020-02-11 DIAGNOSIS — M6281 Muscle weakness (generalized): Secondary | ICD-10-CM | POA: Diagnosis not present

## 2020-02-11 DIAGNOSIS — W010XXA Fall on same level from slipping, tripping and stumbling without subsequent striking against object, initial encounter: Secondary | ICD-10-CM | POA: Insufficient documentation

## 2020-02-11 DIAGNOSIS — Y999 Unspecified external cause status: Secondary | ICD-10-CM | POA: Diagnosis not present

## 2020-02-11 DIAGNOSIS — F0390 Unspecified dementia without behavioral disturbance: Secondary | ICD-10-CM | POA: Diagnosis not present

## 2020-02-11 DIAGNOSIS — Z79890 Hormone replacement therapy (postmenopausal): Secondary | ICD-10-CM | POA: Diagnosis not present

## 2020-02-11 DIAGNOSIS — Y929 Unspecified place or not applicable: Secondary | ICD-10-CM | POA: Diagnosis not present

## 2020-02-11 DIAGNOSIS — R531 Weakness: Secondary | ICD-10-CM | POA: Insufficient documentation

## 2020-02-11 DIAGNOSIS — E039 Hypothyroidism, unspecified: Secondary | ICD-10-CM | POA: Diagnosis not present

## 2020-02-11 DIAGNOSIS — Z8546 Personal history of malignant neoplasm of prostate: Secondary | ICD-10-CM | POA: Diagnosis not present

## 2020-02-11 DIAGNOSIS — I10 Essential (primary) hypertension: Secondary | ICD-10-CM | POA: Diagnosis not present

## 2020-02-11 DIAGNOSIS — Z20822 Contact with and (suspected) exposure to covid-19: Secondary | ICD-10-CM | POA: Diagnosis not present

## 2020-02-11 DIAGNOSIS — R2681 Unsteadiness on feet: Secondary | ICD-10-CM | POA: Diagnosis not present

## 2020-02-11 DIAGNOSIS — Y939 Activity, unspecified: Secondary | ICD-10-CM | POA: Insufficient documentation

## 2020-02-11 DIAGNOSIS — Z9181 History of falling: Secondary | ICD-10-CM | POA: Diagnosis not present

## 2020-02-11 DIAGNOSIS — W19XXXA Unspecified fall, initial encounter: Secondary | ICD-10-CM

## 2020-02-11 DIAGNOSIS — Z79899 Other long term (current) drug therapy: Secondary | ICD-10-CM | POA: Insufficient documentation

## 2020-02-11 DIAGNOSIS — Z7984 Long term (current) use of oral hypoglycemic drugs: Secondary | ICD-10-CM | POA: Diagnosis not present

## 2020-02-11 DIAGNOSIS — E11649 Type 2 diabetes mellitus with hypoglycemia without coma: Secondary | ICD-10-CM | POA: Insufficient documentation

## 2020-02-11 LAB — CBC WITH DIFFERENTIAL/PLATELET
Abs Immature Granulocytes: 0.04 10*3/uL (ref 0.00–0.07)
Basophils Absolute: 0 10*3/uL (ref 0.0–0.1)
Basophils Relative: 0 %
Eosinophils Absolute: 0 10*3/uL (ref 0.0–0.5)
Eosinophils Relative: 0 %
HCT: 39.3 % (ref 39.0–52.0)
Hemoglobin: 12.5 g/dL — ABNORMAL LOW (ref 13.0–17.0)
Immature Granulocytes: 0 %
Lymphocytes Relative: 13 %
Lymphs Abs: 1.3 10*3/uL (ref 0.7–4.0)
MCH: 30.3 pg (ref 26.0–34.0)
MCHC: 31.8 g/dL (ref 30.0–36.0)
MCV: 95.2 fL (ref 80.0–100.0)
Monocytes Absolute: 0.4 10*3/uL (ref 0.1–1.0)
Monocytes Relative: 4 %
Neutro Abs: 8.1 10*3/uL — ABNORMAL HIGH (ref 1.7–7.7)
Neutrophils Relative %: 83 %
Platelets: 258 10*3/uL (ref 150–400)
RBC: 4.13 MIL/uL — ABNORMAL LOW (ref 4.22–5.81)
RDW: 12.8 % (ref 11.5–15.5)
WBC: 9.9 10*3/uL (ref 4.0–10.5)
nRBC: 0 % (ref 0.0–0.2)

## 2020-02-11 LAB — SARS CORONAVIRUS 2 BY RT PCR (HOSPITAL ORDER, PERFORMED IN ~~LOC~~ HOSPITAL LAB): SARS Coronavirus 2: NEGATIVE

## 2020-02-11 LAB — COMPREHENSIVE METABOLIC PANEL
ALT: 17 U/L (ref 0–44)
AST: 17 U/L (ref 15–41)
Albumin: 3.5 g/dL (ref 3.5–5.0)
Alkaline Phosphatase: 86 U/L (ref 38–126)
Anion gap: 13 (ref 5–15)
BUN: 55 mg/dL — ABNORMAL HIGH (ref 8–23)
CO2: 25 mmol/L (ref 22–32)
Calcium: 9.6 mg/dL (ref 8.9–10.3)
Chloride: 106 mmol/L (ref 98–111)
Creatinine, Ser: 1.23 mg/dL (ref 0.61–1.24)
GFR calc Af Amer: >60 mL/min (ref >60)
GFR calc non Af Amer: 55 mL/min — ABNORMAL LOW (ref >60)
Glucose, Bld: 212 mg/dL — ABNORMAL HIGH (ref 70–99)
Potassium: 4.1 mmol/L (ref 3.5–5.1)
Sodium: 144 mmol/L (ref 135–145)
Total Bilirubin: 0.7 mg/dL (ref 0.3–1.2)
Total Protein: 8.2 g/dL — ABNORMAL HIGH (ref 6.5–8.1)

## 2020-02-11 LAB — CBG MONITORING, ED: Glucose-Capillary: 197 mg/dL — ABNORMAL HIGH (ref 70–99)

## 2020-02-11 MED ORDER — MIRABEGRON ER 25 MG PO TB24
50.0000 mg | ORAL_TABLET | Freq: Every day | ORAL | Status: DC
  Administered 2020-02-11: 50 mg via ORAL
  Filled 2020-02-11: qty 2

## 2020-02-11 MED ORDER — METFORMIN HCL 500 MG PO TABS
500.0000 mg | ORAL_TABLET | ORAL | Status: DC
  Administered 2020-02-11: 500 mg via ORAL
  Filled 2020-02-11: qty 1

## 2020-02-11 MED ORDER — LEVOTHYROXINE SODIUM 137 MCG PO TABS: 137.0000 ug | ORAL_TABLET | Freq: Every day | ORAL | Status: DC

## 2020-02-11 MED ORDER — LEVETIRACETAM 500 MG PO TABS
500.0000 mg | ORAL_TABLET | Freq: Two times a day (BID) | ORAL | Status: DC
  Administered 2020-02-11: 500 mg via ORAL
  Filled 2020-02-11: qty 1

## 2020-02-11 MED ORDER — ATORVASTATIN CALCIUM 10 MG PO TABS
10.0000 mg | ORAL_TABLET | Freq: Every day | ORAL | Status: DC
  Administered 2020-02-11: 10 mg via ORAL
  Filled 2020-02-11: qty 1

## 2020-02-11 MED ORDER — DONEPEZIL HCL 5 MG PO TABS: 10.0000 mg | ORAL_TABLET | Freq: Every day | ORAL | Status: DC

## 2020-02-11 MED ORDER — ACETAMINOPHEN 325 MG PO TABS: 650.0000 mg | ORAL_TABLET | Freq: Four times a day (QID) | ORAL | Status: DC | PRN

## 2020-02-11 MED ORDER — PANTOPRAZOLE SODIUM 40 MG PO TBEC: 40.0000 mg | DELAYED_RELEASE_TABLET | Freq: Every day | ORAL | Status: DC

## 2020-02-11 MED ORDER — ESCITALOPRAM OXALATE 10 MG PO TABS
20.0000 mg | ORAL_TABLET | Freq: Every day | ORAL | Status: DC
  Administered 2020-02-11: 20 mg via ORAL
  Filled 2020-02-11: qty 2

## 2020-02-11 MED ORDER — DIVALPROEX SODIUM ER 250 MG PO TB24
250.0000 mg | ORAL_TABLET | Freq: Every day | ORAL | Status: DC
  Administered 2020-02-11: 250 mg via ORAL
  Filled 2020-02-11: qty 1

## 2020-02-11 MED ORDER — MEMANTINE HCL 5 MG PO TABS
10.0000 mg | ORAL_TABLET | Freq: Two times a day (BID) | ORAL | Status: DC
  Administered 2020-02-11: 10 mg via ORAL
  Filled 2020-02-11: qty 2

## 2020-02-11 MED ORDER — TRAZODONE HCL 100 MG PO TABS: 100.0000 mg | ORAL_TABLET | Freq: Every day | ORAL | Status: DC

## 2020-02-11 MED ORDER — BACLOFEN 10 MG PO TABS: 10.0000 mg | ORAL_TABLET | Freq: Three times a day (TID) | ORAL | Status: DC

## 2020-02-11 MED ORDER — MEMANTINE HCL-DONEPEZIL HCL 28-10 MG PO CP24
1.0000 | ORAL_CAPSULE | Freq: Every day | ORAL | Status: DC
Start: 2020-02-11 — End: 2020-02-11

## 2020-02-11 NOTE — ED Provider Notes (Signed)
  Physical Exam  BP (!) 160/79 (BP Location: Right Arm)   Pulse 85   Temp 97.6 F (36.4 C) (Axillary)   Resp 18   Ht 6' (1.829 m)   Wt 66.2 kg   SpO2 97%   BMI 19.79 kg/m   Physical Exam  ED Course/Procedures   Clinical Course as of Feb 10 1500  Wed Feb 11, 2020  1145 CT head and CT C-spine without any acute abnormalities.  Agree of radiology read.  Informed patient and his daughter of the pulmonary nodule that was redemonstrated on imaging today.  Follow-up in 12 months.   [WF]  1222 Discussed with daughter at bedside she does not feel the patient can go home to [facility] given that this is a assisted living facility but has no ability to escalate care.  Discussed with case Freight forwarder.  Patient may possibly be placed today if PT eval is obtained.  PT consulted.  Basic labs collected for medical clearance  Patient was assessed by attending physician Dr. Ronnald Nian. Plan is to facilitate SNF placement. Home meds ordered by Dr. Ronnald Nian. Edwin Cap of case mgmt is aware of pt   [WF]    Clinical Course User Index [WF] Tedd Sias, [place]    Procedures  MDM  Patient going to nursing home.      Davonna Belling, MD 02/11/20 1501

## 2020-02-11 NOTE — Evaluation (Signed)
Physical Therapy Evaluation Patient Details Name: Willie [name] MRN: [id number] DOB: 07/13/1940 Today's Date: 02/11/2020   History of Present Illness  Patient is a 79 y.o. male with history of dementia, hypertension who presents the ED after an unwitnessed fall at facility. Patient's daughter reports he has been worsening with mobility and dementia over the last 2 weeks but typically he is ambulatory with walker.    Clinical Impression  Willie [name] is 79 y.o. male admitted with above HPI and diagnosis. Patient is currently limited by functional impairments below (see PT problem list). Patient resides at ALF and is modified independent with RW at baseline. Family reports over the last ~2 weeks he has had a decline in mobility and cognition and increased falls. He required mod assist for bed mobility today and min assist for transfers and gait with RW to prevent LOB. Patient has tendency to move walker too far ahead increasing risk for falls. Patient will benefit from continued skilled PT interventions to address impairments and progress independence with mobility, recommending SNF follow up with 24/7 assist. Acute PT will follow and progress as able.     Follow Up Recommendations SNF;Supervision/Assistance - 24 hour    Equipment Recommendations  None recommended by PT    Recommendations for Other Services       Precautions / Restrictions Precautions Precautions: Fall Restrictions Weight Bearing Restrictions: No      Mobility  Bed Mobility Overal bed mobility: Needs Assistance Bed Mobility: Supine to Sit;Sit to Supine     Supine to sit: Mod assist;HOB elevated Sit to supine: Min assist;HOB elevated   General bed mobility comments: Mod assist requried to initiate bringing LE's off EOB and to raise trunk upright. Mod assist to scoot to EOB and sit with feet on floor. Pt required min assist to raise LE's onto stretcher and mod assist to reposition in supine.   Transfers Overall  transfer level: Needs assistance Equipment used: Rolling walker (2 wheeled) Transfers: Sit to/from Stand Sit to Stand: Min assist;+2 safety/equipment;From elevated surface         General transfer comment: Cues for hand placement on RW, pt with feet slightly anterior initially and cues provided to reposition them under hip to improve posture and balance.  Ambulation/Gait Ambulation/Gait assistance: Min assist;+2 safety/equipment Gait Distance (Feet): 70 Feet Assistive device: Rolling walker (2 wheeled) Gait Pattern/deviations: Step-through pattern;Decreased step length - right;Decreased step length - left;Decreased stride length;Trunk flexed;Narrow base of support Gait velocity: decr   General Gait Details: pt required repeated cues to maintain safe proximity to RW as he had tendency to push it too far ahead. Min assist needed to steady throughout to prevent LOB and to slow walker down to keep in safe position.   Stairs            Wheelchair Mobility    Modified Rankin (Stroke Patients Only)       Balance Overall balance assessment: Needs assistance Sitting-balance support: Feet supported;Bilateral upper extremity supported Sitting balance-Leahy Scale: Fair     Standing balance support: During functional activity;Bilateral upper extremity supported Standing balance-Leahy Scale: Poor Standing balance comment: requires UE support and external support to maintain balance              Pertinent Vitals/Pain Pain Assessment: Faces Faces Pain Scale: No hurt Pain Intervention(s): Monitored during session;Repositioned    Home Living Family/patient expects to be discharged to:: Assisted living  Home Equipment: Wheelchair - [employer] - 2 wheels Additional Comments: Pt's family reports he does not have necessary assist needed at ALF, is hoping to get him palced at SNF/[facility].     Prior Function Level of Independence: Independent with  assistive device(s)         Comments: pt declining over last 2 weeks per family and prior to that decline he was Mod Independent with RW for transfers and gait.      Hand Dominance   Dominant Hand: Right    Extremity/Trunk Assessment   Upper Extremity Assessment Upper Extremity Assessment: Generalized weakness    Lower Extremity Assessment Lower Extremity Assessment: Generalized weakness    Cervical / Trunk Assessment Cervical / Trunk Assessment: Kyphotic  Communication   Communication: HOH  Cognition Arousal/Alertness: Awake/alert Behavior During Therapy: WFL for tasks assessed/performed Overall Cognitive Status: History of cognitive impairments - at baseline Area of Impairment: Orientation;Safety/judgement;Problem solving;Following commands;Memory                 Orientation Level: Disoriented to;Situation;Place   Memory: Decreased short-term memory Following Commands: Follows one step commands consistently;Follows one step commands with increased time;Follows multi-step commands inconsistently Safety/Judgement: Decreased awareness of safety   Problem Solving: Slow processing;Decreased initiation;Difficulty sequencing;Requires verbal cues;Requires tactile cues        General Comments      Exercises     Assessment/Plan    PT Assessment Patient needs continued PT services  PT Problem List Decreased strength;Decreased range of motion;Decreased activity tolerance;Decreased balance;Decreased mobility;Decreased knowledge of use of DME;Decreased cognition;Decreased safety awareness       PT Treatment Interventions DME instruction;Gait training;Stair training;Functional mobility training;Therapeutic activities;Therapeutic exercise;Balance training;Cognitive remediation;Patient/family education    PT Goals (Current goals can be found in the Care Plan section)  Acute Rehab PT Goals Patient Stated Goal: get into facility that can provide greater  care/assist PT Goal Formulation: With patient Time For Goal Achievement: 02/18/20 Potential to Achieve Goals: Good    Frequency Min 2X/week   Barriers to discharge           AM-PAC PT "6 Clicks" Mobility  Outcome Measure Help needed turning from your back to your side while in a flat bed without using bedrails?: A Little Help needed moving from lying on your back to sitting on the side of a flat bed without using bedrails?: A Lot Help needed moving to and from a bed to a chair (including a wheelchair)?: A Little Help needed standing up from a chair using your arms (e.g., wheelchair or bedside chair)?: A Little Help needed to walk in hospital room?: A Little Help needed climbing 3-5 steps with a railing? : A Lot 6 Click Score: 16    End of Session Equipment Utilized During Treatment: Gait belt Activity Tolerance: Patient tolerated treatment well Patient left: in bed;with nursing/sitter in room;Other (comment) [place] Crane B (RN across at station)) Nurse Communication: Mobility status PT Visit Diagnosis: Muscle weakness (generalized) (M62.81);Unsteadiness on feet (R26.81);History of falling (Z91.81);Difficulty in walking, not elsewhere classified (R26.2)    Time: 8756-4332 PT Time Calculation (min) (ACUTE ONLY): 23 min   Charges:   PT Evaluation $PT Eval Low Complexity: 1 Low PT Treatments $Gait Training: 8-22 mins        Verner Mould, DPT Acute Rehabilitation Services  Office [phone] Pager [phone]  02/11/2020 1:41 PM

## 2020-02-11 NOTE — TOC Initial Note (Addendum)
Transition of Care [facility]) - Initial/Assessment Note    Patient Details  Name: Willie [name] MRN: [id number] Date of Birth: [birth year]  Transition of Care [facility]) CM/SW Contact:    Erenest Rasher, RN Phone Number: [phone] 02/11/2020, 12:15 PM  Clinical Narrative:                 TOC CM spoke to pt's DIL, Willie [name]. States she was attempted to get him placed in Memory Care at [facility]. States he was having some falls at ALF. She is wanting SNF rehab. Gave permission to create FL2 and fax referral SNF rehab. Pt has received COVID vaccine.   Expected Discharge Plan: Skilled Nursing Facility Barriers to Discharge: Continued Medical Work up   Patient Goals and CMS Choice Patient states their goals for this hospitalization and ongoing recovery are:: wants patient to go SNF rehab CMS Medicare.gov Compare Post Acute Care list provided to:: Patient Represenative (must comment) Willie [name] -daughter in law) Choice offered to / list presented to : Adult Children  Expected Discharge Plan and Services Expected Discharge Plan: Skilled Nursing Facility In-house Referral: Clinical Social Work Discharge Planning Services: CM Consult Post Acute Care Choice: [facility] Living arrangements for the past 2 months: [facility]                                      Prior Living Arrangements/Services Living arrangements for the past 2 months: [place] Lives with:: Facility Resident Patient language and need for interpreter reviewed:: Yes        Need for Family Participation in Patient Care: Yes (Comment) Care giver support system in place?: Yes (comment) Current home services: DME (wheelchair, rollator, bedside commode) Criminal Activity/Legal Involvement Pertinent to Current Situation/Hospitalization: No - Comment as needed  Activities of Daily Living      Permission Sought/Granted Permission sought to share information  with : Case Manager, Facility Sport and exercise psychologist, PCP, Family Supports Permission granted to share information with : Yes, Verbal Permission Granted  Share Information with NAME: Willie [name]  Permission granted to share info w AGENCY: SNF rehab  Permission granted to share info w Relationship: DIL  Permission granted to share info w Contact Information: [phone]  Emotional Assessment       Orientation: : Oriented to Self   Psych Involvement: No (comment)  Admission diagnosis:  fall;hand laceration Patient Active Problem List   Diagnosis Date Noted  . Lung nodule < 6cm on CT 02/10/2020  . Malignant neoplasm of prostate ([place]) 01/21/2019  . Generalized weakness 01/01/2019  . Acute metabolic encephalopathy 07/86/7544  . Controlled type 2 diabetes mellitus with hypoglycemia ([place]) 12/31/2018  . Hypothyroidism 12/31/2018  . Normocytic normochromic anemia 12/31/2018  . Hyponatremia 12/31/2018  . Nuclear sclerotic cataract of both eyes 09/03/2014  . 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 UGIB (upper gastrointestinal bleed) 08/16/2013  . HTN (hypertension) 08/15/2013  . Diabetes type 2, uncontrolled ([place]) 08/15/2013  . HLD (hyperlipidemia) 08/15/2013  . ICH (intracerebral hemorrhage) ([place]) 08/15/2013   PCP:  Josetta Huddle, MD Pharmacy:  No Pharmacies Listed    Social Determinants of Health (SDOH) Interventions    Readmission Risk Interventions No flowsheet data found.

## 2020-02-11 NOTE — Progress Notes (Addendum)
TOC insurance Willie [name] started with [facility], reference  # [id number]. DIL will provide transportation. Called [facility] to cancel transport.   [facility], [place] ED TOC CM [phone]

## 2020-02-11 NOTE — ED Triage Notes (Signed)
Pt came from [facility] green, assisted living facility.   C/c: Fall occurred today unwitnessed. reoccuring falls over the past week A&O to self only  PMH of dementia, stroke, hypothyroid, diabetes  Minor laceration on left hand an abrasion to left knee  CBG: 205

## 2020-02-11 NOTE — NC FL2 (Signed)
[place] LEVEL OF CARE SCREENING TOOL     IDENTIFICATION  Patient Name: Willie [name] Birthdate: 1941/02/01 Sex: male Admission Date (Current Location): 02/11/2020  [facility] and [place] Number:  Herbalist and Address:  [facility],  [place], [place]      Provider Number: [id number]  Attending Physician Name and Address:  Lennice Sites, DO  Relative Name and Phone Number:  Brannon Levene #[phone]    Current Level of Care: Hospital Recommended Level of Care: [place] Prior Approval Number:    Date Approved/Denied:   PASRR Number: [id number]  Discharge Plan: SNF    Current Diagnoses: Patient Active Problem List   Diagnosis Date Noted  . Lung nodule < 6cm on CT 02/10/2020  . Malignant neoplasm of prostate ([place]) 01/21/2019  . Generalized weakness 01/01/2019  . Acute metabolic encephalopathy 70/96/2836  . Controlled type 2 diabetes mellitus with hypoglycemia ([place]) 12/31/2018  . Hypothyroidism 12/31/2018  . Normocytic normochromic anemia 12/31/2018  . Hyponatremia 12/31/2018  . Nuclear sclerotic cataract of both eyes 09/03/2014  . 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 UGIB (upper gastrointestinal bleed) 08/16/2013  . HTN (hypertension) 08/15/2013  . Diabetes type 2, uncontrolled ([place]) 08/15/2013  . HLD (hyperlipidemia) 08/15/2013  . ICH (intracerebral hemorrhage) ([place]) 08/15/2013    Orientation RESPIRATION BLADDER Height & Weight     Self  Normal Incontinent Weight: 66.2 kg Height:  6' (182.9 cm)  BEHAVIORAL SYMPTOMS/MOOD NEUROLOGICAL BOWEL NUTRITION STATUS      Continent Diet Carb Modified  AMBULATORY STATUS COMMUNICATION OF NEEDS Skin   Extensive Assist Verbally Normal                       Personal Care Assistance Level of Assistance  Feeding Bathing Assistance: Maximum assistance Feeding assistance: Limited assistance Dressing Assistance: Limited assistance      Functional Limitations Info  Sight, Speech, Hearing Sight Info: Impaired Hearing Info: Impaired (hearing aide) Speech Info: Impaired    SPECIAL CARE FACTORS FREQUENCY  PT (By licensed PT), OT (By licensed OT)     PT Frequency: 5x per week OT Frequency: 5x per week            Contractures Contractures Info: Not present    Additional Factors Info  Code Status, Allergies Code Status Info: Full Code Allergies Info: Aspirin           Current Medications (02/11/2020):  This is the current hospital active medication list Current Facility-Administered Medications  Medication Dose Route Frequency Provider Last Rate Last Admin  . acetaminophen (TYLENOL) tablet 650 mg  650 mg Oral Q6H PRN Curatolo, Adam, DO      . atorvastatin (LIPITOR) tablet 10 mg  10 mg Oral Daily Curatolo, Adam, DO      . baclofen (LIORESAL) tablet 10 mg  10 mg Oral TID Curatolo, Adam, DO      . divalproex (DEPAKOTE ER) 24 hr tablet 250 mg  250 mg Oral Daily Curatolo, Adam, DO      . escitalopram (LEXAPRO) tablet 20 mg  20 mg Oral Daily Curatolo, Adam, DO      . levETIRAcetam (KEPPRA) tablet 500 mg  500 mg Oral BID Curatolo, Adam, DO      . [START ON 02/12/2020] levothyroxine (SYNTHROID) tablet 137 mcg  137 mcg Oral Q0600 Curatolo, Adam, DO      . Memantine HCl-Donepezil HCl 28-10 MG CP24 1 capsule  1 capsule Oral  Daily Curatolo, Adam, DO      . metFORMIN (GLUCOPHAGE) tablet 500 mg  500 mg Oral 1 day or 1 dose Curatolo, Adam, DO      . mirabegron ER (MYRBETRIQ) tablet 50 mg  50 mg Oral Daily Curatolo, Adam, DO      . pantoprazole (PROTONIX) EC tablet 40 mg  40 mg Oral QHS Curatolo, Adam, DO      . traZODone (DESYREL) tablet 100 mg  100 mg Oral QHS Curatolo, Adam, DO       Current Outpatient Medications  Medication Sig Dispense Refill  . acetaminophen (TYLENOL) 325 MG tablet Take 650 mg by mouth every 6 (six) hours as needed for moderate pain.    Marland Kitchen atorvastatin (LIPITOR) 10 MG tablet Take 10 mg by mouth daily.      . baclofen (LIORESAL) 10 MG tablet Take 10 mg by mouth 3 (three) times daily.    . divalproex (DEPAKOTE ER) 250 MG 24 hr tablet Take 1 tablet (250 mg total) by mouth daily. 30 tablet 11  . escitalopram (LEXAPRO) 20 MG tablet Take 1 tablet (20 mg total) by mouth daily. 90 tablet 3  . lactose free nutrition (BOOST PLUS) LIQD Take 237 mLs by mouth 2 (two) times daily between meals.    . levETIRAcetam (KEPPRA) 500 MG tablet Take 1 tablet (500 mg total) by mouth 2 (two) times daily. 60 tablet 0  . levothyroxine (SYNTHROID) 137 MCG tablet Take 137 mcg by mouth daily before breakfast.    . LORazepam (ATIVAN) 1 MG tablet Give 1 tablet as needed for abnormal movements lasting more than 15 minutes. Do not give more than 2-3 times a week 10 tablet 5  . Memantine HCl-Donepezil HCl (NAMZARIC) 28-10 MG CP24 Take 1 capsule by mouth daily. 3 capsule 0  . metFORMIN (GLUCOPHAGE) 500 MG tablet Take 500 mg by mouth 1 day or 1 dose.    . mirabegron ER (MYRBETRIQ) 50 MG TB24 tablet Take 50 mg by mouth daily.     . pantoprazole (PROTONIX) 40 MG tablet Take 1 tablet (40 mg total) by mouth at bedtime. 30 tablet   . polyethylene glycol (MIRALAX / GLYCOLAX) 17 g packet Take 17 g by mouth daily as needed for moderate constipation.    Marland Kitchen Propylene Glycol (SYSTANE BALANCE) 0.6 % SOLN Apply to eye every 4 (four) hours.    . Sennosides (EX-LAX) 15 MG CHEW Chew by mouth as needed.    . traZODone (DESYREL) 100 MG tablet Take 1 tablet (100 mg total) by mouth at bedtime. 30 tablet 0  . White Petrolatum-Mineral Oil (GENTEAL TEARS NIGHT-TIME) OINT Apply to eye in the morning and at bedtime.       Discharge Medications: Please see discharge summary for a list of discharge medications.  Relevant Imaging Results:  Relevant Lab Results:   Additional Information ssn: [id number]  Erenest Rasher, RN

## 2020-02-11 NOTE — ED Notes (Addendum)
Physical therapist @ bedside. Daughter notified that PT came and saw the pt. Ambulated with walker down the hall. PT stated that she is going to recommend SNF placement.

## 2020-02-11 NOTE — ED Provider Notes (Signed)
[place] DEPT Provider Note   CSN: [id number] Arrival date & time: 02/11/20  0947     History Chief Complaint  Patient presents with  . Fall    Willie [name] is a 79 y.o. male.  HPI  Patient is an 79 year old male presents emergency department today after fall.  He has had frequent falls over the past week was seen yesterday at [facility], ER and discharged after reassuring work-up.  Patient does have a history of dementia.  His daughter at bedside is able to give more in-depth history.  Per daughter patient has over the past several months progressive worsening in his mental status.  He has had no sharp declines in mental status over the past 24 to 48 hours she states that he walks with a walker at home is able to get around however she has noticed that he seems more unstable.  Patient has had a prior stroke with chronic right facial droop and some right-sided weakness.  He is currently denying any pain denies any chest pain, shortness of breath, nausea, vomiting abdominal pain lightheadedness or dizziness.  He states he slipped on wet floor.  He does live currently at [facility] in the assisted living area.     Past Medical History:  Diagnosis Date  . Alcohol abuse   . Benign prostatic hypertrophy   . Dementia [facility])    daughter-in-law does not know of this   . Diabetes mellitus   . Encephalopathy   . GERD (gastroesophageal reflux disease)   . History of tarsorrhaphy    right eye  . Hypertension   . Hypothyroidism   . Hypothyroidism   . Pneumonia   . Prostate cancer ([place])   . Seizures ([place])   . Stroke [facility])    right eye does not shut all the way-tarsorrhaphy    Patient Active Problem List   Diagnosis Date Noted  . Lung nodule < 6cm on CT 02/10/2020  . Malignant neoplasm of prostate ([place]) 01/21/2019  . Generalized weakness 01/01/2019  . Acute metabolic encephalopathy 70/17/7939  . Controlled type 2 diabetes mellitus with  hypoglycemia ([place]) 12/31/2018  . Hypothyroidism 12/31/2018  . Normocytic normochromic anemia 12/31/2018  . Hyponatremia 12/31/2018  . Nuclear sclerotic cataract of both eyes 09/03/2014  . 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 UGIB (upper gastrointestinal bleed) 08/16/2013  . HTN (hypertension) 08/15/2013  . Diabetes type 2, uncontrolled ([place]) 08/15/2013  . HLD (hyperlipidemia) 08/15/2013  . ICH (intracerebral hemorrhage) ([place]) 08/15/2013    Past Surgical History:  Procedure Laterality Date  . COLONOSCOPY WITH PROPOFOL N/A 08/24/2014   Procedure: COLONOSCOPY WITH PROPOFOL;  Surgeon: Garlan Fair, MD;  Location: [facility] ENDOSCOPY;  Service: Endoscopy;  Laterality: N/A;  . EYE SURGERY     Laser eye surgery  . GOLD SEED IMPLANT N/A 04/18/2019   Procedure: GOLD SEED IMPLANT;  Surgeon: Kathie Rhodes, MD;  Location: [facility] ORS;  Service: Urology;  Laterality: N/A;  . PROSTATE BIOPSY    . SPACE OAR INSTILLATION N/A 04/18/2019   Procedure: SPACE OAR INSTILLATION;  Surgeon: Kathie Rhodes, MD;  Location: [facility] ORS;  Service: Urology;  Laterality: N/A;       Family History  Problem Relation Age of Onset  . Prostate cancer Brother   . Colon cancer Brother   . Breast cancer Neg Hx   . Pancreatic cancer Neg Hx     Social History   Tobacco Use  . Smoking status: Never Smoker  .  Smokeless tobacco: Never Used  Vaping Use  . Vaping Use: Never used  Substance Use Topics  . Alcohol use: Not Currently  . Drug use: No    Home Medications Prior to Admission medications   Medication Sig Start Date End Date Taking? Authorizing Provider  acetaminophen (TYLENOL) 325 MG tablet Take 650 mg by mouth every 6 (six) hours as needed for moderate pain.    [provider]  atorvastatin (LIPITOR) 10 MG tablet Take 10 mg by mouth daily.     [provider]  baclofen (LIORESAL) 10 MG tablet Take 10 mg by mouth 3 (three) times daily. 06/14/19   [provider]  divalproex  (DEPAKOTE ER) 250 MG 24 hr tablet Take 1 tablet (250 mg total) by mouth daily. 02/03/20   Cameron Sprang, MD  escitalopram (LEXAPRO) 20 MG tablet Take 1 tablet (20 mg total) by mouth daily. 01/06/20   Cameron Sprang, MD  lactose free nutrition (BOOST PLUS) LIQD Take 237 mLs by mouth 2 (two) times daily between meals.    [provider]  levETIRAcetam (KEPPRA) 500 MG tablet Take 1 tablet (500 mg total) by mouth 2 (two) times daily. 01/30/20   Cameron Sprang, MD  levothyroxine (SYNTHROID) 137 MCG tablet Take 137 mcg by mouth daily before breakfast.    [provider]  LORazepam (ATIVAN) 1 MG tablet Give 1 tablet as needed for abnormal movements lasting more than 15 minutes. Do not give more than 2-3 times a week 02/03/20   Cameron Sprang, MD  Memantine HCl-Donepezil HCl [facility]) 28-10 MG CP24 Take 1 capsule by mouth daily. 08/26/19   Nita Sells, MD  metFORMIN (GLUCOPHAGE) 500 MG tablet Take 500 mg by mouth 1 day or 1 dose.    [provider]  mirabegron ER (MYRBETRIQ) 50 MG TB24 tablet Take 50 mg by mouth daily.     [provider]  pantoprazole (PROTONIX) 40 MG tablet Take 1 tablet (40 mg total) by mouth at bedtime. 08/26/19   Nita Sells, MD  polyethylene glycol (MIRALAX / GLYCOLAX) 17 g packet Take 17 g by mouth daily as needed for moderate constipation.    [provider]  Propylene Glycol (SYSTANE BALANCE) 0.6 % SOLN Apply to eye every 4 (four) hours.    [provider]  Sennosides (EX-LAX) 15 MG CHEW Chew by mouth as needed.    [provider]  traZODone (DESYREL) 100 MG tablet Take 1 tablet (100 mg total) by mouth at bedtime. 08/26/19   Nita Sells, MD  White Petrolatum-Mineral Oil (GENTEAL TEARS NIGHT-TIME) OINT Apply to eye in the morning and at bedtime.    [provider]    Allergies    Aspirin  Review of Systems   Review of Systems  Constitutional: Negative for chills and fever.  HENT:  Negative for congestion.   Eyes: Negative for pain.  Respiratory: Negative for cough and shortness of breath.   Cardiovascular: Negative for chest pain and leg swelling.  Gastrointestinal: Negative for abdominal pain and vomiting.  Genitourinary: Negative for dysuria.  Musculoskeletal: Negative for myalgias.       Neck pain  Skin: Negative for rash.  Neurological: Negative for dizziness and headaches.    Physical Exam Updated Vital Signs BP (!) 157/79 (BP Location: Right Arm)   Pulse 88   Temp 97.6 F (36.4 C) (Oral)   Resp 18   Ht 6' (1.829 m)   Wt 66.2 kg   SpO2 96%  BMI 19.79 kg/m   Physical Exam Vitals and nursing note reviewed.  Constitutional:      General: He is not in acute distress. HENT:     Head: Normocephalic and atraumatic.     Nose: Nose normal.     Mouth/Throat:     Mouth: Mucous membranes are moist.  Eyes:     General: No scleral icterus.    Extraocular Movements: Extraocular movements intact.     Pupils: Pupils are equal, round, and reactive to light.  Neck:     Comments: Cervical collar in place. Cardiovascular:     Rate and Rhythm: Normal rate and regular rhythm.     Pulses: Normal pulses.     Heart sounds: Normal heart sounds.  Pulmonary:     Effort: Pulmonary effort is normal. No respiratory distress.     Breath sounds: No wheezing.  Abdominal:     Palpations: Abdomen is soft.     Tenderness: There is no abdominal tenderness.  Musculoskeletal:     Right lower leg: No edema.     Left lower leg: No edema.  Skin:    General: Skin is warm and dry.     Capillary Refill: Capillary refill takes less than 2 seconds.     Comments: Superficial abrasions to the hands.  Left hand there is a left radial thumb.  No gaping lacerations or repairable injuries. Abrasions are non-bleeding.  Neurological:     Mental Status: He is alert. Mental status is at baseline.     Comments: Alert and oriented to self, place, time somewhat questionable orientation to  events  Speech is fluent, clear without dysarthria or dysphasia.   Strength 5/5 in upper/lower extremities  Sensation intact in upper/lower extremities   Normal gait.  Negative Romberg. No pronator drift.  Normal finger-to-nose and feet tapping.  CN I not tested  CN II grossly intact visual fields bilaterally. Did not visualize posterior eye.   CN III, IV, VI PERRLA and EOMs intact bilaterally  CN V Intact sensation to sharp and light touch to the face  CN VII facial movements with baseline asymmetry  CN VIII not tested  CN IX, X no uvula deviation, symmetric rise of soft palate  CN XI 5/5 SCM and trapezius strength bilaterally  CN XII Midline tongue protrusion, symmetric L/R movements   Psychiatric:        Mood and Affect: Mood normal.        Behavior: Behavior normal.     ED Results / Procedures / Treatments   Labs (all labs ordered are listed, but only abnormal results are displayed) Labs Reviewed  COMPREHENSIVE METABOLIC PANEL - Abnormal; Notable for the following components:      Result Value   Glucose, Bld 212 (*)    BUN 55 (*)    Total Protein 8.2 (*)    GFR calc non Af Amer 55 (*)    All other components within normal limits  CBC WITH DIFFERENTIAL/PLATELET - Abnormal; Notable for the following components:   RBC 4.13 (*)    Hemoglobin 12.5 (*)    Neutro Abs 8.1 (*)    All other components within normal limits  CBG MONITORING, ED - Abnormal; Notable for the following components:   Glucose-Capillary 197 (*)    All other components within normal limits  SARS CORONAVIRUS 2 BY RT PCR (HOSPITAL ORDER, [place] [facility])    EKG None  Radiology CT Head Wo Contrast  Result Date: 02/11/2020 CLINICAL  DATA:  Head trauma, moderate/severe. Neck trauma. Additional provided: Recurrent falls in the past week. EXAM: CT HEAD WITHOUT CONTRAST CT CERVICAL SPINE WITHOUT CONTRAST TECHNIQUE: Multidetector CT imaging of the head and cervical spine was performed  following the standard protocol without intravenous contrast. Multiplanar CT image reconstructions of the cervical spine were also generated. COMPARISON:  CT head/cervical spine 02/10/2020. FINDINGS: CT HEAD FINDINGS Brain: Stable moderate generalized parenchymal atrophy and mild chronic small vessel ischemic disease. There is no acute intracranial hemorrhage. No demarcated cortical infarct. No extra-axial fluid collection. No evidence of intracranial mass. No midline shift. Vascular: No hyperdense vessel.  Atherosclerotic calcifications Skull: Normal. Negative for fracture or focal lesion. Sinuses/Orbits: Visualized orbits show no acute finding. No significant paranasal sinus disease or mastoid effusion at the imaged levels CT CERVICAL SPINE FINDINGS Alignment: Straightening of the expected cervical lordosis. No significant spondylolisthesis Skull base and vertebrae: The basion-dental and atlanto-dental intervals are maintained.No evidence of acute fracture to the cervical spine. Soft tissues and spinal canal: Choose were Disc levels: Cervical spondylosis with multilevel disc space narrowing, disc bulges, uncovertebral and facet hypertrophy. Additionally, there is a posterior disc osteophyte complex at C5-C6. Upper chest: Redemonstrated 4 mm right upper lobe pulmonary nodule (series 4, image 103). No visible pneumothorax. Other: Atherosclerotic calcifications within the bilateral carotid bifurcations. IMPRESSION: CT head: 1. No evidence of acute intracranial abnormality. 2. Stable moderate generalized parenchymal atrophy and mild chronic small vessel ischemic disease. CT cervical spine: 1. No evidence of acute fracture to the cervical spine. 2. Cervical spondylosis as described. 3. Redemonstrated 4 mm right upper lobe pulmonary nodule. No follow-up needed if patient is low-risk. Non-contrast chest CT can be considered in 12 months if patient is high-risk. This recommendation follows the consensus statement:  Guidelines for Management of Incidental Pulmonary Nodules Detected on CT Images: From the Fleischner Society 2017; Radiology 2017; 284:228-243. Electronically Signed   By: Kellie Simmering DO   On: 02/11/2020 11:31   CT Cervical Spine Wo Contrast  Result Date: 02/11/2020 CLINICAL DATA:  Head trauma, moderate/severe. Neck trauma. Additional provided: Recurrent falls in the past week. EXAM: CT HEAD WITHOUT CONTRAST CT CERVICAL SPINE WITHOUT CONTRAST TECHNIQUE: Multidetector CT imaging of the head and cervical spine was performed following the standard protocol without intravenous contrast. Multiplanar CT image reconstructions of the cervical spine were also generated. COMPARISON:  CT head/cervical spine 02/10/2020. FINDINGS: CT HEAD FINDINGS Brain: Stable moderate generalized parenchymal atrophy and mild chronic small vessel ischemic disease. There is no acute intracranial hemorrhage. No demarcated cortical infarct. No extra-axial fluid collection. No evidence of intracranial mass. No midline shift. Vascular: No hyperdense vessel.  Atherosclerotic calcifications Skull: Normal. Negative for fracture or focal lesion. Sinuses/Orbits: Visualized orbits show no acute finding. No significant paranasal sinus disease or mastoid effusion at the imaged levels CT CERVICAL SPINE FINDINGS Alignment: Straightening of the expected cervical lordosis. No significant spondylolisthesis Skull base and vertebrae: The basion-dental and atlanto-dental intervals are maintained.No evidence of acute fracture to the cervical spine. Soft tissues and spinal canal: Choose were Disc levels: Cervical spondylosis with multilevel disc space narrowing, disc bulges, uncovertebral and facet hypertrophy. Additionally, there is a posterior disc osteophyte complex at C5-C6. Upper chest: Redemonstrated 4 mm right upper lobe pulmonary nodule (series 4, image 103). No visible pneumothorax. Other: Atherosclerotic calcifications within the bilateral carotid  bifurcations. IMPRESSION: CT head: 1. No evidence of acute intracranial abnormality. 2. Stable moderate generalized parenchymal atrophy and mild chronic small vessel ischemic disease. CT cervical spine: 1. No  evidence of acute fracture to the cervical spine. 2. Cervical spondylosis as described. 3. Redemonstrated 4 mm right upper lobe pulmonary nodule. No follow-up needed if patient is low-risk. Non-contrast chest CT can be considered in 12 months if patient is high-risk. This recommendation follows the consensus statement: Guidelines for Management of Incidental Pulmonary Nodules Detected on CT Images: From the Fleischner Society 2017; Radiology 2017; 284:228-243. Electronically Signed   By: Kellie Simmering DO   On: 02/11/2020 11:31    Procedures Procedures (including critical care time)  Medications Ordered in ED Medications - No data to display  ED Course  I have reviewed the triage vital signs and the nursing notes.  Pertinent labs & imaging results that were available during my care of the patient were reviewed by me and considered in my medical decision making (see chart for details).  Patient is 79 year old male with history of frequent falls and dementia presented today for fall.  He is in cervical collar currently.  He denies any pain anywhere however does endorse some neck pain when asked specifically about this.  Patient assessed with daughter at bedside who confirms that his facial droop is chronic and that his mental status is at baseline.  Discussed with transitional care team for SNF placement.  Per daughter she has been unable to place patient in SNF because of confusion confusion with paperwork.  PT eval ordered and they are recommending SNF placement.  Clinical Course as of Feb 13 939  Wed Feb 11, 2020  1145 CT head and CT C-spine without any acute abnormalities.  Agree of radiology read.  Informed patient and his daughter of the pulmonary nodule that was redemonstrated on  imaging today.  Follow-up in 12 months.   [WF]  1222 Discussed with daughter at bedside she does not feel the patient can go home to [facility] given that this is a assisted living facility but has no ability to escalate care.  Discussed with case Freight forwarder.  Patient may possibly be placed today if PT eval is obtained.  PT consulted.  Basic labs collected for medical clearance  Patient was assessed by attending physician Dr. Ronnald Nian. Plan is to facilitate SNF placement. Home meds ordered by Dr. Ronnald Nian. Edwin Cap of case mgmt is aware of pt   [WF]    Clinical Course User Index [WF] Tedd Sias, [place]   Blood work shows no acute abnormalities.  Mildly elevated BUN with elevated glucose but no other acute abnormality.  Patient with some p.o. fluid which she was able to tolerate.  Covid test is negative.  CBC without leukocytosis or significant anemia.  MDM Rules/Calculators/A&P                          Patient is medically clear for SNF placement.  This should be completed later today.  Final Clinical Impression(s) / ED Diagnoses Final diagnoses:  Fall, initial encounter  Dementia without behavioral disturbance, unspecified dementia type [facility])    Rx / DC Orders ED Discharge Orders    None       Tedd Sias, [place] 02/13/20 0940    Lennice Sites, DO 02/14/20 1846

## 2020-02-11 NOTE — Progress Notes (Addendum)
TOC CM provided pt's DIL with facilities that accepted. She wanted CM to follow up with [facility], and [facility]. Waiting for call back. Jonnie Finner RN CCM, [facility] ED TOC CM [phone]  [facility] accepted referral, spoke to [place]. Call report to [phone] room 602B. Daughter plans to transport pt to facility.  [place], [place] ED TOC CM [phone]

## 2020-02-11 NOTE — ED Provider Notes (Addendum)
Medical screening examination/treatment/procedure(s) were conducted as a shared visit with non-physician practitioner(s) and myself.  I personally evaluated the patient during the encounter. Briefly, the patient is a 79 y.o. male with history of dementia, hypertension who presents the ED after a fall.  Patient with prior stroke with chronic right facial droop.  Supposedly slipped on a wet floor today.  Unsure if he hit his head or not.  He denies any pain.  Appears to move all extremities without any issues.  We will get a CT scan of head and neck.  Overall he appears well.  Will contact social work to make sure that he has more adequate resources as it appears that he mostly lives in assisted living facility.  No obvious tenderness to extremities.  Will hold off on any x-rays at this time.    We will get basic labs and see if there is any reason for his recent falls except for the decline and worsening dementia.  Talked with family and patient currently lives at a skilled nursing facility where they are not able to offer him any more help.  Social work has been involved as well and we will attempt placement from the ED as patient is not safe to go back to his facility.  Multiple falls this past week and overall suspect decline in mental status is due to worsening dementia but will get further medical clearance.  We will have physical therapy evaluate patient.  This chart was dictated using voice recognition software.  Despite best efforts to proofread,  errors can occur which can change the documentation meaning.     EKG Interpretation None           Lennice Sites, DO 02/11/20 [place], Constantin Hillery, DO 02/11/20 1203

## 2020-02-11 NOTE — ED Notes (Signed)
Pt discharged safely with Daughter in law.  Patient was cleaned up dressed and pushed out and assisted to car.  Discharge instructions were reviewed and report was called to snif.

## 2020-02-27 ENCOUNTER — Other Ambulatory Visit: Payer: Self-pay | Admitting: Internal Medicine

## 2020-02-27 DIAGNOSIS — F0390 Unspecified dementia without behavioral disturbance: Secondary | ICD-10-CM

## 2020-02-27 DIAGNOSIS — R413 Other amnesia: Secondary | ICD-10-CM

## 2020-02-27 DIAGNOSIS — F444 Conversion disorder with motor symptom or deficit: Secondary | ICD-10-CM

## 2020-03-10 ENCOUNTER — Other Ambulatory Visit: Payer: Medicare Other

## 2020-04-12 ENCOUNTER — Emergency Department (HOSPITAL_COMMUNITY): Payer: Medicare Other

## 2020-04-12 ENCOUNTER — Emergency Department (HOSPITAL_COMMUNITY)
Admission: EM | Admit: 2020-04-12 | Discharge: 2020-04-12 | Disposition: A | Discharge status: Home / Self Care | Payer: Medicare Other | Attending: Emergency Medicine | Admitting: Emergency Medicine

## 2020-04-12 ENCOUNTER — Other Ambulatory Visit: Payer: Self-pay

## 2020-04-12 DIAGNOSIS — F0150 Vascular dementia without behavioral disturbance: Secondary | ICD-10-CM | POA: Insufficient documentation

## 2020-04-12 DIAGNOSIS — Z79899 Other long term (current) drug therapy: Secondary | ICD-10-CM | POA: Diagnosis not present

## 2020-04-12 DIAGNOSIS — E1165 Type 2 diabetes mellitus with hyperglycemia: Secondary | ICD-10-CM | POA: Insufficient documentation

## 2020-04-12 DIAGNOSIS — Z8673 Personal history of transient ischemic attack (TIA), and cerebral infarction without residual deficits: Secondary | ICD-10-CM | POA: Insufficient documentation

## 2020-04-12 DIAGNOSIS — R29810 Facial weakness: Secondary | ICD-10-CM | POA: Diagnosis present

## 2020-04-12 DIAGNOSIS — Z8546 Personal history of malignant neoplasm of prostate: Secondary | ICD-10-CM | POA: Diagnosis not present

## 2020-04-12 DIAGNOSIS — E039 Hypothyroidism, unspecified: Secondary | ICD-10-CM | POA: Insufficient documentation

## 2020-04-12 DIAGNOSIS — I10 Essential (primary) hypertension: Secondary | ICD-10-CM | POA: Diagnosis not present

## 2020-04-12 LAB — DIFFERENTIAL
Abs Immature Granulocytes: 0.01 10*3/uL (ref 0.00–0.07)
Basophils Absolute: 0 10*3/uL (ref 0.0–0.1)
Basophils Relative: 1 %
Eosinophils Absolute: 0 10*3/uL (ref 0.0–0.5)
Eosinophils Relative: 1 %
Immature Granulocytes: 0 %
Lymphocytes Relative: 32 %
Lymphs Abs: 1.4 10*3/uL (ref 0.7–4.0)
Monocytes Absolute: 0.4 10*3/uL (ref 0.1–1.0)
Monocytes Relative: 9 %
Neutro Abs: 2.6 10*3/uL (ref 1.7–7.7)
Neutrophils Relative %: 57 %

## 2020-04-12 LAB — I-STAT CHEM 8, ED
BUN: 28 mg/dL — ABNORMAL HIGH (ref 8–23)
Calcium, Ion: 0.84 mmol/L — CL (ref 1.15–1.40)
Chloride: 107 mmol/L (ref 98–111)
Creatinine, Ser: 1.2 mg/dL (ref 0.61–1.24)
Glucose, Bld: 80 mg/dL (ref 70–99)
HCT: 37 % — ABNORMAL LOW (ref 39.0–52.0)
Hemoglobin: 12.6 g/dL — ABNORMAL LOW (ref 13.0–17.0)
Potassium: 4 mmol/L (ref 3.5–5.1)
Sodium: 138 mmol/L (ref 135–145)
TCO2: 21 mmol/L — ABNORMAL LOW (ref 22–32)

## 2020-04-12 LAB — CBC
HCT: 37.5 % — ABNORMAL LOW (ref 39.0–52.0)
Hemoglobin: 11.3 g/dL — ABNORMAL LOW (ref 13.0–17.0)
MCH: 29.7 pg (ref 26.0–34.0)
MCHC: 30.1 g/dL (ref 30.0–36.0)
MCV: 98.4 fL (ref 80.0–100.0)
Platelets: 216 10*3/uL (ref 150–400)
RBC: 3.81 MIL/uL — ABNORMAL LOW (ref 4.22–5.81)
RDW: 14.2 % (ref 11.5–15.5)
WBC: 4.4 10*3/uL (ref 4.0–10.5)
nRBC: 0 % (ref 0.0–0.2)

## 2020-04-12 LAB — COMPREHENSIVE METABOLIC PANEL
ALT: 11 U/L (ref 0–44)
AST: 16 U/L (ref 15–41)
Albumin: 3.4 g/dL — ABNORMAL LOW (ref 3.5–5.0)
Alkaline Phosphatase: 96 U/L (ref 38–126)
Anion gap: 13 (ref 5–15)
BUN: 27 mg/dL — ABNORMAL HIGH (ref 8–23)
CO2: 24 mmol/L (ref 22–32)
Calcium: 8.9 mg/dL (ref 8.9–10.3)
Chloride: 103 mmol/L (ref 98–111)
Creatinine, Ser: 1.2 mg/dL (ref 0.61–1.24)
GFR, Estimated: >60 mL/min (ref >60)
Glucose, Bld: 81 mg/dL (ref 70–99)
Potassium: 4.2 mmol/L (ref 3.5–5.1)
Sodium: 140 mmol/L (ref 135–145)
Total Bilirubin: 0.9 mg/dL (ref 0.3–1.2)
Total Protein: 6.8 g/dL (ref 6.5–8.1)

## 2020-04-12 LAB — CBG MONITORING, ED: Glucose-Capillary: 91 mg/dL (ref 70–99)

## 2020-04-12 LAB — PROTIME-INR
INR: 1 (ref 0.8–1.2)
Prothrombin Time: 13 seconds (ref 11.4–15.2)

## 2020-04-12 LAB — APTT: aPTT: 38 seconds — ABNORMAL HIGH (ref 24–36)

## 2020-04-12 MED ORDER — SODIUM CHLORIDE 0.9% FLUSH: 3.0000 mL | Freq: Once | INTRAVENOUS | Status: DC

## 2020-04-12 MED ORDER — LORAZEPAM 2 MG/ML IJ SOLN
1.0000 mg | Freq: Once | INTRAMUSCULAR | Status: AC
  Administered 2020-04-12: 1 mg via INTRAVENOUS
  Filled 2020-04-12: qty 1

## 2020-04-12 MED ORDER — GADOBUTROL 1 MMOL/ML IV SOLN
6.0000 mL | Freq: Once | INTRAVENOUS | Status: AC | PRN
  Administered 2020-04-12: 6 mL via INTRAVENOUS

## 2020-04-12 NOTE — ED Triage Notes (Signed)
Pt arrived via EMS from [facility]. Facility reports pt was walking to lunch around 1300 and had staff noted slurred speech and right facial droop and weakness. Facility called daughter to confirm slurred speech and then EMS. Hematoma noted to right forehead. Facility did not witness a fall. Pt has a hx of dementia.

## 2020-04-12 NOTE — ED Provider Notes (Signed)
79 yo male presented today with right sided facial droop. No extremity weakness Decreased talkativeness  Physical Exam  BP 138/63 (BP Location: Left Arm)   Pulse 70   Resp (!) 25   Wt 60.4 kg   SpO2 98%   BMI 18.06 kg/m   Physical Exam  ED Course/Procedures     Procedures  MDM  Awaiting neurohospitalist consult Follow up labs, urine, mri 5:29 PM Discussed with daughter-in-law who is at bedside.  She states he had an episode yesterday where he became unresponsive with his eyes open.  She did not note any seizure activity.  It lasted about 2 minutes.  It took him a little while to get back to baseline.  She states he has had several episodes during the past 24 hours where his speech becomes garbled and then normalizes. Patient is normally in memory care unit but is up and ambulatory. Discussed with Dr. Caprice Red reviewed,no evidence of stroke Plan ambulate and possible d/c  Received Ativan for MRI.  Will observe until more awake 7:51 PM Patient awake DIL states he has been up to the sink and walked wihout assistance Discussion about goals of care and she does not wish further work-up at this time.  Plan referral to palliative care She is in agreement with d/c back to Josephine Cables, MD 04/12/20 1956

## 2020-04-12 NOTE — ED Notes (Signed)
Patient transported to MRI 

## 2020-04-12 NOTE — Consult Note (Signed)
Neurology Consultation Reason for Consult: concern for right facial droop  Referring Physician: Elnora Morrison   CC: Altered mental status  History is obtained from: Patient and daughter-in-law Willie [name] as well as chart review  HPI: Willie [name] is a 79 y.o. male with past medical history significant for vascular dementia, psychogenic nonepileptic movements, stroke, hypertension, hypothyroidism, diabetes, prostate cancer, BPH.  On his last admission he had extended EEG monitoring while on Keppra and phenytoin which did not reveal any evidence of epileptiform activity, despite multiple spells of abnormal behaviors captured.  On outpatient follow-up with his neurologist 8/24 (Dr. Delice Lesch), plan was made to wean Keppra and continue Depakote for mood/behavior, but based on documentation from his assisted living facility he appears to have continued on Keppra as well as Depakote.  Notably he has daily falls, including a fall last week where he hit the back of his head and then a fall several days ago where he hit the left side of his head.  He was otherwise at his baseline yesterday when his daughter-in-law Willie [name] took him out of the facility to visit other family members and go for a car ride.  She notes that he had an episode of unresponsiveness for about 2 minutes after which he continued to be sluggish responsive than his normal self (about 50% of his normal baseline).  This morning he was noted by facility staff to be sleepy when they first woke him up at 8 AM and perhaps going to lie flat.  At 1PM they activated EMS for right facial droop and slurred speech.  Per his daughter-in-law, she has been noticing progression of right facial droop for the last 2 months that does appear worse today compared to yesterday.  She reports that at baseline he is more often incontinent than not, but has not had infectious complaints recently.  LKW: 10/31 late afternoon tPA given?: No, due to out of the  window Premorbid modified rankin scale:     4 - Moderately severe disability. Unable to attend to own bodily needs without assistance, and unable to walk unassisted.  ROS: Limited secondary to patient's vascular dementia, but negative as reviewed with daughter-in-law other than systems noted above  Past Medical History:  Diagnosis Date   Alcohol abuse    Benign prostatic hypertrophy    Dementia (HCC)    daughter-in-law does not know of this    Diabetes mellitus    Encephalopathy    GERD (gastroesophageal reflux disease)    History of tarsorrhaphy    right eye   Hypertension    Hypothyroidism    Hypothyroidism    Pneumonia    Prostate cancer ([place])    Seizures ([place])    Stroke ([place])    right eye does not shut all the way-tarsorrhaphy   Past Surgical History:  Procedure Laterality Date   COLONOSCOPY WITH PROPOFOL N/A 08/24/2014   Procedure: COLONOSCOPY WITH PROPOFOL;  Surgeon: Garlan Fair, MD;  Location: [facility];  Service: Endoscopy;  Laterality: N/A;   EYE SURGERY     Laser eye surgery   GOLD SEED IMPLANT N/A 04/18/2019   Procedure: GOLD SEED IMPLANT;  Surgeon: Kathie Rhodes, MD;  Location: [facility];  Service: Urology;  Laterality: N/A;   PROSTATE BIOPSY     SPACE OAR INSTILLATION N/A 04/18/2019   Procedure: SPACE OAR INSTILLATION;  Surgeon: Kathie Rhodes, MD;  Location: [facility];  Service: Urology;  Laterality: N/A;   Current Outpatient Medications  Medication Instructions   acetaminophen (  TYLENOL) 325-650 mg, Oral, See admin instructions, Take 325 mg by mouth in the morning and an additional 650 mg every six hours as needed for pain   Artificial Tear Solution (GENTEAL TEARS) 0.1-0.2-0.3 % SOLN 1 drop, Both Eyes, 2 times daily   atorvastatin (LIPITOR) 10 mg, Oral, Every evening   baclofen (LIORESAL) 10 mg, Oral, 3 times daily   divalproex (DEPAKOTE ER) 250 mg, Oral, Daily   divalproex (DEPAKOTE SPRINKLE) 125 mg, Oral, Every morning    escitalopram (LEXAPRO) 20 mg, Oral, Daily   levETIRAcetam (KEPPRA) 500 mg, Oral, 2 times daily   LORazepam (ATIVAN) 1 MG tablet Give 1 tablet as needed for abnormal movements lasting more than 15 minutes. Do not give more than 2-3 times a week   Memantine HCl-Donepezil HCl (NAMZARIC) 28-10 MG CP24 1 capsule, Oral, Daily   mirabegron ER (MYRBETRIQ) 50 mg, Oral, Daily   neomycin-bacitracin-polymyxin (NEOSPORIN) [phone] ointment 1 application, Topical, See admin instructions, Apply topically to left elbow once daily for open area- clean area first and apply a Band-Aid afterwards   pantoprazole (PROTONIX) 40 mg, Oral, Daily at bedtime   Polyethyl Glycol-Propyl Glycol (SYSTANE) 0.4-0.3 % GEL ophthalmic gel 1 application, Both Eyes, 2 times daily   Polyethyl Glycol-Propyl Glycol (SYSTANE) 7.5-9.1 % SOLN 1 application, Both Eyes, Every 4 hours   polyethylene glycol (MIRALAX / GLYCOLAX) 17 g, Oral, Daily PRN   Sennosides 15 mg, Oral, Daily PRN   Synthroid 150 mcg, Oral, Daily before breakfast   traZODone (DESYREL) 100 mg, Oral, Daily at bedtime     Family History  Problem Relation Age of Onset   Prostate cancer Brother    Colon cancer Brother    Breast cancer Neg Hx    Pancreatic cancer Neg Hx     Social History:  reports that he has never smoked. He has never used smokeless tobacco. He reports previous alcohol use. He reports that he does not use drugs.  Exam: Current vital signs: BP 138/63 (BP Location: Left Arm)    Pulse 70    Resp (!) 25    Wt 60.4 kg    SpO2 98%    BMI 18.06 kg/m  Vital signs in last 24 hours: Pulse Rate:  [70-74] 70 (11/01 1525) Resp:  [15-25] 25 (11/01 1525) BP: (117-138)/(61-63) 138/63 (11/01 1525) SpO2:  [97 %-98 %] 98 % (11/01 1525) Weight:  [60.4 kg] 60.4 kg (11/01 1442)   Physical Exam  Constitutional: Appears well-developed and well-nourished.  Psych: Affect appropriate to situation Eyes: No scleral injection HENT: No OP  obstrucion MSK: no joint deformities.  Cardiovascular: Normal rate and regular rhythm.  Respiratory: Effort normal, non-labored breathing GI: Soft.  No distension. There is no tenderness.  Skin: WDI  Neuro: Mental Status: Patient is awake, alert, oriented to person, age, but not month or situation Patient is able to give limited history but there is no clear aphasia or neglect.  Cranial Nerves: II: Visual Fields are full. Pupils are equal, round, and reactive to light.  III,IV, VI: EOMI without ptosis or diploplia, baseline right eyelid surgery V: Facial sensation is symmetric to light touch VII: Facial movement notable for right nasolabial fold flattening at baseline with symmetric activation VIII: hearing is intact to voice, but he appears somewhat hard of hearing X: Uvula elevates symmetrically XI: Shoulder shrug is symmetric. XII: Tongue is midline without atrophy or fasciculations.  Motor: Tone is notable for significant paratonia. Bulk is normal.  Strength testing somewhat limited by intermittent command  following, but grossly equal strength in all 4 extremities at least 4 out of 5 throughout. At times appears weak, at other times has wild movements, nearly striking examiner Sensory: Sensation is symmetric to light touch and temperature in the arms and legs though he doesn't reliably report left vs. right Deep Tendon Reflexes: Difficult to elicit secondary to the patient not relaxing Plantars: Toes are downgoing bilaterally, does additionally withdrawal briskly Cerebellar: FNF and toe to hand are intact bilaterally When seated to stand for attempted gait assessment, sits at the side of the bed and then rests his head on my arm.   I have reviewed labs in epic and the results pertinent to this consultation are: Creatinine 1.2, blood cell 4.4, hemoglobin 12.6  I have reviewed the images obtained:   Head CT without any acute intracranial process  Impression: This is an  79 year old gentleman with a progressive dementia presenting with worsened mental status and progressive right facial droop and left scalp hematoma.  There is no evidence of a large vessel occlusion on his neurological examination.  Seizures are a possibility but less likely given his recent prolonged EEG which was negative for ictal activity and no change recently in his antiseizure medications (despite plan by outpatient neurologist).  Therefore will not repeat EEG at this time.  Will obtain repeat MRI brain to exclude any acute stroke as well as obtain a contrasted study to assess for any mass given his cachexia and family report of a gradually progressive right facial droop over the last few months.  Recommendations: -MRI brain with and without contrast -Should this study be negative, recommend close outpatient follow-up with neurology  [place] [phone]

## 2020-04-12 NOTE — Code Documentation (Signed)
Stroke Response Nurse Documentation Code Documentation  Willie [name] is a 79 y.o. male arriving to [facility] ED via [facility] on 04/12/2020 with past medical hx of HTN, Diabetes, Dementia, Stroke, Seizure. Code stroke was activated by EMS. Patient from facility [facility] where he was LKW at an unclear time yesterday and now complaining of right facial droop and acting abnormal.   Stroke team at the bedside on patient arrival. Labs drawn and patient cleared for CT by Dr. Reather Converse. Patient to CT with team. NIHSS 4, see documentation for details and code stroke times. Patient with disoriented, right facial droop, Expressive aphasia  and dysarthria  on exam. The following imaging was completed:  CT.  Patient is not a candidate for tPA due to being outside the window. Care/Plan: q2 HOur mNIHSS/VS. Bedside handoff with ED RN Ariel.    Kathrin Greathouse  Stroke Response RN

## 2020-04-12 NOTE — ED Notes (Signed)
Family at bedside verbalizes understanding of discharge instructions. Opportunity for questioning and answers were provided. Pt discharged from ED via wheelchair with family to [facility]. [facility] updated on pts discharge status.

## 2020-04-12 NOTE — ED Provider Notes (Signed)
[facility] EMERGENCY DEPARTMENT Provider Note   CSN: [id number] Arrival date & time: 04/12/20  1421  An emergency department physician performed an initial assessment on this suspected stroke patient at 1425.  History Chief Complaint  Patient presents with  . Code Stroke    Willie [name] is a 79 y.o. male.  Patient with history of dementia, high blood pressure presents with neurologic change around 1 o'clock p.m. this afternoon.  Staff noticed at nursing home he had slurred speech and right facial droop new for the patient.  Patient not as talkative per family member that arrived later.  No witnessed fall.  No fevers or infectious symptoms recently.        Past Medical History:  Diagnosis Date  . Alcohol abuse   . Benign prostatic hypertrophy   . Dementia [facility])    daughter-in-law does not know of this   . Diabetes mellitus   . Encephalopathy   . GERD (gastroesophageal reflux disease)   . History of tarsorrhaphy    right eye  . Hypertension   . Hypothyroidism   . Hypothyroidism   . Pneumonia   . Prostate cancer ([place])   . Seizures ([address])   . Stroke [facility])    right eye does not shut all the way-tarsorrhaphy    Patient Active Problem List   Diagnosis Date Noted  . Lung nodule < 6cm on CT 02/10/2020  . Malignant neoplasm of prostate ([place]) 01/21/2019  . Generalized weakness 01/01/2019  . Acute metabolic encephalopathy 54/00/8676  . Controlled type 2 diabetes mellitus with hypoglycemia ([place]) 12/31/2018  . Hypothyroidism 12/31/2018  . Normocytic normochromic anemia 12/31/2018  . Hyponatremia 12/31/2018  . Nuclear sclerotic cataract of both eyes 09/03/2014  . 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 UGIB (upper gastrointestinal bleed) 08/16/2013  . HTN (hypertension) 08/15/2013  . Diabetes type 2, uncontrolled ([place]) 08/15/2013  . HLD (hyperlipidemia) 08/15/2013  . ICH (intracerebral hemorrhage) ([place]) 08/15/2013    Past Surgical  History:  Procedure Laterality Date  . COLONOSCOPY WITH PROPOFOL N/A 08/24/2014   Procedure: COLONOSCOPY WITH PROPOFOL;  Surgeon: Garlan Fair, MD;  [facility];  Service: Endoscopy;  Laterality: N/A;  . EYE SURGERY     Laser eye surgery  . GOLD SEED IMPLANT N/A 04/18/2019   Procedure: GOLD SEED IMPLANT;  Surgeon: Kathie Rhodes, MD;  [facility];  Service: Urology;  Laterality: N/A;  . PROSTATE BIOPSY    . SPACE OAR INSTILLATION N/A 04/18/2019   Procedure: SPACE OAR INSTILLATION;  Surgeon: Kathie Rhodes, MD;  Location[facility];  Service: Urology;  Laterality: N/A;       Family History  Problem Relation Age of Onset  . Prostate cancer Brother   . Colon cancer Brother   . Breast cancer Neg Hx   . Pancreatic cancer Neg Hx     Social History   Tobacco Use  . Smoking status: Never Smoker  . Smokeless tobacco: Never Used  Vaping Use  . Vaping Use: Never used  Substance Use Topics  . Alcohol use: Not Currently  . Drug use: No    Home Medications Prior to Admission medications   Medication Sig Start Date End Date Taking? Authorizing Provider  acetaminophen (TYLENOL) 325 MG tablet Take 325-650 mg by mouth See admin instructions. Take 325 mg by mouth in the morning and an additional 650 mg every six hours as needed for pain   Yes [provider]  Artificial Tear Solution (GENTEAL  TEARS) 0.1-0.2-0.3 % SOLN Place 1 drop into both eyes in the morning and at bedtime.   Yes [provider]  atorvastatin (LIPITOR) 10 MG tablet Take 10 mg by mouth every evening.    Yes [provider]  baclofen (LIORESAL) 10 MG tablet Take 10 mg by mouth 3 (three) times daily. 06/14/19  Yes [provider]  divalproex (DEPAKOTE SPRINKLE) 125 MG capsule Take 125 mg by mouth in the morning. 03/19/20  Yes [provider]  escitalopram (LEXAPRO) 20 MG tablet Take 1 tablet (20 mg total) by mouth daily. 01/06/20  Yes Cameron Sprang, MD  levETIRAcetam  (KEPPRA) 500 MG tablet Take 1 tablet (500 mg total) by mouth 2 (two) times daily. Patient taking differently: Take 500 mg by mouth in the morning and at bedtime.  01/30/20  Yes Cameron Sprang, MD  LORazepam (ATIVAN) 1 MG tablet Give 1 tablet as needed for abnormal movements lasting more than 15 minutes. Do not give more than 2-3 times a week Patient taking differently: Take 1 mg by mouth daily as needed for anxiety.  02/03/20  Yes Cameron Sprang, MD  Memantine HCl-Donepezil HCl [facility]) 28-10 MG CP24 Take 1 capsule by mouth daily. Patient taking differently: Take 1 capsule by mouth in the morning.  08/26/19  Yes Nita Sells, MD  mirabegron ER (MYRBETRIQ) 50 MG TB24 tablet Take 50 mg by mouth daily.    Yes [provider]  neomycin-bacitracin-polymyxin (NEOSPORIN) [phone] ointment Apply 1 application topically See admin instructions. Apply topically to left elbow once daily for open area- clean area first and apply a Band-Aid afterwards   Yes [provider]  pantoprazole (PROTONIX) 40 MG tablet Take 1 tablet (40 mg total) by mouth at bedtime. 08/26/19  Yes Nita Sells, MD  Polyethyl Glycol-Propyl Glycol (SYSTANE) 0.4-0.3 % GEL ophthalmic gel Place 1 application into both eyes in the morning and at bedtime.   Yes [provider]  Polyethyl Glycol-Propyl Glycol (SYSTANE) 0.4-0.3 % SOLN Place 1 application into both eyes every 4 (four) hours.   Yes [provider]  polyethylene glycol (MIRALAX / GLYCOLAX) 17 g packet Take 17 g by mouth daily as needed (for constipation- MIX AND DRINK).    Yes [provider]  Sennosides 15 MG TABS Take 15 mg by mouth daily as needed (for constipation).   Yes [provider]  SYNTHROID 150 MCG tablet Take 150 mcg by mouth daily before breakfast.   Yes [provider]  traZODone (DESYREL) 100 MG tablet Take 1 tablet (100 mg total) by mouth at bedtime. 08/26/19  Yes Nita Sells, MD   divalproex (DEPAKOTE ER) 250 MG 24 hr tablet Take 1 tablet (250 mg total) by mouth daily. Patient not taking: Reported on 04/12/2020 02/03/20   Cameron Sprang, MD    Allergies    Aspirin and Rapaflo [silodosin]  Review of Systems   Review of Systems  Unable to perform ROS: Dementia    Physical Exam Updated Vital Signs BP 138/63 (BP Location: Left Arm)   Pulse 70   Resp (!) 25   Wt 60.4 kg   SpO2 98%   BMI 18.06 kg/m   Physical Exam Vitals and nursing note reviewed.  Constitutional:      Appearance: He is well-developed.  HENT:     Head: Normocephalic and atraumatic.     Mouth/Throat:     Mouth: Mucous membranes are dry.  Eyes:     General:  Right eye: No discharge.        Left eye: No discharge.     Conjunctiva/sclera: Conjunctivae normal.  Neck:     Trachea: No tracheal deviation.  Cardiovascular:     Rate and Rhythm: Normal rate and regular rhythm.  Pulmonary:     Effort: Pulmonary effort is normal.     Breath sounds: Normal breath sounds.  Abdominal:     General: There is no distension.     Palpations: Abdomen is soft.     Tenderness: There is no abdominal tenderness. There is no guarding.  Musculoskeletal:     Cervical back: Normal range of motion and neck supple.  Skin:    General: Skin is warm.     Findings: No rash.  Neurological:     Mental Status: He is alert.     Comments: Patient is 5+ strength equal upper and lower extremities bilateral, gross sensation intact in all extremities.  Mild expressive aphasia, mild right facial droop.   Psychiatric:     Comments: Pleasant dementia     ED Results / Procedures / Treatments   Labs (all labs ordered are listed, but only abnormal results are displayed) Labs Reviewed  APTT - Abnormal; Notable for the following components:      Result Value   aPTT 38 (*)    All other components within normal limits  CBC - Abnormal; Notable for the following components:   RBC 3.81 (*)    Hemoglobin 11.3 (*)     HCT 37.5 (*)    All other components within normal limits  COMPREHENSIVE METABOLIC PANEL - Abnormal; Notable for the following components:   BUN 27 (*)    Albumin 3.4 (*)    All other components within normal limits  I-STAT CHEM 8, ED - Abnormal; Notable for the following components:   BUN 28 (*)    Calcium, Ion 0.84 (*)    TCO2 21 (*)    Hemoglobin 12.6 (*)    HCT 37.0 (*)    All other components within normal limits  RESPIRATORY PANEL BY RT PCR (FLU A&B, COVID)  PROTIME-INR  DIFFERENTIAL  URINALYSIS, ROUTINE W REFLEX MICROSCOPIC  VALPROIC ACID LEVEL  CBG MONITORING, ED    EKG None  Radiology CT HEAD CODE STROKE WO CONTRAST  Result Date: 04/12/2020 CLINICAL DATA:  Code stroke. Right-sided facial droop and dysphagia. EXAM: CT HEAD WITHOUT CONTRAST TECHNIQUE: Contiguous axial images were obtained from the base of the skull through the vertex without intravenous contrast. COMPARISON:  02/11/2020 CT. 08/22/2019 MRI FINDINGS: Brain: Age related volume loss. Chronic small-vessel ischemic changes of the hemispheric white matter, pons, thalami and basal ganglia. No sign of acute infarction, mass lesion, hemorrhage, hydrocephalus or extra-axial collection. Vascular: There is atherosclerotic calcification of the major vessels at the base of the brain. Skull: Negative Sinuses/Orbits: Clear/normal Other: None ASPECTS ([place] Stroke Program Early CT Score) - Ganglionic level infarction (caudate, lentiform nuclei, internal capsule, insula, M1-M3 cortex): 7 - Supraganglionic infarction (M4-M6 cortex): 3 Total score (0-10 with 10 being normal): 10 IMPRESSION: 1. No acute finding by CT. Atrophy and chronic small-vessel ischemic changes of the white matter. 2. ASPECTS is 10. 3. These results were communicated to Dr. Curly Shores at 2:36 pmon 11/1/2021by text page via the [facility] messaging system. * Electronically Signed   By: Nelson Chimes M.D.   On: 04/12/2020 14:39    Procedures .Critical Care Performed  by: Elnora Morrison, MD Authorized by: Elnora Morrison, MD   Critical care  provider statement:    Critical care time (minutes):  30   Critical care start time:  04/12/2020 3:00 PM   Critical care end time:  04/12/2020 3:30 PM   Critical care time was exclusive of:  Separately billable procedures and treating other patients and teaching time   Critical care was necessary to treat or prevent imminent or life-threatening deterioration of the following conditions:  CNS failure or compromise   Critical care was time spent personally by me on the following activities:  Discussions with consultants, evaluation of patient's response to treatment, examination of patient, ordering and performing treatments and interventions, ordering and review of laboratory studies, ordering and review of radiographic studies, pulse oximetry, re-evaluation of patient's condition, obtaining history from patient or surrogate and review of old charts   (including critical care time)  Medications Ordered in ED Medications  sodium chloride flush (NS) 0.9 % injection 3 mL (has no administration in time range)    ED Course  I have reviewed the triage vital signs and the nursing notes.  Pertinent labs & imaging results that were available during my care of the patient were reviewed by me and considered in my medical decision making (see chart for details).    MDM Rules/Calculators/A&P                         Patient presented as code stroke with recent neurologic changes.  Code stroke was activated prehospital and team was ready on arrival.  Patient with dementia history comes from nursing home with new/worsening neurologic findings specifically right facial droop and change in speech.  Evaluate the patient with neurology and EMS in the ambulance bay.  Patient taken immediately to CT scan, results reviewed no acute bleeding or abnormalities.  Discussed with family on arrival patient is not at baseline currently.  Plan for  urinalysis.  Blood work reviewed showing mild elevated BUN in the 20s, normal creatinine.  Mild anemia 11.  Plan for further observation in the hospital, MRI per neurology.  Patient care be signed out to continue to follow-up results until admission or further neurology recommendations. Normal glucose. Differential diagnosis includes stroke, metabolic, infectious/urine, seizures, worsening dementia, other.   Final Clinical Impression(s) / ED Diagnoses Final diagnoses:  Facial droop  Vascular dementia without behavioral disturbance [facility])    Rx / DC Orders ED Discharge Orders    None       Elnora Morrison, MD 04/12/20 1543

## 2020-04-12 NOTE — Discharge Instructions (Addendum)
Patient did not appear to have stroke with a normal MRI here.  His labs are also essentially normal.  His vital signs have been stable here.  He has been up and walking.  However, he did receive Ativan for sedation for his MRI.  The effects of the Ativan may last up to 24 hours.  Please watch him carefully did not allow him to walk on his own until he is back to his baseline

## 2020-04-16 ENCOUNTER — Ambulatory Visit: Payer: Medicare Other | Admitting: Neurology

## 2020-04-16 ENCOUNTER — Other Ambulatory Visit: Payer: Self-pay

## 2020-04-16 ENCOUNTER — Encounter: Payer: Self-pay | Admitting: Neurology

## 2020-04-16 VITALS — HR 102

## 2020-04-16 DIAGNOSIS — F419 Anxiety disorder, unspecified: Secondary | ICD-10-CM | POA: Diagnosis not present

## 2020-04-16 DIAGNOSIS — F0151 Vascular dementia with behavioral disturbance: Secondary | ICD-10-CM | POA: Diagnosis not present

## 2020-04-16 DIAGNOSIS — R4182 Altered mental status, unspecified: Secondary | ICD-10-CM

## 2020-04-16 DIAGNOSIS — F445 Conversion disorder with seizures or convulsions: Secondary | ICD-10-CM

## 2020-04-16 DIAGNOSIS — F01518 Vascular dementia, unspecified severity, with other behavioral disturbance: Secondary | ICD-10-CM

## 2020-04-16 MED ORDER — ESCITALOPRAM OXALATE 20 MG PO TABS
ORAL_TABLET | ORAL | 3 refills | Status: DC
Start: 2020-04-16 — End: 2020-06-15

## 2020-04-16 MED ORDER — DIVALPROEX SODIUM 250 MG PO DR TAB: DELAYED_RELEASE_TABLET | ORAL | 11 refills | Status: DC

## 2020-04-16 NOTE — Patient Instructions (Signed)
1. Increase Depakote to 250mg  BID for 1 week, then 500mg  BID. If no change in events, call our office and we will plan for inpatient EEG study  2. Increase Lexapro to 30mg  daily  3. Continue Keppra 500mg  twice a day  4. Follow-up as scheduled

## 2020-04-16 NOTE — Progress Notes (Signed)
NEUROLOGY FOLLOW UP OFFICE NOTE  Willie [name] [id number] 1940-08-04  HISTORY OF PRESENT ILLNESS: I had the pleasure of seeing Willie [name] in follow-up in the neurology clinic on 04/16/2020.  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 He is again accompanied by his daughter-in-law [name] who helps supplement the history today.  Records and images were personally reviewed where available.  He is noted to have repetitive arm movements that are distractible in the office, he would have shaking of arms that start and stop suddenly, then when asked to raise arms or clap hands, the movements stop and he can follow commands. [name] played 35's music in the car and he enjoyed it and did not have any abnormal movements. He was in the ER twice for falls on 8/31 and 9/1. He was moved from ALF to [facility]. He had another fall on 10/29, then [name] noticed that 2 days later he had a big bruise on the left temporal region. She is not sure if it was from the fall 2 days prior or another one. While he was visiting her, he had a different type of event where he suddenly stopped responding, jaw was slack, he was limp, "no one home" but breathing fine. The episode lasted 2 minutes, then his speech was garbled after. A few minutes later, he was talking fine like nothing happened. He was brought to the ER on 11/1 where staff noticed right facial droop, garbled speech, not acting right. [name] shows a video where he is sitting and needing to be fed, chewing slowly and not very responsive. Then the next meal he cleaned off his plate independently. I personally reviewed MRI brain with and without contrast which did not show any acute changes. 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 He was with [name] last night when he had another type of  episode where his arms and legs became rigid, extended out, he was stiff as a board with his buttocks lifted off the wheelchair for 20-30 seconds. His body then went slack and he started sliding off the wheelchair. He had bowel incontinence after. He had a similar milder spell with nursing staff earlier and [name] notes he again had bowel incontinence. [name] notes that he has had progressive decline, he is mostly in the wheelchair due to frequent falls, however still tries to get off the bed despite being in a hospital bed, leading to falls. He got agitated at [facility] last night, swinging the metal part off his wheelchair around. [name] has not seen this agitation, staff states he is totally different when she is present. Sometimes he spits out his medications mixed in applesauce, she is not sure how much he gets. She feels he is depressed, he says several times during the visit that he does not feel well and that "I'm going to die." Conversations are declining, he says 1-2 words at a time. He can still read. He reports dizziness, [name] states he has double vision without his glasses. He did not sleep well last night. No hallucinations. He was started on Depakote 250mg  daily on last visit, however today comes in listed as taking Depakote sprinkles 125mg  daily, in addition to Levetiracetam 500mg  BID. He is on Lexapro 20mg  daily and Trazodone 100mg  qhs.    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 Records from the hospital were reviewed. He was in the ER on 07/22/19 for shaking and worsening cognition in the setting of dementia, diagnosed and treated with IV fluids followed by improvement. On 3/12, he was found unresponsive at [facility]. At baseline he is alert and oriented x  4, able to use his wheelchair during transfers. On EMS arrival, he was noted to have a RR of 6, also report of CBG in the 60s. He had a spell described as seizure by EMS, hand gripping during episodes, and was given Versed. He remained unresponsive and then began moving his left UE and gimacing, not focusing, then was intubated. He was quickly extubated because he became alert and squeezing hands to command. Family reported that shaking/shivering episodes started in December 2020. He began radiation treatment for prostate cancer in November. Prior to all this, he had always been extremely independent. He has been using his walker since the stroke in 2015, however had been wheelchair-bound since December, reporting his legs felt spongy. Family felt worsening was in parallel with progression of 30-day course of radiation treatments. [name] shows a video of the shivering episodes, she reports he was having right hand and leg jerking and saying it had something to do with the airconditioner. He then started having side to side upper body rocking movements. He had bowel incontinence and did not realize he had done it. He was started on low dose Keppra as an outpatient. In the ER, he was witnessed to have 4 shaking episodes in the course of 2 minutes, able to respond, starting with low amplitude shaking that gradually increased over about 15 seconds. He had an MRI brain which I personally reviewed, no acute changes, there was mild to moderate chronic microvascular disease, diffuse volume loss with ventricular prominence, not progressed since 2011. He had a routine EEG showing mild to moderate diffuse encephalopathy, then an overnight EEG done captured multiple episodes of generalized non-rhythmic whole body twitching without EEG correlate. Bloodwork showed AKI 38/1.44 (baseline 1.08), lactic acid initially 1.3, then 2.1, glucose 140, ammonia normal. He states he does not remember shaking, does not remember much except  being wheeled out. Staff notes indicate that he has not had any seizures since hospital discharge, but on 3/24 had uncontrollable tremors at times. He refused to use his eye drops when [name] says he knows he should be using them. He had a fall that day and was found on his knees. There is another report of fall on 3/21 where he was also found on his knees.   He had recently moved from independent living to assisted living in August 2020, which he was unhappy about.  He was driving around town applying to different apartment complexes and leaving multiple deposits. He was missing his medications. There was also question of possible hallucinations. He would say his clothes were all wet from sweat, [name] would take his clean clothes because he says they were all wet when they were not. He says staff won't help him when he asks, that they have not changed the sheets since he has been there. [name] reminds him they have changed the sheets, but they may not have changed the blankets. Another time he asked for a laxative and said he had a bowel movement but there was nothing in the toilet, he says he feels like there is  something coming. While in rehab, staff had mentioned he walked out of the room naked one time. He reports his mood is sad.  He denies any headaches, dizziness, dysarthria/dysphagia, neck/back pain, anosmia, or tremors. With his stroke in 2015, he had diplopia and eye closure weakness, he states they had to sew his eyelids together. He is not sleeping well at night and is drowsy during the daytime.      PAST MEDICAL HISTORY: Past Medical History:  Diagnosis Date  . Alcohol abuse   . Benign prostatic hypertrophy   . Dementia [facility])    daughter-in-law does not know of this   . Diabetes mellitus   . Encephalopathy   . GERD (gastroesophageal reflux disease)   . History of tarsorrhaphy    right eye  . Hypertension   . Hypothyroidism   . Hypothyroidism   . Pneumonia   . Prostate cancer  ([place])   . Seizures ([place])   . Stroke [facility])    right eye does not shut all the way-tarsorrhaphy    MEDICATIONS: Current Outpatient Medications on File Prior to Visit  Medication Sig Dispense Refill  . acetaminophen (TYLENOL) 325 MG tablet Take 325-650 mg by mouth See admin instructions. Take 325 mg by mouth in the morning and an additional 650 mg every six hours as needed for pain    . Artificial Tear Solution (GENTEAL TEARS) 0.1-0.2-0.3 % SOLN Place 1 drop into both eyes in the morning and at bedtime.    Marland Kitchen atorvastatin (LIPITOR) 10 MG tablet Take 10 mg by mouth every evening.     . baclofen (LIORESAL) 10 MG tablet Take 10 mg by mouth 3 (three) times daily.    . divalproex (DEPAKOTE ER) 250 MG 24 hr tablet Take 1 tablet (250 mg total) by mouth daily. 30 tablet 11  . divalproex (DEPAKOTE SPRINKLE) 125 MG capsule Take 125 mg by mouth in the morning.    . escitalopram (LEXAPRO) 20 MG tablet Take 1 tablet (20 mg total) by mouth daily. 90 tablet 3  . levETIRAcetam (KEPPRA) 500 MG tablet Take 1 tablet (500 mg total) by mouth 2 (two) times daily. (Patient taking differently: Take 500 mg by mouth in the morning and at bedtime. ) 60 tablet 0  . LORazepam (ATIVAN) 1 MG tablet Give 1 tablet as needed for abnormal movements lasting more than 15 minutes. Do not give more than 2-3 times a week (Patient taking differently: Take 1 mg by mouth daily as needed for anxiety. ) 10 tablet 5  . Memantine HCl-Donepezil HCl (NAMZARIC) 28-10 MG CP24 Take 1 capsule by mouth daily. (Patient taking differently: Take 1 capsule by mouth in the morning. ) 3 capsule 0  . mirabegron ER (MYRBETRIQ) 50 MG TB24 tablet Take 50 mg by mouth daily.     Marland Kitchen neomycin-bacitracin-polymyxin (NEOSPORIN) [phone] ointment Apply 1 application topically See admin instructions. Apply topically to left elbow once daily for open area- clean area first and apply a Band-Aid afterwards    . pantoprazole (PROTONIX) 40 MG tablet Take 1 tablet (40 mg  total) by mouth at bedtime. 30 tablet   . Polyethyl Glycol-Propyl Glycol (SYSTANE) 0.4-0.3 % GEL ophthalmic gel Place 1 application into both eyes in the morning and at bedtime.    Vladimir Faster Glycol-Propyl Glycol (SYSTANE) 0.4-0.3 % SOLN Place 1 application into both eyes every 4 (four) hours.    . polyethylene glycol (MIRALAX / GLYCOLAX) 17 g packet Take 17 g by mouth daily as needed (for constipation-  MIX AND DRINK).     . Sennosides 15 MG TABS Take 15 mg by mouth daily as needed (for constipation).    . SYNTHROID 150 MCG tablet Take 150 mcg by mouth daily before breakfast.    . traZODone (DESYREL) 100 MG tablet Take 1 tablet (100 mg total) by mouth at bedtime. 30 tablet 0   No current facility-administered medications on file prior to visit.    ALLERGIES: Allergies  Allergen Reactions  . Aspirin Other (See Comments)    Had stroke in March, 2015- NO more aspirin!!   . Rapaflo [Silodosin] Other (See Comments)    "Allergic," per MAR    FAMILY HISTORY: Family History  Problem Relation Age of Onset  . Prostate cancer Brother   . Colon cancer Brother   . Breast cancer Neg Hx   . Pancreatic cancer Neg Hx     SOCIAL HISTORY: Social History   Socioeconomic History  . Marital status: Divorced    Spouse name: Not on file  . Number of children: 2  . Years of education: Not on file  . Highest education level: Not on file  Occupational History  . Not on file  Tobacco Use  . Smoking status: Never Smoker  . Smokeless tobacco: Never Used  Vaping Use  . Vaping Use: Never used  Substance and Sexual Activity  . Alcohol use: Not Currently  . Drug use: No  . Sexual activity: Not Currently  Other Topics Concern  . Not on file  Social History Narrative   Patient resides at [facility] in [place].   Right handed   Caffeine No   Social Determinants of Health   Financial Resource Strain:   . Difficulty of Paying Living Expenses: Not on file  Food Insecurity:   . Worried About  Charity fundraiser in the Last Year: Not on file  . Ran Out of Food in the Last Year: Not on file  Transportation Needs:   . Lack of Transportation (Medical): Not on file  . Lack of Transportation (Non-Medical): Not on file  Physical Activity:   . Days of Exercise per Week: Not on file  . Minutes of Exercise per Session: Not on file  Stress:   . Feeling of Stress : Not on file  Social Connections:   . Frequency of Communication with Friends and Family: Not on file  . Frequency of Social Gatherings with Friends and Family: Not on file  . Attends Religious Services: Not on file  . Active Member of Clubs or Organizations: Not on file  . Attends Archivist Meetings: Not on file  . Marital Status: Not on file  Intimate Partner Violence:   . Fear of Current or Ex-Partner: Not on file  . Emotionally Abused: Not on file  . Physically Abused: Not on file  . Sexually Abused: Not on file     PHYSICAL EXAM: Vitals:   04/16/20 0835  Pulse: (!) 102  SpO2: 98%   General: No acute distress. Throughout the visit he has intermittent irregular upper extremity tremors that start and stop suddenly, easily distractible and disappearing when he is following commands to raise arms or do finger to nose testing with no tremors seen. Head:  Normocephalic/atraumatic Skin/Extremities: No rash, no edema Neurological Exam: alert and oriented to person, city/state. Says it is August 91. No aphasia or dysarthria. Fund of knowledge is reduced.  Recent and remote memory are impaired, 2/3 delayed recall. Attention and concentration are normal.  Cranial nerves: Pupils equal, round. Extraocular movements intact with no nystagmus. Visual fields full.  No facial asymmetry.  Motor: Bulk and tone normal, muscle strength 5/5 throughout with no pronator drift.   Finger to nose testing intact.  Gait not tested   IMPRESSION: This is an 79 yo RH man with a history of hypertension, hyperlipidemia, diabetes, stroke  in 2015 with residual mild right-sided weakness, who was hospitalized in 08/2019 for unresponsiveness and shaking episodes. The initial episode of unresponsiveness appears to have been a hypoglycemic event, however the shaking spells captured on EEG did not show any epileptiform correlate. He continues to have irregular abnormal movements of both arms seen in the office today, easily distractible. However, over the past week, he has had new episodes of body stiffening and unresponsiveness. 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 He would benefit from inpatient vEEG to further classify events. We discussed increasing Depakote to 250mg  BID for 1 week, then 500mg  BID. If no change, will plan for EMU admission. Continue Levetiracetam 500mg  BID for now as well. He is also expressing depressive thoughts, increase Lexapro to 30mg  daily. Continue close supervision, he needs 24/7 skilled care at this point. Follow-up in 2 months, they know to call for any changes.   Thank you for allowing me to participate in his care.  Please do not hesitate to call for any questions or concerns.   Ellouise Newer, M.D.   CC: Glory Buff, NP

## 2020-05-05 ENCOUNTER — Ambulatory Visit: Payer: Medicare Other | Admitting: Neurology

## 2020-06-10 ENCOUNTER — Encounter (HOSPITAL_COMMUNITY): Payer: Self-pay

## 2020-06-10 ENCOUNTER — Other Ambulatory Visit: Payer: Self-pay

## 2020-06-10 ENCOUNTER — Inpatient Hospital Stay (HOSPITAL_COMMUNITY)
Admission: EM | Admit: 2020-06-10 | Discharge: 2020-06-15 | DRG: 177 | Disposition: A | Discharge status: Hospice | Payer: Medicare Other | Source: Skilled Nursing Facility | Attending: Internal Medicine | Admitting: Internal Medicine

## 2020-06-10 ENCOUNTER — Emergency Department (HOSPITAL_COMMUNITY): Payer: Medicare Other

## 2020-06-10 DIAGNOSIS — I10 Essential (primary) hypertension: Secondary | ICD-10-CM | POA: Diagnosis present

## 2020-06-10 DIAGNOSIS — Z8042 Family history of malignant neoplasm of prostate: Secondary | ICD-10-CM

## 2020-06-10 DIAGNOSIS — I4510 Unspecified right bundle-branch block: Secondary | ICD-10-CM | POA: Diagnosis present

## 2020-06-10 DIAGNOSIS — H109 Unspecified conjunctivitis: Secondary | ICD-10-CM | POA: Diagnosis present

## 2020-06-10 DIAGNOSIS — I69311 Memory deficit following cerebral infarction: Secondary | ICD-10-CM

## 2020-06-10 DIAGNOSIS — Z66 Do not resuscitate: Secondary | ICD-10-CM | POA: Diagnosis present

## 2020-06-10 DIAGNOSIS — R54 Age-related physical debility: Secondary | ICD-10-CM | POA: Diagnosis present

## 2020-06-10 DIAGNOSIS — E11649 Type 2 diabetes mellitus with hypoglycemia without coma: Secondary | ICD-10-CM | POA: Diagnosis present

## 2020-06-10 DIAGNOSIS — E861 Hypovolemia: Secondary | ICD-10-CM | POA: Diagnosis present

## 2020-06-10 DIAGNOSIS — Z681 Body mass index (BMI) 19 or less, adult: Secondary | ICD-10-CM

## 2020-06-10 DIAGNOSIS — T17800A Unspecified foreign body in other parts of respiratory tract causing asphyxiation, initial encounter: Secondary | ICD-10-CM | POA: Diagnosis present

## 2020-06-10 DIAGNOSIS — R1310 Dysphagia, unspecified: Secondary | ICD-10-CM | POA: Diagnosis present

## 2020-06-10 DIAGNOSIS — J9601 Acute respiratory failure with hypoxia: Secondary | ICD-10-CM | POA: Diagnosis present

## 2020-06-10 DIAGNOSIS — E43 Unspecified severe protein-calorie malnutrition: Secondary | ICD-10-CM | POA: Diagnosis present

## 2020-06-10 DIAGNOSIS — H02233 Paralytic lagophthalmos right eye, unspecified eyelid: Secondary | ICD-10-CM | POA: Diagnosis present

## 2020-06-10 DIAGNOSIS — I69391 Dysphagia following cerebral infarction: Secondary | ICD-10-CM

## 2020-06-10 DIAGNOSIS — E870 Hyperosmolality and hypernatremia: Secondary | ICD-10-CM | POA: Diagnosis present

## 2020-06-10 DIAGNOSIS — J690 Pneumonitis due to inhalation of food and vomit: Principal | ICD-10-CM

## 2020-06-10 DIAGNOSIS — E876 Hypokalemia: Secondary | ICD-10-CM | POA: Diagnosis present

## 2020-06-10 DIAGNOSIS — E785 Hyperlipidemia, unspecified: Secondary | ICD-10-CM | POA: Diagnosis present

## 2020-06-10 DIAGNOSIS — Z886 Allergy status to analgesic agent status: Secondary | ICD-10-CM

## 2020-06-10 DIAGNOSIS — F1011 Alcohol abuse, in remission: Secondary | ICD-10-CM | POA: Diagnosis present

## 2020-06-10 DIAGNOSIS — Z79890 Hormone replacement therapy (postmenopausal): Secondary | ICD-10-CM

## 2020-06-10 DIAGNOSIS — Z7401 Bed confinement status: Secondary | ICD-10-CM

## 2020-06-10 DIAGNOSIS — F0150 Vascular dementia without behavioral disturbance: Secondary | ICD-10-CM | POA: Diagnosis present

## 2020-06-10 DIAGNOSIS — Z79899 Other long term (current) drug therapy: Secondary | ICD-10-CM

## 2020-06-10 DIAGNOSIS — R64 Cachexia: Secondary | ICD-10-CM | POA: Diagnosis present

## 2020-06-10 DIAGNOSIS — N400 Enlarged prostate without lower urinary tract symptoms: Secondary | ICD-10-CM | POA: Diagnosis present

## 2020-06-10 DIAGNOSIS — E039 Hypothyroidism, unspecified: Secondary | ICD-10-CM | POA: Diagnosis present

## 2020-06-10 DIAGNOSIS — Z8546 Personal history of malignant neoplasm of prostate: Secondary | ICD-10-CM

## 2020-06-10 DIAGNOSIS — R0989 Other specified symptoms and signs involving the circulatory and respiratory systems: Secondary | ICD-10-CM

## 2020-06-10 DIAGNOSIS — Z20822 Contact with and (suspected) exposure to covid-19: Secondary | ICD-10-CM | POA: Diagnosis present

## 2020-06-10 DIAGNOSIS — Z888 Allergy status to other drugs, medicaments and biological substances status: Secondary | ICD-10-CM

## 2020-06-10 DIAGNOSIS — J189 Pneumonia, unspecified organism: Secondary | ICD-10-CM | POA: Diagnosis present

## 2020-06-10 DIAGNOSIS — Z800 Family history of malignant neoplasm of digestive organs: Secondary | ICD-10-CM

## 2020-06-10 DIAGNOSIS — L8990 Pressure ulcer of unspecified site, unspecified stage: Secondary | ICD-10-CM | POA: Insufficient documentation

## 2020-06-10 DIAGNOSIS — G40909 Epilepsy, unspecified, not intractable, without status epilepticus: Secondary | ICD-10-CM | POA: Diagnosis present

## 2020-06-10 DIAGNOSIS — R531 Weakness: Secondary | ICD-10-CM

## 2020-06-10 DIAGNOSIS — K219 Gastro-esophageal reflux disease without esophagitis: Secondary | ICD-10-CM | POA: Diagnosis present

## 2020-06-10 DIAGNOSIS — F0390 Unspecified dementia without behavioral disturbance: Secondary | ICD-10-CM

## 2020-06-10 DIAGNOSIS — E871 Hypo-osmolality and hyponatremia: Secondary | ICD-10-CM | POA: Diagnosis present

## 2020-06-10 LAB — CBC WITH DIFFERENTIAL/PLATELET
Abs Immature Granulocytes: 0.03 10*3/uL (ref 0.00–0.07)
Basophils Absolute: 0 10*3/uL (ref 0.0–0.1)
Basophils Relative: 0 %
Eosinophils Absolute: 0 10*3/uL (ref 0.0–0.5)
Eosinophils Relative: 0 %
HCT: 33.2 % — ABNORMAL LOW (ref 39.0–52.0)
Hemoglobin: 10.4 g/dL — ABNORMAL LOW (ref 13.0–17.0)
Immature Granulocytes: 0 %
Lymphocytes Relative: 16 %
Lymphs Abs: 1.7 10*3/uL (ref 0.7–4.0)
MCH: 30.5 pg (ref 26.0–34.0)
MCHC: 31.3 g/dL (ref 30.0–36.0)
MCV: 97.4 fL (ref 80.0–100.0)
Monocytes Absolute: 0.4 10*3/uL (ref 0.1–1.0)
Monocytes Relative: 4 %
Neutro Abs: 8.5 10*3/uL — ABNORMAL HIGH (ref 1.7–7.7)
Neutrophils Relative %: 80 %
Platelets: 307 10*3/uL (ref 150–400)
RBC: 3.41 MIL/uL — ABNORMAL LOW (ref 4.22–5.81)
RDW: 15.1 % (ref 11.5–15.5)
WBC: 10.7 10*3/uL — ABNORMAL HIGH (ref 4.0–10.5)
nRBC: 0 % (ref 0.0–0.2)

## 2020-06-10 MED ORDER — PIPERACILLIN-TAZOBACTAM 3.375 G IVPB 30 MIN
3.3750 g | Freq: Once | INTRAVENOUS | Status: AC
  Administered 2020-06-11: 3.375 g via INTRAVENOUS
  Filled 2020-06-10: qty 50

## 2020-06-10 NOTE — ED Provider Notes (Signed)
[facility] EMERGENCY DEPARTMENT Provider Note   CSN: [id number] Arrival date & time: 06/10/20  2224     History Chief Complaint  Patient presents with  . Weakness    Willie [name] is a 79 y.o. male.  The history is provided by the patient and medical records.  Weakness   LEVEL V CAVEAT:  DEMENTIA  79 y.o. M with hx of alcohol abuse, dementia, DM, GERD, HTN, hypothyroidism, seizures, prior stroke, presenting to the ED with poor oral intake and decreased activity.  Staff at his SNF [facility]) in [place] report that he choked on his dinner 06/09/20 and feels like he may have aspirated.  Today, he has been less active and has not been eating/drinking.  On arrival to ED, O2 sats 88% on RA, placed on 2L with improvement.  He is not generally O2 dependent.  No reported fevers.  Patient is arouseable on exam, answers questions by shaking head "yes" and "no".  Denies pain.  Past Medical History:  Diagnosis Date  . Alcohol abuse   . Benign prostatic hypertrophy   . Dementia [facility])    daughter-in-law does not know of this   . Diabetes mellitus   . Encephalopathy   . GERD (gastroesophageal reflux disease)   . History of tarsorrhaphy    right eye  . Hypertension   . Hypothyroidism   . Hypothyroidism   . Pneumonia   . Prostate cancer ([place])   . Seizures ([place])   . Stroke [facility])    right eye does not shut all the way-tarsorrhaphy    Patient Active Problem List   Diagnosis Date Noted  . Lung nodule < 6cm on CT 02/10/2020  . Malignant neoplasm of prostate ([place]) 01/21/2019  . Generalized weakness 01/01/2019  . Acute metabolic encephalopathy 99991111  . Controlled type 2 diabetes mellitus with hypoglycemia ([place]) 12/31/2018  . Hypothyroidism 12/31/2018  . Normocytic normochromic anemia 12/31/2018  . Hyponatremia 12/31/2018  . Nuclear sclerotic cataract of both eyes 09/03/2014  . 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 UGIB (upper gastrointestinal  bleed) 08/16/2013  . HTN (hypertension) 08/15/2013  . Diabetes type 2, uncontrolled ([place]) 08/15/2013  . HLD (hyperlipidemia) 08/15/2013  . ICH (intracerebral hemorrhage) ([place]) 08/15/2013    Past Surgical History:  Procedure Laterality Date  . COLONOSCOPY WITH PROPOFOL N/A 08/24/2014   Procedure: COLONOSCOPY WITH PROPOFOL;  Surgeon: Garlan Fair, MD;  Location: [facility] ENDOSCOPY;  Service: Endoscopy;  Laterality: N/A;  . EYE SURGERY     Laser eye surgery  . GOLD SEED IMPLANT N/A 04/18/2019   Procedure: GOLD SEED IMPLANT;  Surgeon: Kathie Rhodes, MD;  Location: [facility] ORS;  Service: Urology;  Laterality: N/A;  . PROSTATE BIOPSY    . SPACE OAR INSTILLATION N/A 04/18/2019   Procedure: SPACE OAR INSTILLATION;  Surgeon: Kathie Rhodes, MD;  Location: [facility] ORS;  Service: Urology;  Laterality: N/A;       Family History  Problem Relation Age of Onset  . Prostate cancer Brother   . Colon cancer Brother   . Breast cancer Neg Hx   . Pancreatic cancer Neg Hx     Social History   Tobacco Use  . Smoking status: Never Smoker  . Smokeless tobacco: Never Used  Vaping Use  . Vaping Use: Never used  Substance Use Topics  . Alcohol use: Not Currently  . Drug use: No    Home Medications Prior to Admission medications   Medication Sig Start Date End Date Taking?  Authorizing Provider  acetaminophen (TYLENOL) 325 MG tablet Take 325-650 mg by mouth See admin instructions. Take 325 mg by mouth in the morning and an additional 650 mg every six hours as needed for pain    [provider]  Artificial Tear Solution (GENTEAL TEARS) 0.1-0.2-0.3 % SOLN Place 1 drop into both eyes in the morning and at bedtime.    [provider]  atorvastatin (LIPITOR) 10 MG tablet Take 10 mg by mouth every evening.     [provider]  baclofen (LIORESAL) 10 MG tablet Take 10 mg by mouth 3 (three) times daily. 06/14/19   [provider]  divalproex (DEPAKOTE) 250 MG DR tablet Take 1 tablet  twice a day for 1 week, then increase to 2 tablets twice a day 04/16/20   Van Clines, MD  escitalopram (LEXAPRO) 20 MG tablet Take 1 and 1/2 tablets daily 04/16/20   Van Clines, MD  levETIRAcetam (KEPPRA) 500 MG tablet Take 1 tablet (500 mg total) by mouth 2 (two) times daily. Patient taking differently: Take 500 mg by mouth in the morning and at bedtime.  01/30/20   Van Clines, MD  LORazepam (ATIVAN) 1 MG tablet Give 1 tablet as needed for abnormal movements lasting more than 15 minutes. Do not give more than 2-3 times a week Patient taking differently: Take 1 mg by mouth daily as needed for anxiety.  02/03/20   Van Clines, MD  Memantine HCl-Donepezil HCl [facility]) 28-10 MG CP24 Take 1 capsule by mouth daily. Patient taking differently: Take 1 capsule by mouth in the morning.  08/26/19   Rhetta Mura, MD  mirabegron ER (MYRBETRIQ) 50 MG TB24 tablet Take 50 mg by mouth daily.     [provider]  neomycin-bacitracin-polymyxin (NEOSPORIN) [phone] ointment Apply 1 application topically See admin instructions. Apply topically to left elbow once daily for open area- clean area first and apply a Band-Aid afterwards    [provider]  pantoprazole (PROTONIX) 40 MG tablet Take 1 tablet (40 mg total) by mouth at bedtime. 08/26/19   Rhetta Mura, MD  Polyethyl Glycol-Propyl Glycol (SYSTANE) 0.4-0.3 % GEL ophthalmic gel Place 1 application into both eyes in the morning and at bedtime.    [provider]  Polyethyl Glycol-Propyl Glycol (SYSTANE) 0.4-0.3 % SOLN Place 1 application into both eyes every 4 (four) hours.    [provider]  polyethylene glycol (MIRALAX / GLYCOLAX) 17 g packet Take 17 g by mouth daily as needed (for constipation- MIX AND DRINK).     [provider]  Sennosides 15 MG TABS Take 15 mg by mouth daily as needed (for constipation).    [provider]  SYNTHROID 150 MCG tablet Take 150 mcg by mouth daily  before breakfast.    [provider]  traZODone (DESYREL) 100 MG tablet Take 1 tablet (100 mg total) by mouth at bedtime. 08/26/19   Rhetta Mura, MD    Allergies    Aspirin and Rapaflo [silodosin]  Review of Systems   Review of Systems  Unable to perform ROS: Dementia    Physical Exam Updated Vital Signs BP 138/73 (BP Location: Right Arm)   Pulse 60   Temp 97.8 F (36.6 C) (Oral)   Resp 12   SpO2 95%   Physical Exam Vitals and nursing note reviewed.  Constitutional:      Appearance: He is well-developed and well-nourished.     Comments: Elderly, thin Sleeping but easily awoken to voice  HENT:     Head: Normocephalic and atraumatic.     Mouth/Throat:     Mouth: Oropharynx is clear and moist.  Eyes:     Extraocular Movements: EOM normal.     Conjunctiva/sclera: Conjunctivae normal.     Pupils: Pupils are equal, round, and reactive to light.  Cardiovascular:     Rate and Rhythm: Normal rate and regular rhythm.     Heart sounds: Normal heart sounds.  Pulmonary:     Effort: Pulmonary effort is normal.     Breath sounds: Normal breath sounds.  Abdominal:     General: Bowel sounds are normal.     Palpations: Abdomen is soft.  Musculoskeletal:        General: Normal range of motion.     Cervical back: Normal range of motion.  Skin:    General: Skin is warm and dry.  Neurological:     Comments: Sleeping but easily arouses to voice, shakes head "yes" and "no" to questioning  Psychiatric:        Mood and Affect: Mood and affect normal.     ED Results / Procedures / Treatments   Labs (all labs ordered are listed, but only abnormal results are displayed) Labs Reviewed  CBC WITH DIFFERENTIAL/PLATELET - Abnormal; Notable for the following components:      Result Value   WBC 10.7 (*)    RBC 3.41 (*)    Hemoglobin 10.4 (*)    HCT 33.2 (*)    Neutro Abs 8.5 (*)    All other components within normal limits  COMPREHENSIVE METABOLIC PANEL - Abnormal;  Notable for the following components:   Sodium 148 (*)    Potassium 3.2 (*)    Glucose, Bld 136 (*)    BUN 40 (*)    Calcium 8.7 (*)    Total Protein 6.3 (*)    Albumin 2.1 (*)    AST 10 (*)    All other components within normal limits  VALPROIC ACID LEVEL - Abnormal; Notable for the following components:   Valproic Acid Lvl 25 (*)    All other components within normal limits  RESP PANEL BY RT-PCR (FLU A&B, COVID) ARPGX2  URINE CULTURE  URINALYSIS, ROUTINE W REFLEX MICROSCOPIC    EKG EKG Interpretation  Date/Time:  Thursday June 10 2020 22:27:20 EST Ventricular Rate:  59 PR Interval:    QRS Duration: 135 QT Interval:  495 QTC Calculation: 491 R Axis:   81 Text Interpretation: Sinus rhythm Short PR interval Right bundle branch block No significant change since last tracing Confirmed by Virgina Norfolk [phone]) on 06/10/2020 10:30:15 PM   Radiology DG Chest Port 1 View  Result Date: 06/10/2020 CLINICAL DATA:  Choked on dinner.  Possible aspiration. EXAM: PORTABLE CHEST 1 VIEW COMPARISON:  Radiograph 08/22/2019 FINDINGS: Moderate patchy opacities throughout the right lower lung zone. The left lung is clear. The heart is normal in size. Normal mediastinal contours with aortic atherosclerosis. No pneumothorax or pleural effusion. Right lower lateral rib fractures with incomplete callus formation and are either subacute or chronic. No pulmonary edema. Bones are diffusely under mineralized. IMPRESSION: 1. Moderate patchy opacities throughout the right lower lung zone suspicious for pneumonia, including aspiration. 2. Right lower lateral rib fractures with incomplete callus formation, likely subacute. Electronically Signed   By: Narda Rutherford M.D.   On: 06/10/2020 23:20    Procedures Procedures (including critical care time)  Medications Ordered in ED Medications  piperacillin-tazobactam (ZOSYN) IVPB 3.375 g (has no  administration in time range)    ED Course  I have  reviewed the triage vital signs and the nursing notes.  Pertinent labs & imaging results that were available during my care of the patient were reviewed by me and considered in my medical decision making (see chart for details).    MDM Rules/Calculators/A&P    79 year old male presenting to the ED from SNF for decreased activity and poor oral intake.  On arrival to ED he was noted to have low oxygen saturations to 88% on room air, improved with 2 L.  He is generally not oxygen dependent.  There is notes from facility stating he choked on his dinner last evening, possible aspiration.  He is afebrile, nontoxic, severely demented but does open eyes and shake head yes or no to simple questions.  He is thin, cachectic.  Vitals remained stable on 2 L.  Chest x-ray today with large right lower lobe infiltrate concerning for aspiration pneumonia.  His Covid screen is negative along with influenza.  Labs are grossly reassuring, does seem clinically dry with a BUN of 40.  Would benefit from gentle hydration.  IV zosyn started for aspiration pneumonia.  Given his new O2 requirement, will require admission for ongoing care.  Discussed with hospitalist, Dr. Kyung Bacca-- will admit for ongoing care.  Final Clinical Impression(s) / ED Diagnoses Final diagnoses:  Aspiration pneumonia of right lower lobe, unspecified aspiration pneumonia type [facility])    Rx / DC Orders ED Discharge Orders    None       Larene Pickett, PA-C 06/11/20 0106    Orpah Greek, MD 06/11/20 [phone]

## 2020-06-10 NOTE — ED Triage Notes (Signed)
Pt coming with EMS from [facility] with staff concerns that pt is less verbal (dementia at baseline). Pt eating less per staff. Daughter is concerned that pt choked on food.

## 2020-06-11 ENCOUNTER — Encounter (HOSPITAL_COMMUNITY): Payer: Self-pay | Admitting: Family Medicine

## 2020-06-11 ENCOUNTER — Other Ambulatory Visit: Payer: Self-pay

## 2020-06-11 DIAGNOSIS — Z7189 Other specified counseling: Secondary | ICD-10-CM

## 2020-06-11 DIAGNOSIS — Z20822 Contact with and (suspected) exposure to covid-19: Secondary | ICD-10-CM | POA: Diagnosis present

## 2020-06-11 DIAGNOSIS — Z66 Do not resuscitate: Secondary | ICD-10-CM | POA: Diagnosis present

## 2020-06-11 DIAGNOSIS — F0390 Unspecified dementia without behavioral disturbance: Secondary | ICD-10-CM

## 2020-06-11 DIAGNOSIS — F0150 Vascular dementia without behavioral disturbance: Secondary | ICD-10-CM

## 2020-06-11 DIAGNOSIS — K219 Gastro-esophageal reflux disease without esophagitis: Secondary | ICD-10-CM | POA: Diagnosis present

## 2020-06-11 DIAGNOSIS — T17800A Unspecified foreign body in other parts of respiratory tract causing asphyxiation, initial encounter: Secondary | ICD-10-CM | POA: Diagnosis present

## 2020-06-11 DIAGNOSIS — E876 Hypokalemia: Secondary | ICD-10-CM | POA: Diagnosis present

## 2020-06-11 DIAGNOSIS — N400 Enlarged prostate without lower urinary tract symptoms: Secondary | ICD-10-CM | POA: Diagnosis present

## 2020-06-11 DIAGNOSIS — E870 Hyperosmolality and hypernatremia: Secondary | ICD-10-CM | POA: Diagnosis present

## 2020-06-11 DIAGNOSIS — E11649 Type 2 diabetes mellitus with hypoglycemia without coma: Secondary | ICD-10-CM | POA: Diagnosis present

## 2020-06-11 DIAGNOSIS — R531 Weakness: Secondary | ICD-10-CM | POA: Diagnosis not present

## 2020-06-11 DIAGNOSIS — G40909 Epilepsy, unspecified, not intractable, without status epilepticus: Secondary | ICD-10-CM | POA: Diagnosis present

## 2020-06-11 DIAGNOSIS — R54 Age-related physical debility: Secondary | ICD-10-CM | POA: Diagnosis present

## 2020-06-11 DIAGNOSIS — F1011 Alcohol abuse, in remission: Secondary | ICD-10-CM | POA: Diagnosis present

## 2020-06-11 DIAGNOSIS — B59 Pneumocystosis: Secondary | ICD-10-CM | POA: Diagnosis not present

## 2020-06-11 DIAGNOSIS — Z515 Encounter for palliative care: Secondary | ICD-10-CM

## 2020-06-11 DIAGNOSIS — I69391 Dysphagia following cerebral infarction: Secondary | ICD-10-CM | POA: Diagnosis not present

## 2020-06-11 DIAGNOSIS — I4510 Unspecified right bundle-branch block: Secondary | ICD-10-CM | POA: Diagnosis present

## 2020-06-11 DIAGNOSIS — Z681 Body mass index (BMI) 19 or less, adult: Secondary | ICD-10-CM | POA: Diagnosis not present

## 2020-06-11 DIAGNOSIS — R64 Cachexia: Secondary | ICD-10-CM | POA: Diagnosis present

## 2020-06-11 DIAGNOSIS — R1310 Dysphagia, unspecified: Secondary | ICD-10-CM | POA: Diagnosis present

## 2020-06-11 DIAGNOSIS — E861 Hypovolemia: Secondary | ICD-10-CM | POA: Diagnosis present

## 2020-06-11 DIAGNOSIS — J9601 Acute respiratory failure with hypoxia: Secondary | ICD-10-CM | POA: Diagnosis present

## 2020-06-11 DIAGNOSIS — J189 Pneumonia, unspecified organism: Secondary | ICD-10-CM | POA: Diagnosis present

## 2020-06-11 DIAGNOSIS — E871 Hypo-osmolality and hyponatremia: Secondary | ICD-10-CM | POA: Diagnosis present

## 2020-06-11 DIAGNOSIS — J690 Pneumonitis due to inhalation of food and vomit: Principal | ICD-10-CM

## 2020-06-11 DIAGNOSIS — I10 Essential (primary) hypertension: Secondary | ICD-10-CM

## 2020-06-11 DIAGNOSIS — E039 Hypothyroidism, unspecified: Secondary | ICD-10-CM | POA: Diagnosis present

## 2020-06-11 DIAGNOSIS — E43 Unspecified severe protein-calorie malnutrition: Secondary | ICD-10-CM | POA: Diagnosis present

## 2020-06-11 DIAGNOSIS — Z789 Other specified health status: Secondary | ICD-10-CM

## 2020-06-11 LAB — COMPREHENSIVE METABOLIC PANEL
ALT: 9 U/L (ref 0–44)
AST: 10 U/L — ABNORMAL LOW (ref 15–41)
Albumin: 2.1 g/dL — ABNORMAL LOW (ref 3.5–5.0)
Alkaline Phosphatase: 72 U/L (ref 38–126)
Anion gap: 11 (ref 5–15)
BUN: 40 mg/dL — ABNORMAL HIGH (ref 8–23)
CO2: 30 mmol/L (ref 22–32)
Calcium: 8.7 mg/dL — ABNORMAL LOW (ref 8.9–10.3)
Chloride: 107 mmol/L (ref 98–111)
Creatinine, Ser: 1.1 mg/dL (ref 0.61–1.24)
GFR, Estimated: >60 mL/min (ref >60)
Glucose, Bld: 136 mg/dL — ABNORMAL HIGH (ref 70–99)
Potassium: 3.2 mmol/L — ABNORMAL LOW (ref 3.5–5.1)
Sodium: 148 mmol/L — ABNORMAL HIGH (ref 135–145)
Total Bilirubin: 0.5 mg/dL (ref 0.3–1.2)
Total Protein: 6.3 g/dL — ABNORMAL LOW (ref 6.5–8.1)

## 2020-06-11 LAB — HIV ANTIBODY (ROUTINE TESTING W REFLEX): HIV Screen 4th Generation wRfx: NONREACTIVE

## 2020-06-11 LAB — RESP PANEL BY RT-PCR (FLU A&B, COVID) ARPGX2
Influenza A by PCR: NEGATIVE
Influenza B by PCR: NEGATIVE
SARS Coronavirus 2 by RT PCR: NEGATIVE

## 2020-06-11 LAB — BASIC METABOLIC PANEL
Anion gap: 13 (ref 5–15)
BUN: 37 mg/dL — ABNORMAL HIGH (ref 8–23)
CO2: 31 mmol/L (ref 22–32)
Calcium: 8.9 mg/dL (ref 8.9–10.3)
Chloride: 109 mmol/L (ref 98–111)
Creatinine, Ser: 1.1 mg/dL (ref 0.61–1.24)
GFR, Estimated: >60 mL/min (ref >60)
Glucose, Bld: 99 mg/dL (ref 70–99)
Potassium: 3.4 mmol/L — ABNORMAL LOW (ref 3.5–5.1)
Sodium: 153 mmol/L — ABNORMAL HIGH (ref 135–145)

## 2020-06-11 LAB — CBC
HCT: 35.5 % — ABNORMAL LOW (ref 39.0–52.0)
Hemoglobin: 10.4 g/dL — ABNORMAL LOW (ref 13.0–17.0)
MCH: 29.4 pg (ref 26.0–34.0)
MCHC: 29.3 g/dL — ABNORMAL LOW (ref 30.0–36.0)
MCV: 100.3 fL — ABNORMAL HIGH (ref 80.0–100.0)
Platelets: 314 10*3/uL (ref 150–400)
RBC: 3.54 MIL/uL — ABNORMAL LOW (ref 4.22–5.81)
RDW: 14.9 % (ref 11.5–15.5)
WBC: 11.4 10*3/uL — ABNORMAL HIGH (ref 4.0–10.5)
nRBC: 0 % (ref 0.0–0.2)

## 2020-06-11 LAB — MRSA PCR SCREENING: MRSA by PCR: NEGATIVE

## 2020-06-11 LAB — VALPROIC ACID LEVEL: Valproic Acid Lvl: 25 ug/mL — ABNORMAL LOW (ref 50.0–100.0)

## 2020-06-11 LAB — GLUCOSE, CAPILLARY: Glucose-Capillary: 130 mg/dL — ABNORMAL HIGH (ref 70–99)

## 2020-06-11 MED ORDER — TRAZODONE HCL 100 MG PO TABS
100.0000 mg | ORAL_TABLET | Freq: Every day | ORAL | Status: DC
  Administered 2020-06-11 – 2020-06-14 (×3): 100 mg via ORAL
  Filled 2020-06-11: qty 1

## 2020-06-11 MED ORDER — BACITRACIN-NEOMYCIN-POLYMYXIN OINTMENT TUBE
1.0000 "application " | TOPICAL_OINTMENT | Freq: Every day | CUTANEOUS | Status: DC
  Administered 2020-06-11 – 2020-06-15 (×6): 1 via TOPICAL
  Filled 2020-06-11: qty 1
  Filled 2020-06-11: qty 14

## 2020-06-11 MED ORDER — POTASSIUM CHLORIDE 10 MEQ/100ML IV SOLN
10.0000 meq | INTRAVENOUS | Status: AC
  Administered 2020-06-11 (×2): 10 meq via INTRAVENOUS
  Filled 2020-06-11: qty 100

## 2020-06-11 MED ORDER — PIPERACILLIN-TAZOBACTAM 3.375 G IVPB: 3.3750 g | Freq: Three times a day (TID) | INTRAVENOUS | Status: DC

## 2020-06-11 MED ORDER — POLYETHYLENE GLYCOL 3350 17 G PO PACK: 17.0000 g | PACK | Freq: Every day | ORAL | Status: DC | PRN

## 2020-06-11 MED ORDER — PIPERACILLIN-TAZOBACTAM 3.375 G IVPB 30 MIN
3.3750 g | Freq: Once | INTRAVENOUS | Status: AC
  Administered 2020-06-11: 3.375 g via INTRAVENOUS

## 2020-06-11 MED ORDER — SODIUM CHLORIDE 0.9% FLUSH
3.0000 mL | Freq: Two times a day (BID) | INTRAVENOUS | Status: DC
  Administered 2020-06-11 – 2020-06-15 (×6): 3 mL via INTRAVENOUS

## 2020-06-11 MED ORDER — ACETAMINOPHEN 325 MG PO TABS
325.0000 mg | ORAL_TABLET | Freq: Every day | ORAL | Status: DC
  Administered 2020-06-12 – 2020-06-15 (×4): 325 mg via ORAL
  Filled 2020-06-11 (×4): qty 1

## 2020-06-11 MED ORDER — POLYETHYL GLYCOL-PROPYL GLYCOL 0.4-0.3 % OP GEL: 1.0000 "application " | Freq: Two times a day (BID) | OPHTHALMIC | Status: DC

## 2020-06-11 MED ORDER — DIVALPROEX SODIUM 500 MG PO DR TAB
500.0000 mg | DELAYED_RELEASE_TABLET | Freq: Two times a day (BID) | ORAL | Status: DC
  Administered 2020-06-11 – 2020-06-15 (×8): 500 mg via ORAL
  Filled 2020-06-11 (×2): qty 1
  Filled 2020-06-11: qty 2
  Filled 2020-06-11 (×5): qty 1

## 2020-06-11 MED ORDER — LEVETIRACETAM 500 MG PO TABS
500.0000 mg | ORAL_TABLET | Freq: Two times a day (BID) | ORAL | Status: DC
  Administered 2020-06-11 – 2020-06-15 (×8): 500 mg via ORAL
  Filled 2020-06-11 (×8): qty 1

## 2020-06-11 MED ORDER — SENNA 8.6 MG PO TABS: 17.2000 mg | ORAL_TABLET | Freq: Every day | ORAL | Status: DC | PRN

## 2020-06-11 MED ORDER — MEMANTINE HCL-DONEPEZIL HCL ER 28-10 MG PO CP24: 1.0000 | ORAL_CAPSULE | Freq: Every morning | ORAL | Status: DC

## 2020-06-11 MED ORDER — ACETAMINOPHEN 325 MG PO TABS: 650.0000 mg | ORAL_TABLET | Freq: Four times a day (QID) | ORAL | Status: DC | PRN

## 2020-06-11 MED ORDER — PIPERACILLIN-TAZOBACTAM 3.375 G IVPB 30 MIN: 3.3750 g | Freq: Three times a day (TID) | INTRAVENOUS | Status: DC

## 2020-06-11 MED ORDER — SODIUM CHLORIDE 0.9% FLUSH
3.0000 mL | INTRAVENOUS | Status: DC | PRN
  Administered 2020-06-11: 3 mL via INTRAVENOUS

## 2020-06-11 MED ORDER — ACETAMINOPHEN 325 MG PO TABS: 325.0000 mg | ORAL_TABLET | ORAL | Status: DC

## 2020-06-11 MED ORDER — ATORVASTATIN CALCIUM 10 MG PO TABS
10.0000 mg | ORAL_TABLET | Freq: Every evening | ORAL | Status: DC
  Administered 2020-06-11 – 2020-06-14 (×4): 10 mg via ORAL
  Filled 2020-06-11 (×4): qty 1

## 2020-06-11 MED ORDER — POLYETHYL GLYCOL-PROPYL GLYCOL 0.4-0.3 % OP SOLN: 1.0000 "application " | OPHTHALMIC | Status: DC

## 2020-06-11 MED ORDER — MIRABEGRON ER 50 MG PO TB24
50.0000 mg | ORAL_TABLET | Freq: Every day | ORAL | Status: DC
  Administered 2020-06-11 – 2020-06-15 (×5): 50 mg via ORAL
  Filled 2020-06-11 (×5): qty 1

## 2020-06-11 MED ORDER — POTASSIUM CHLORIDE 20 MEQ PO PACK: 40.0000 meq | PACK | Freq: Once | ORAL | Status: DC

## 2020-06-11 MED ORDER — POLYVINYL ALCOHOL 1.4 % OP SOLN
1.0000 [drp] | Freq: Two times a day (BID) | OPHTHALMIC | Status: DC
  Filled 2020-06-11: qty 15

## 2020-06-11 MED ORDER — LEVOTHYROXINE SODIUM 75 MCG PO TABS
150.0000 ug | ORAL_TABLET | Freq: Every day | ORAL | Status: DC
  Administered 2020-06-11 – 2020-06-15 (×5): 150 ug via ORAL
  Filled 2020-06-11 (×5): qty 2

## 2020-06-11 MED ORDER — RESOURCE THICKENUP CLEAR PO POWD
ORAL | Status: DC | PRN
  Filled 2020-06-11: qty 125

## 2020-06-11 MED ORDER — BACITRACIN-POLYMYXIN B 500-10000 UNIT/GM OP OINT
TOPICAL_OINTMENT | Freq: Two times a day (BID) | OPHTHALMIC | Status: DC
  Filled 2020-06-11: qty 3.5

## 2020-06-11 MED ORDER — BACLOFEN 10 MG PO TABS
10.0000 mg | ORAL_TABLET | Freq: Three times a day (TID) | ORAL | Status: DC
  Administered 2020-06-11 – 2020-06-15 (×12): 10 mg via ORAL
  Filled 2020-06-11 (×13): qty 1

## 2020-06-11 MED ORDER — PIPERACILLIN-TAZOBACTAM 3.375 G IVPB
3.3750 g | Freq: Three times a day (TID) | INTRAVENOUS | Status: DC
  Administered 2020-06-11 – 2020-06-15 (×12): 3.375 g via INTRAVENOUS
  Filled 2020-06-11 (×15): qty 50

## 2020-06-11 MED ORDER — DONEPEZIL HCL 10 MG PO TABS
10.0000 mg | ORAL_TABLET | Freq: Every day | ORAL | Status: DC
  Administered 2020-06-11 – 2020-06-15 (×5): 10 mg via ORAL
  Filled 2020-06-11 (×5): qty 1

## 2020-06-11 MED ORDER — PANTOPRAZOLE SODIUM 40 MG PO TBEC
40.0000 mg | DELAYED_RELEASE_TABLET | Freq: Every day | ORAL | Status: DC
  Administered 2020-06-11 – 2020-06-14 (×3): 40 mg via ORAL
  Filled 2020-06-11 (×3): qty 1

## 2020-06-11 MED ORDER — PIPERACILLIN-TAZOBACTAM 3.375 G IVPB
3.3750 g | Freq: Three times a day (TID) | INTRAVENOUS | Status: DC
  Filled 2020-06-11: qty 50

## 2020-06-11 MED ORDER — SODIUM CHLORIDE 0.9 % IV SOLN
250.0000 mL | INTRAVENOUS | Status: DC | PRN
Start: 2020-06-11 — End: 2020-06-15

## 2020-06-11 MED ORDER — POLYVINYL ALCOHOL 1.4 % OP SOLN
1.0000 [drp] | OPHTHALMIC | Status: DC
  Administered 2020-06-11 – 2020-06-15 (×19): 1 [drp] via OPHTHALMIC
  Filled 2020-06-11: qty 15

## 2020-06-11 MED ORDER — ESCITALOPRAM OXALATE 20 MG PO TABS
30.0000 mg | ORAL_TABLET | Freq: Every day | ORAL | Status: DC
  Administered 2020-06-11 – 2020-06-15 (×5): 30 mg via ORAL
  Filled 2020-06-11 (×2): qty 1
  Filled 2020-06-11: qty 3
  Filled 2020-06-11 (×2): qty 1

## 2020-06-11 MED ORDER — HEPARIN SODIUM (PORCINE) 5000 UNIT/ML IJ SOLN
5000.0000 [IU] | Freq: Three times a day (TID) | INTRAMUSCULAR | Status: DC
  Administered 2020-06-11 – 2020-06-15 (×13): 5000 [IU] via SUBCUTANEOUS
  Filled 2020-06-11 (×11): qty 1

## 2020-06-11 MED ORDER — MEMANTINE HCL ER 28 MG PO CP24
28.0000 mg | ORAL_CAPSULE | Freq: Every day | ORAL | Status: DC
Start: 2020-06-11 — End: 2020-06-15
  Administered 2020-06-11 – 2020-06-15 (×5): 28 mg via ORAL
  Filled 2020-06-11 (×5): qty 1

## 2020-06-11 NOTE — Plan of Care (Signed)
  Problem: Health Behavior/Discharge Planning: Goal: Ability to manage health-related needs will improve Outcome: Progressing   

## 2020-06-11 NOTE — Consult Note (Signed)
Consultation Note Date: 06/11/2020   Patient Name: Willie [name]  DOB: 1940/11/17  MRN: [id number]  Age / Sex: 79 y.o., male  PCP: Harvel Quale, NP Referring Physician: Kayleen Memos, DO  Reason for Consultation: Establishing goals of care  HPI/Patient Profile: 79 y.o. male  with past medical history of dementia, stroke, seizures, prostate cancer, DM, HTN, remote history of ETOH abuse, and chronic dysphagia presented to the ED on 06/10/20 from [facility] with staff concerns of patient being less interactive and with decreased PO intake. Staff noted day prior he had a choking episode. Patient was admitted on 06/10/2020 with aspiration pneumonia.   ED Course: On arrival in the ED his oxygenation was 88% on room air and he was placed on 2 L with improvement.  He was also noted to have right lower lobe pneumonia most likely from aspiration and was started on Zosyn in the emergency department.  He had pulled out his IV and most of the Zosyn must have been wasted.  He has a mild leukocytosis.  Patient and family face treatment option decisions, advanced directive decisions, and anticipatory care needs.  Clinical Assessment and Goals of Care: I have reviewed medical records including EPIC notes, labs, and imaging. Received report from primary RN - no acute concerns.   Went to visit patient at bedside - daughter in law/Julie was present. Patient was lying in bed awake, alert, disoriented, and able to participate in simple conversation. Patient is not able to make complex medical decisions. No signs or non-verbal gestures of pain or discomfort noted. No respiratory distress, increased work of breathing, or secretions noted. Patient denied pain or shortness of breath - he is on acute 5L O2 [place]. He slept for the majority of visit today.  Met with daughter in law/Julie in person, joined by son/Dean and  granddaughter/Heather via speakerphone  to discuss diagnosis, prognosis, GOC, EOL wishes, disposition, and options.  I introduced Palliative Medicine as specialized medical care for people living with serious illness. It focuses on providing relief from the symptoms and stress of a serious illness. The goal is to improve quality of life for both the patient and the family.  We discussed a brief life review of the patient as well as functional and nutritional status. The patient divorced many years ago - he and his ex-wife had two sons together; unfortunately one of the sons has passed away leaving Marlou Sa as the only surviving child. The family states the patient was first diagnosed with dementia about 10 years ago, even though they feel like he was experiencing mild symptoms prior to that. They saw a gradual decline overtime, but really noticed a decline when COVID hit and the patient was more isolated. At that time, he was in independent living - since then he started experiencing increased falls, has had rehab stays, multiple rehospitalizations, and has been in memory care. Prior to this hospitalization, the patient was living at [facility] in [place] and has been there since September 2021. The family states  the patient was driving 1.5 years ago, but as of two months ago is no longer able to walk - he is bedbound and requires heavy assistance for transferring to his wheelchair. The patient is not able to dress or bathe himself, but he is still able to feed himself.   We discussed patient's current illness and what it means in the larger context of patient's on-going co-morbidities. The family have a clear understanding of the patient's current medical situation. The family also understands that dementia is a progressive, non-curable disease underlying the patient's current acute medical conditions. We discussed the risk of recurrent aspiration pneumonia in context of dysphagia due to progressing dementia,  which leads to increased risk of rehospitalizations. Reviewed SLP evaluation that revealed the patient's severe risk for aspiration.  Natural disease trajectory of dementia and expectations at EOL were discussed. I attempted to elicit values and goals of care important to the patient. The difference between aggressive medical intervention and comfort care was considered in light of the patient's goals of care. Education was provided on the difference between outpatient Palliative Care vs hospice - family expressed they wanted more information on hospice. I introduced and reviewed hospice philosophy and services in detail, including home hospice at the patient's facility as well as if/when needed, he could transition to residential hospice facility if desired when life expectancy was <2 weeks. Family expressed interest in having the patient discharge back to [facility] with hospice support - explained we needed to check and make sure facility could support patient at EOL - family is interested in checking. Family requested [facility] [place]. The goal while patient is admitted is to medically optimize to help the patient feel as best he can prior to discharge.  Advance directives, concepts specific to code status, artificial feeding and hydration, and rehospitalization were considered and discussed. The patient has his HCPOA on file under [facility]. Encouraged family to consider DNR/DNI status understanding evidenced based poor outcomes in similar hospitalized patient, as the cause of arrest is likely associated with advanced chronic/terminal illness rather than an easily reversible acute cardio-pulmonary event. Family were agreeable to DNR/DNI with understanding that patient would not receive CPR, defibrillation, ACLS medications, or intubation. Family clearly expressed they would not want heroic interventions. We discussed that evidence has shown that PEG tubes in patients with advanced dementia do not increase  survival, prevent aspiration, or improve wound healing.  They can promote isolation and use of restraints leading to increased potential for pressure ulcers. Use of PEG tubes is not medically recommended in this population; rather, careful hand feeding with aspiration precautions has been shown to provide the best quality of life. Family stated they knew the patient would not want a feeding tube and know it is not something they wanted to pursue. Introduced, reviewed, and completed MOST form as outlined under Recommendation section below.   Discussed with patient/family the importance of continued conversation with each other and the medical providers regarding overall plan of care and treatment options, ensuring decisions are within the context of the patient's values and GOCs.    Questions and concerns were addressed. The patient/family was encouraged to call with questions and/or concerns. PMT card was provided.   Primary Decision Maker: HCPOA - son/Larry "Suzi Roots    SUMMARY OF RECOMMENDATIONS:  Continue current medical treatment  Initiated DNR/DNI - durable DNR completed and placed in shadow chart, copy will be scanned into [facility]  Goal is for medical optimization and then discharge back to  [facility] with hospice  If patient declines, family is open to comfort measures in house  [facility] consulted: to make sure [facility] can support patient at EOL with hospice, family requesting [facility] [place], daughter in Sports coach, is primary contact for needs/coordination of care  MOST form completed as follows: DNR/DNI, Comfort Measures, Determine use or limitation of antibiotics when infection occurs, No IV fluids, No feeding tube. Original placed in shadow chart, copy will be scanned into [facility].  PMT will continue to follow peripherally. If there are any imminent needs please call the service directly   Code Status/Advance Care Planning:  DNR  Palliative Prophylaxis:    Aspiration, Bowel Regimen, Delirium Protocol, Frequent Pain Assessment, Oral Care and Turn Reposition  Additional Recommendations (Limitations, Scope, Preferences):  Full Scope Treatment, No Artificial Feeding and No Tracheostomy  Psycho-social/Spiritual:   Desire for further Chaplaincy support:no Created space and opportunity for patient and family to express thoughts and feelings regarding patient's current medical situation.   Emotional support and therapeutic listening provided.  Prognosis:   < 6 months  Discharge Planning: [place] with Hospice      Primary Diagnoses: Present on Admission: . Pneumonia . Controlled type 2 diabetes mellitus with hypoglycemia ([place]) . HTN (hypertension) . Hypernatremia . Hypokalemia . Aspiration into lower respiratory tract, initial encounter   I have reviewed the medical record, interviewed the patient and family, and examined the patient. The following aspects are pertinent.  Past Medical History:  Diagnosis Date  . Alcohol abuse   . Benign prostatic hypertrophy   . Dementia [facility])    daughter-in-law does not know of this   . Diabetes mellitus   . Encephalopathy   . GERD (gastroesophageal reflux disease)   . History of tarsorrhaphy    right eye  . Hypertension   . Hypothyroidism   . Hypothyroidism   . Pneumonia   . Prostate cancer ([place])   . Seizures ([place])   . Stroke [facility])    right eye does not shut all the way-tarsorrhaphy   Social History   Socioeconomic History  . Marital status: Divorced    Spouse name: Not on file  . Number of children: 2  . Years of education: Not on file  . Highest education level: Not on file  Occupational History  . Not on file  Tobacco Use  . Smoking status: Never Smoker  . Smokeless tobacco: Never Used  Vaping Use  . Vaping Use: Never used  Substance and Sexual Activity  . Alcohol use: Not Currently  . Drug use: No  . Sexual activity: Not Currently  Other Topics  Concern  . Not on file  Social History Narrative   Patient resides at [facility] in [place].   Right handed   Caffeine No   Social Determinants of Health   Financial Resource Strain: Not on file  Food Insecurity: Not on file  Transportation Needs: Not on file  Physical Activity: Not on file  Stress: Not on file  Social Connections: Not on file   Family History  Problem Relation Age of Onset  . Prostate cancer Brother   . Colon cancer Brother   . Breast cancer Neg Hx   . Pancreatic cancer Neg Hx    Scheduled Meds: . acetaminophen  325 mg Oral Daily  . atorvastatin  10 mg Oral QPM  . bacitracin-polymyxin b   Right Eye BID  . baclofen  10 mg Oral TID  . divalproex  500  mg Oral BID  . memantine  28 mg Oral Daily   And  . donepezil  10 mg Oral Daily  . escitalopram  30 mg Oral Daily  . heparin  5,000 Units Subcutaneous Q8H  . levETIRAcetam  500 mg Oral BID  . levothyroxine  150 mcg Oral QAC breakfast  . mirabegron ER  50 mg Oral Daily  . neomycin-bacitracin-polymyxin  1 application Topical Daily  . pantoprazole  40 mg Oral QHS  . polyvinyl alcohol  1 drop Both Eyes Q4H while awake  . potassium chloride  40 mEq Oral Once  . sodium chloride flush  3 mL Intravenous Q12H  . traZODone  100 mg Oral QHS   Continuous Infusions: . sodium chloride    . piperacillin-tazobactam (ZOSYN)  IV 3.375 g (06/11/20 1507)   PRN Meds:.sodium chloride, acetaminophen, polyethylene glycol, Resource ThickenUp Clear, senna, sodium chloride flush Medications Prior to Admission:  Prior to Admission medications   Medication Sig Start Date End Date Taking? Authorizing Provider  acetaminophen (TYLENOL) 325 MG tablet Take 325 mg by mouth in the morning.   Yes [provider]  Artificial Tear Solution (GENTEAL TEARS) 0.1-0.2-0.3 % SOLN Place 1 drop into both eyes in the morning and at bedtime.   Yes [provider]  baclofen (LIORESAL) 10 MG tablet Take 10 mg by mouth 3 (three)  times daily. 06/14/19  Yes [provider]  divalproex (DEPAKOTE SPRINKLE) 125 MG capsule Take 500 mg by mouth 2 (two) times daily. 05/31/20  Yes [provider]  escitalopram (LEXAPRO) 20 MG tablet Take 1 and 1/2 tablets daily 04/16/20  Yes Cameron Sprang, MD  levETIRAcetam (KEPPRA) 500 MG tablet Take 1 tablet (500 mg total) by mouth 2 (two) times daily. Patient taking differently: Take 500 mg by mouth in the morning and at bedtime. 01/30/20  Yes Cameron Sprang, MD  LORazepam (ATIVAN) 1 MG tablet Give 1 tablet as needed for abnormal movements lasting more than 15 minutes. Do not give more than 2-3 times a week Patient taking differently: Take 1 mg by mouth daily as needed for anxiety. 02/03/20  Yes Cameron Sprang, MD  Memantine HCl-Donepezil HCl [facility]) 28-10 MG CP24 Take 1 capsule by mouth daily. Patient taking differently: Take 1 capsule by mouth in the morning. 08/26/19  Yes Nita Sells, MD  mirabegron ER (MYRBETRIQ) 50 MG TB24 tablet Take 50 mg by mouth daily.    Yes [provider]  neomycin-bacitracin-polymyxin (NEOSPORIN) [phone] ointment Apply 1 application topically See admin instructions. Apply topically to left elbow once daily for open area- clean area first and apply a Band-Aid afterwards   Yes [provider]  nystatin cream (MYCOSTATIN) Apply 1 application topically in the morning and at bedtime. Apply to face until heal 06/08/20  Yes [provider]  pantoprazole (PROTONIX) 40 MG tablet Take 1 tablet (40 mg total) by mouth at bedtime. 08/26/19  Yes Nita Sells, MD  Polyethyl Glycol-Propyl Glycol (SYSTANE) 0.4-0.3 % GEL ophthalmic gel Place 1 application into both eyes in the morning and at bedtime.   Yes [provider]  Polyethyl Glycol-Propyl Glycol (SYSTANE) 0.4-0.3 % SOLN Place 1 application into both eyes every 4 (four) hours.   Yes [provider]  polyethylene glycol (MIRALAX / GLYCOLAX) 17 g  packet Take 17 g by mouth daily as needed (for constipation- MIX AND DRINK).    Yes [provider]  Sennosides 15 MG TABS Take 15 mg by mouth daily as needed (  for constipation).   Yes [provider]  Skin Protectants, Misc. (BAZA PROTECT EX) Apply 1 application topically in the morning, at noon, and at bedtime. Apply to sacrum/buttocks for skin breakdown   Yes [provider]  SYNTHROID 150 MCG tablet Take 150 mcg by mouth daily before breakfast.   Yes [provider]  traZODone (DESYREL) 100 MG tablet Take 1 tablet (100 mg total) by mouth at bedtime. 08/26/19  Yes Nita Sells, MD   Allergies  Allergen Reactions  . Aspirin Other (See Comments)    Had stroke in March, 2015- NO more aspirin!!   . Rapaflo [Silodosin] Other (See Comments)    "Allergic," per [facility]   Review of Systems  Unable to perform ROS: Dementia    Physical Exam Vitals and nursing note reviewed.  Constitutional:      General: He is not in acute distress.    Appearance: He is cachectic.  Pulmonary:     Effort: No respiratory distress.  Skin:    General: Skin is warm and dry.  Neurological:     Mental Status: He is easily aroused. He is lethargic.     Motor: Weakness present.  Psychiatric:        Behavior: Behavior is cooperative.        Cognition and Memory: Cognition is impaired. Memory is impaired.     Vital Signs: BP (!) 138/54   Pulse 95   Temp 98.8 F (37.1 C)   Resp (!) 24   SpO2 93%  Pain Scale: PAINAD       SpO2: SpO2: 93 % O2 Device:SpO2: 93 % O2 Flow Rate: .O2 Flow Rate (L/min): 6 L/min  IO: Intake/output summary:   Intake/Output Summary (Last 24 hours) at 06/11/2020 1637 Last data filed at 06/11/2020 1033 Gross per 24 hour  Intake 125 ml  Output --  Net 125 ml    LBM:   Baseline Weight:   Most recent weight:       Palliative Assessment/Data: PPS 20-30%     Time In: 1745 Time Out: 1905 Time Total: 80 minutes  Greater than 50%   of this time was spent counseling and coordinating care related to the above assessment and plan.  Signed by: Lin Landsman, NP   Please contact Palliative Medicine Team phone at [phone] for questions and concerns.  For individual provider: See Shea [name]

## 2020-06-11 NOTE — Progress Notes (Signed)
New Admission Note:   Arrival Method: strretcher Mental Orientation:  Telemetry: Assessment: Completed Skin: stage 2 on sacrum  IV: Right forearm  Pain: 0/10  Tubes: nasal canula  Safety Measures: Safety Fall Prevention Plan has been given, discussed and signed Admission: Completed [facility] [place] Orientation: Patient has been orientated to the room, unit and staff.  Family: Daughter in law   Orders have been reviewed and implemented. Will continue to monitor the patient. Call light has been placed within reach and bed alarm has been activated.   Makelle Marrone RN [facility] Renal Phone: [phone]

## 2020-06-11 NOTE — Progress Notes (Signed)
Willie [name] is a 79 y.o. male with medical history significant for 79 year old male with history  dementia diabetes, GERD and hypertension, hypothyroidism seizures, prior stroke and remote history of alcohol abuse who presented to the emergency department because he was noted to not be acting quite himself feeling weak.  Yesterday at dinner he had a choking episode.  He was already on mechanical soft diet with nectar thick liquid but he is family think he may have been aspirating slowly or silently.  They deny any fever but he had experienced poor appetite since today.  He denies any fever but his daughter-in-law who is at bedside noted that his right eye which she had was a little redder than the right and she is concerned he may have an infection they did note some discharge.  Daughter-in-law notes that she tries to get out of bed and every time he is admitted to the hospital he pulls out his IV and he is not as mobile as he used to be.  He also requires quite a lot of encouragement to eat  ED Course: On arrival in the ED his oxygenation was 88% on room air and he was placed on 2 L with improvement.  He was also noted to have right lower lobe pneumonia most likely from aspiration and was started on Zosyn in the emergency department.  He had pulled out his IV and most of the Zosyn must have been wasted.  He has a mild leukocytosis.  06/11/20: Patient was seen and examined in the ED.  He is minimally interactive.  Alert, nonverbal, does not answer questions.  Does not appear to be in acute distress however he is severely malnourished.  He was seen by speech therapist, reported severe dysphagia.  Speech therapist discussed with patient's son about his severe risk of aspiration.  His son agrees to continue with pured diet and honey thick liquids which is the most restrictive, safest diet we can manage.  Speech therapist will continue to follow.  Appreciate assistance.    Patient is a full code.   Palliative care team has been consulted to assist with establishing goals of care.  Aspiration precautions is in place.  He is currently on Zosyn for aspiration pneumonia.  Please refer to H&P dictated by my partner Dr. Barrie Folk on 06/11/2020 for further details of the assessment and plan.

## 2020-06-11 NOTE — ED Notes (Signed)
Dressing changed on coccyx

## 2020-06-11 NOTE — Evaluation (Signed)
Clinical/Bedside Swallow Evaluation Patient Details  Name: Willie [name] MRN: [id number] Date of Birth: [birth year]  Today's Date: 06/11/2020 Time: SLP Start Time (ACUTE ONLY): 1330 SLP Stop Time (ACUTE ONLY): 1100 SLP Time Calculation (min) (ACUTE ONLY): 1290 min  Past Medical History:  Past Medical History:  Diagnosis Date  . Alcohol abuse   . Benign prostatic hypertrophy   . Dementia [facility])    daughter-in-law does not know of this   . Diabetes mellitus   . Encephalopathy   . GERD (gastroesophageal reflux disease)   . History of tarsorrhaphy    right eye  . Hypertension   . Hypothyroidism   . Hypothyroidism   . Pneumonia   . Prostate cancer ([place])   . Seizures ([employer])   . Stroke [facility])    right eye does not shut all the way-tarsorrhaphy   Past Surgical History:  Past Surgical History:  Procedure Laterality Date  . COLONOSCOPY WITH PROPOFOL N/A 08/24/2014   Procedure: COLONOSCOPY WITH PROPOFOL;  Surgeon: Garlan Fair, MD;  Location[facility];  Service: Endoscopy;  Laterality: N/A;  . EYE SURGERY     Laser eye surgery  . GOLD SEED IMPLANT N/A 04/18/2019   Procedure: GOLD SEED IMPLANT;  Surgeon: Kathie Rhodes, MD;  Location: [facility];  Service: Urology;  Laterality: N/A;  . PROSTATE BIOPSY    . SPACE OAR INSTILLATION N/A 04/18/2019   Procedure: SPACE OAR INSTILLATION;  Surgeon: Kathie Rhodes, MD;  [facility];  Service: Urology;  Laterality: N/A;   HPI:  [name] is a 79 y.o. male with medical history significant for 79 year old male with history  dementia diabetes, GERD and hypertension, hypothyroidism seizures, prior stroke and remote history of alcohol abuse who presented to the emergency department because he was noted to not be acting quite himself feeling weak.  Yesterday at dinner he had a choking episode.  He was already on mechanical soft diet with nectar thick liquid but he is family think he may have been aspirating slowly or silently. Pt has been on  nectar thick liquids since march of 2021 after clnical assessment showed frequent throat clearing with thin. No MBS due to mentation. Currently CXR shows moderate patchy opacities throughout the right lower lung zone  suspicious for pneumonia, including aspiration.   Assessment / Plan / Recommendation Clinical Impression  Pt demonstrates increased clinical signs of aspiration in comparison with documentation from 3/21. At that time pt was clearing his throat with thin liquids. Now pt has a wet congested cough immediately following nectar and honey thick liquids and occasionally with puree. He is likely aspirating to some degree with all PO. Pt has dementia, can follow simple commands but is restless and would be unable to follow any complex swallowing maneuvers.  He is able to take his meds whole in puree.  Discussed findings with pts son who reports an awareness of pts struggle with swallowing and nutrition and states he is "in the final stages" of dementia and end of life. His son agrees to continue a puree diet and honey thick liquids, which is the most restrictive, safest diet we can manage. Reported events to RN and MD. Will f/u acutely as needed. SLP Visit Diagnosis: Dysphagia, oropharyngeal phase (R13.12)    Aspiration Risk  Severe aspiration risk;Risk for inadequate nutrition/hydration    Diet Recommendation Dysphagia 1 (Puree);Honey-thick liquid   Liquid Administration via: Cup;No straw Medication Administration: Whole meds with puree Supervision: Staff to assist with self feeding Compensations: Slow  rate;Small sips/bites;Minimize environmental distractions Postural Changes: Seated upright at 90 degrees    Other  Recommendations Oral Care Recommendations: Oral care BID   Follow up Recommendations Skilled Nursing facility      Frequency and Duration min 2x/week  2 weeks       Prognosis Prognosis for Safe Diet Advancement: Guarded Barriers to Reach Goals: Severity of deficits       Swallow Study   General HPI: [name] is a 79 y.o. male with medical history significant for 79 year old male with history  dementia diabetes, GERD and hypertension, hypothyroidism seizures, prior stroke and remote history of alcohol abuse who presented to the emergency department because he was noted to not be acting quite himself feeling weak.  Yesterday at dinner he had a choking episode.  He was already on mechanical soft diet with nectar thick liquid but he is family think he may have been aspirating slowly or silently. Pt has been on nectar thick liquids since march of 2021 after clnical assessment showed frequent throat clearing with thin. No MBS due to mentation. Currently CXR shows moderate patchy opacities throughout the right lower lung zone  suspicious for pneumonia, including aspiration. Type of Study: Bedside Swallow Evaluation Diet Prior to this Study: Nectar-thick liquids;Dysphagia 2 (chopped) Respiratory Status: Nasal cannula;Non-rebreather History of Recent Intubation: No Behavior/Cognition: Alert;Cooperative;Requires cueing Oral Cavity Assessment: Dried secretions Oral Care Completed by SLP: Yes Oral Cavity - Dentition: Edentulous;Dentures, not available Self-Feeding Abilities: Needs assist Patient Positioning: Upright in bed Baseline Vocal Quality: Normal Volitional Cough: Congested Volitional Swallow: Able to elicit    Oral/Motor/Sensory Function Overall Oral Motor/Sensory Function: Moderate impairment Facial ROM: Reduced right;Suspected CN VII (facial) dysfunction Facial Symmetry: Suspected CN VII (facial) dysfunction;Abnormal symmetry right Lingual ROM: Reduced right;Suspected CN XII (hypoglossal) dysfunction Lingual Symmetry: Abnormal symmetry right;Suspected CN XII (hypoglossal) dysfunction   Ice Chips     Thin Liquid Thin Liquid: Not tested    Nectar Thick Nectar Thick Liquid: Impaired Presentation: Cup;Straw Pharyngeal Phase Impairments: Cough -  Immediate   Honey Thick Honey Thick Liquid: Impaired Presentation: Cup Pharyngeal Phase Impairments: Cough - Immediate   Puree Puree: Impaired Pharyngeal Phase Impairments: Cough - Immediate   Solid     Solid: Not tested     Harlon Ditty, MA CCC-SLP  Acute Rehabilitation Services Pager [phone] Office [phone]  Claudine Mouton 06/11/2020,11:02 AM

## 2020-06-11 NOTE — ED Notes (Signed)
Waiting on thicken up for oral meds

## 2020-06-11 NOTE — H&P (Addendum)
History and Physical  Willie [name] [id number] DOB: May 16, 1941 DOA: 06/10/2020  Referring physician: Larene Pickett, PA-C PCP: Harvel Quale, NP  Outpatient Specialists:  Patient coming from: SNF, [place] & is not able to ambulate   Chief Complaint: Generalized weakness, choking  HPI: Willie [name] is a 79 y.o. male with medical history significant for 79 year old male with history  dementia diabetes, GERD and hypertension, hypothyroidism seizures, prior stroke and remote history of alcohol abuse who presented to the emergency department because he was noted to not be acting quite himself feeling weak.  Yesterday at dinner he had a choking episode.  He was already on mechanical soft diet with nectar thick liquid but he is family think he may have been aspirating slowly or silently.  They deny any fever but he had experienced poor appetite since today.  He denies any fever but his daughter-in-law who is at bedside noted that his right eye which she had was a little redder than the right and she is concerned he may have an infection they did note some discharge.  Daughter-in-law notes that she tries to get out of bed and every time he is admitted to the hospital he pulls out his IV and he is not as mobile as he used to be.  He also requires quite a lot of encouragement to eat  ED Course: On arrival in the ED his oxygenation was 88% on room air and he was placed on 2 L with improvement.  He was also noted to have right lower lobe pneumonia most likely from aspiration and was started on Zosyn in the emergency department.  He had pulled out his IV and most of the Zosyn must have been wasted.  He has a mild leukocytosis.  Review of Systems: No fever Positive for generalized weakness poor appetite.  He does have seizure-like activity where he has episodes of shaking but he had had an EGD that was normal in the past and so that is normal for him. Pt complains of patient was able to vocalize  with me to how you doing and he did vocalize and asked his daughter-in-law if he was going to leave him alone here at which she said no she was not she was going to be right there with him   .  Review of systems are otherwise negative   Past Medical History:  Diagnosis Date  . Alcohol abuse   . Benign prostatic hypertrophy   . Dementia [facility])    daughter-in-law does not know of this   . Diabetes mellitus   . Encephalopathy   . GERD (gastroesophageal reflux disease)   . History of tarsorrhaphy    right eye  . Hypertension   . Hypothyroidism   . Hypothyroidism   . Pneumonia   . Prostate cancer ([place])   . Seizures ([place])   . Stroke [facility])    right eye does not shut all the way-tarsorrhaphy   Past Surgical History:  Procedure Laterality Date  . COLONOSCOPY WITH PROPOFOL N/A 08/24/2014   Procedure: COLONOSCOPY WITH PROPOFOL;  Surgeon: Garlan Fair, MD;  [facility];  Service: Endoscopy;  Laterality: N/A;  . EYE SURGERY     Laser eye surgery  . GOLD SEED IMPLANT N/A 04/18/2019   Procedure: GOLD SEED IMPLANT;  Surgeon: Kathie Rhodes, MD;  [facility];  Service: Urology;  Laterality: N/A;  . PROSTATE BIOPSY    . SPACE OAR INSTILLATION N/A 04/18/2019   Procedure:  SPACE OAR INSTILLATION;  Surgeon: Ihor Gully, MD;  [facility];  Service: Urology;  Laterality: N/A;    Social History:  reports that he has never smoked. He has never used smokeless tobacco. He reports previous alcohol use. He reports that he does not use drugs.   Allergies  Allergen Reactions  . Aspirin Other (See Comments)    Had stroke in March, 2015- NO more aspirin!!   . Rapaflo [Silodosin] Other (See Comments)    "Allergic," per [facility]    Family History  Problem Relation Age of Onset  . Prostate cancer Brother   . Colon cancer Brother   . Breast cancer Neg Hx   . Pancreatic cancer Neg Hx       Prior to Admission medications   Medication Sig Start Date End Date Taking? Authorizing  Provider  acetaminophen (TYLENOL) 325 MG tablet Take 325-650 mg by mouth See admin instructions. Take 325 mg by mouth in the morning and an additional 650 mg every six hours as needed for pain    [provider]  Artificial Tear Solution (GENTEAL TEARS) 0.1-0.2-0.3 % SOLN Place 1 drop into both eyes in the morning and at bedtime.    [provider]  atorvastatin (LIPITOR) 10 MG tablet Take 10 mg by mouth every evening.     [provider]  baclofen (LIORESAL) 10 MG tablet Take 10 mg by mouth 3 (three) times daily. 06/14/19   [provider]  divalproex (DEPAKOTE) 250 MG DR tablet Take 1 tablet twice a day for 1 week, then increase to 2 tablets twice a day 04/16/20   Van Clines, MD  escitalopram (LEXAPRO) 20 MG tablet Take 1 and 1/2 tablets daily 04/16/20   Van Clines, MD  levETIRAcetam (KEPPRA) 500 MG tablet Take 1 tablet (500 mg total) by mouth 2 (two) times daily. Patient taking differently: Take 500 mg by mouth in the morning and at bedtime.  01/30/20   Van Clines, MD  LORazepam (ATIVAN) 1 MG tablet Give 1 tablet as needed for abnormal movements lasting more than 15 minutes. Do not give more than 2-3 times a week Patient taking differently: Take 1 mg by mouth daily as needed for anxiety.  02/03/20   Van Clines, MD  Memantine HCl-Donepezil HCl [facility]) 28-10 MG CP24 Take 1 capsule by mouth daily. Patient taking differently: Take 1 capsule by mouth in the morning.  08/26/19   Rhetta Mura, MD  mirabegron ER (MYRBETRIQ) 50 MG TB24 tablet Take 50 mg by mouth daily.     [provider]  neomycin-bacitracin-polymyxin (NEOSPORIN) [phone] ointment Apply 1 application topically See admin instructions. Apply topically to left elbow once daily for open area- clean area first and apply a Band-Aid afterwards    [provider]  pantoprazole (PROTONIX) 40 MG tablet Take 1 tablet (40 mg total) by mouth at bedtime. 08/26/19   Rhetta Mura, MD  Polyethyl Glycol-Propyl Glycol (SYSTANE) 0.4-0.3 % GEL ophthalmic gel Place 1 application into both eyes in the morning and at bedtime.    [provider]  Polyethyl Glycol-Propyl Glycol (SYSTANE) 0.4-0.3 % SOLN Place 1 application into both eyes every 4 (four) hours.    [provider]  polyethylene glycol (MIRALAX / GLYCOLAX) 17 g packet Take 17 g by mouth daily as needed (for constipation- MIX AND DRINK).     [provider]  Sennosides 15 MG TABS Take 15 mg by mouth daily as needed (for constipation).  [provider]  SYNTHROID 150 MCG tablet Take 150 mcg by mouth daily before breakfast.    [provider]  traZODone (DESYREL) 100 MG tablet Take 1 tablet (100 mg total) by mouth at bedtime. 08/26/19   Nita Sells, MD    Physical Exam: BP (!) 150/78   Pulse 84   Temp 97.8 F (36.6 C) (Oral)   Resp 10   SpO2 97%   Exam:  . General: 79 y.o. year-old male well developed very poorly nourished cachectic with scaphoid abdomen.  In no acute distress.  Alert and oriented x1 . HEENT: Right eye lead is partially short he has conjunctival injection with small crusty discharge . Cardiovascular: Regular rate and rhythm with no rubs or gallops.  No thyromegaly or JVD noted.   Marland Kitchen Respiratory: Right middle lobe Rales noted.. Good inspiratory effort. . Abdomen: Soft scaphoid nontender nondistended with normal bowel sounds x4 quadrants. . Musculoskeletal: No lower extremity edema. 2/4 pulses in all 4 extremities. . Skin: No ulcerative lesions noted or rashes, . Psychiatry: Mood is appropriate for condition and setting           Labs on Admission:  Basic Metabolic Panel: Recent Labs  Lab 06/10/20 2247  NA 148*  K 3.2*  CL 107  CO2 30  GLUCOSE 136*  BUN 40*  CREATININE 1.10  CALCIUM 8.7*   Liver Function Tests: Recent Labs  Lab 06/10/20 2247  AST 10*  ALT 9  ALKPHOS 72  BILITOT 0.5  PROT 6.3*  ALBUMIN 2.1*    No results for input(s): LIPASE, AMYLASE in the last 168 hours. No results for input(s): AMMONIA in the last 168 hours. CBC: Recent Labs  Lab 06/10/20 2247  WBC 10.7*  NEUTROABS 8.5*  HGB 10.4*  HCT 33.2*  MCV 97.4  PLT 307   Cardiac Enzymes: No results for input(s): CKTOTAL, CKMB, CKMBINDEX, TROPONINI in the last 168 hours.  BNP (last 3 results) No results for input(s): BNP in the last 8760 hours.  ProBNP (last 3 results) No results for input(s): PROBNP in the last 8760 hours.  CBG: No results for input(s): GLUCAP in the last 168 hours.  Radiological Exams on Admission: DG Chest Port 1 View  Result Date: 06/10/2020 CLINICAL DATA:  Choked on dinner.  Possible aspiration. EXAM: PORTABLE CHEST 1 VIEW COMPARISON:  Radiograph 08/22/2019 FINDINGS: Moderate patchy opacities throughout the right lower lung zone. The left lung is clear. The heart is normal in size. Normal mediastinal contours with aortic atherosclerosis. No pneumothorax or pleural effusion. Right lower lateral rib fractures with incomplete callus formation and are either subacute or chronic. No pulmonary edema. Bones are diffusely under mineralized. IMPRESSION: 1. Moderate patchy opacities throughout the right lower lung zone suspicious for pneumonia, including aspiration. 2. Right lower lateral rib fractures with incomplete callus formation, likely subacute. Electronically Signed   By: Keith Rake M.D.   On: 06/10/2020 23:20    EKG: Independently reviewed  Assessment/Plan Present on Admission: . Pneumonia . Controlled type 2 diabetes mellitus with hypoglycemia ([place]) . HTN (hypertension) . Hypernatremia . Hypokalemia . Aspiration into lower respiratory tract, initial encounter  Principal Problem:   Pneumonia Active Problems:   Controlled type 2 diabetes mellitus with hypoglycemia (HCC)   HTN (hypertension)   Generalized weakness   Dementia without behavioral disturbance (HCC)   Hypernatremia    Hypokalemia   Aspiration into lower respiratory tract, initial encounter  #1 aspiration pneumonia.  We will continue him on Zosyn  2.  Hypertension continue home meds  3.  Hypokalemia will replace with potassium and recheck  4.  Hyponatremia we will start him on D5 normal saline  6.  Right eye conjunctivitis started him on neomycin ointment  7.  Dysphagia continue dysphagia diet with nectar thick.  I will obtain speech evaluation with swallow study  8.  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 Inpatient status is judged to be reasonable and necessary in order to provide the required intensity of service to ensure the patient's safety. The patient's presenting symptoms, physical exam findings, and initial radiographic and laboratory data in the context of their chronic comorbidities is felt to place them at high risk for further clinical deterioration. Furthermore, it is not anticipated that the patient will be medically stable for discharge from the hospital within 2 midnights of admission. The following factors support the patient status of inpatient.   " The patient's presenting symptoms include generalized weakness. " The worrisome physical exam findings include rales cachexia. " The initial radiographic and laboratory data are worrisome because of right lower lobe middle lobe pneumonia. " The chronic co-morbidities include dementia.   * I certify that at the point of admission it is my clinical judgment that the patient will require inpatient hospital care spanning beyond 2 midnights from the point of admission due to high intensity of service, high risk for further deterioration and high frequency of surveillance required.*    DVT prophylaxis: Heparin   Code Status: Full code  Family Communication: Daughter-in-law [name] at bedside  Disposition Plan: Back to nursing home  when stable  Consults called: None  Admission status: Inpatient    Cristal Deer MD [facility] Pager [phone]  If 7PM-7AM, please contact night-coverage [email or web address] Password [email or web address]  06/11/2020, 1:21 AM

## 2020-06-11 NOTE — ED Notes (Signed)
Pt sideways in bed, legs over railing, pulling at cords. Pt is not redirectable. Hx of dementia.

## 2020-06-12 DIAGNOSIS — B59 Pneumocystosis: Secondary | ICD-10-CM | POA: Diagnosis not present

## 2020-06-12 MED ORDER — KCL IN DEXTROSE-NACL 20-5-0.45 MEQ/L-%-% IV SOLN
INTRAVENOUS | Status: DC
  Filled 2020-06-12 (×2): qty 1000

## 2020-06-12 NOTE — Progress Notes (Signed)
Pt has a very difficult time taking medications even with meds crushed and with honey thick consistency and sitting upright. Pt swallowed a small bite of medications and then followed it by thickened liquids and immediately started gurgling and swatting at this RN when she was trying to suction mouth. RN suctioned mouth out and pt stopped gurgling and coughing. RN on day shift stated pt did the same thing with his medications earlier. Pt spit up everything that was given. Pt is very irritable and easily agitated. Pt is resting at this time. RN will continue to monitor.   Larey Days, RN

## 2020-06-12 NOTE — Progress Notes (Signed)
PROGRESS NOTE  [name] YHC:[id number] DOB: 28-Jul-1940 DOA: 06/10/2020 PCP: Toma Deiters, NP  HPI/Recap of past 24 hours: Willie [name] is a 80 y.o. male with medical history significant for 80 year old male with history  dementia diabetes, GERD and hypertension, hypothyroidism seizures, prior stroke and remote history of alcohol abuse who presented to the emergency department because he was noted to not be acting quite himself feeling weak.  Yesterday at dinner he had a choking episode.  He was already on mechanical soft diet with nectar thick liquid but he is family think he may have been aspirating slowly or silently.  They deny any fever but he had experienced poor appetite since today.  He denies any fever but his daughter-in-law who is at bedside noted that his right eye which she had was a little redder than the right and she is concerned he may have an infection they did note some discharge.  Daughter-in-law notes that she tries to get out of bed and every time he is admitted to the hospital he pulls out his IV and he is not as mobile as he used to be.  He also requires quite a lot of encouragement to eat  ED Course: On arrival in the ED his oxygenation was 88% on room air and he was placed on 2 L with improvement.  He was also noted to have right lower lobe pneumonia most likely from aspiration and was started on Zosyn in the emergency department.  He had pulled out his IV and most of the Zosyn must have been wasted.  He has a mild leukocytosis.  06/11/20: Patient was seen and examined in the ED.  He is minimally interactive.  Alert, nonverbal, does not answer questions.  Does not appear to be in acute distress however he is severely malnourished.  He was seen by speech therapist, reported severe dysphagia.  Speech therapist discussed with patient's son about his severe risk of aspiration.  His son agrees to continue with pured diet and honey thick liquids which is the most  restrictive, safest diet we can manage.  Speech therapist will continue to follow.    Seen by palliative care team, now a DNR.  Plan is to treat the treatable and discharged to [name] with hospice, family requested hospice of the [place].  06/12/20: Seen and examined.  Somnolent but easily arousable to voices, minimally verbal.  He denies any pain.   Assessment/Plan: Principal Problem:   Pneumonia Active Problems:   Controlled type 2 diabetes mellitus with hypoglycemia (HCC)   HTN (hypertension)   Generalized weakness   Dementia without behavioral disturbance (HCC)   Hypernatremia   Hypokalemia   Aspiration into lower respiratory tract, initial encounter  Aspiration pneumonia, POA Continue Zosyn, day #2 Continue aspiration precautions  Acute hypoxic respiratory failure secondary to aspiration pneumonia Not on oxygen supplementation at baseline Currently requiring 5 L to maintain O2 saturation greater than 92% Continue to monitor and wean off oxygen supplementation as tolerated  Severe dysphagia Seen by speech therapist Currently on pleasure feedings Per family today treatable and then discharged to [name] with hospice. Continue aspiration precautions as able  Hypothyroidism Resume home levothyroxine  Essential hypertension BP is currently at goal Not on any antihypertensives  Hypovolemic hyponatremia in the setting of poor oral intake Serum sodium 153 on 06/11/2020 Started IV fluid hydration D5 half-normal saline with KCl Repeat BMP  Hypokalemia Serum potassium 3.1 Repleted intravenously Obtain magnesium level. Continue to monitor.  Physical debility Continue fall precautions  Code Status: DNR  Family Communication: None at bedside  Disposition Plan: Likely DC to SNF facility with hospice on 06/14/2020   Consultants:  Palliative care team  Speech therapist  Procedures:  None  Antimicrobials:  Zosyn  DVT  prophylaxis: Heparin subcu 3 times daily  Status is: Inpatient    Dispo:  Patient From: [place]  Planned Disposition: [place]  Expected discharge date: 06/14/2020  Medically stable for discharge: No, ongoing management of aspiration pneumonia and acute hypoxic respiratory failure.         Objective: Vitals:   06/11/20 2215 06/12/20 0613 06/12/20 1034 06/12/20 1457  BP: (!) 109/58 (!) 131/58 (!) 158/63 (!) 115/55  Pulse: 73 67 91 (!) 57  Resp: 20 19  16   Temp: 98.2 F (36.8 C) 98.3 F (36.8 C)  (!) 97.5 F (36.4 C)  TempSrc:  Axillary  Axillary  SpO2: 98% 99% 97% 96%  Weight:      Height:        Intake/Output Summary (Last 24 hours) at 06/12/2020 1535 Last data filed at 06/12/2020 0802 Gross per 24 hour  Intake 212.63 ml  Output --  Net 212.63 ml   Filed Weights   06/11/20 1746  Weight: 53.5 kg    Exam:  . General: 80 y.o. year-old male severely malnourished.  Somnolent but easily arousable to voices.  Minimally verbal.   . Cardiovascular: Regular rate and rhythm no rubs or gallops.  Marland Kitchen Respiratory: Virucide bases no wheezing noted.  Poor inspiratory effort. . Abdomen: Some of the normal bowel sounds present.  . Musculoskeletal: No extremity edema bilaterally.   Marland Kitchen Psychiatry: Unable to assess mood due to somnolence.   Data Reviewed: CBC: Recent Labs  Lab 06/10/20 2247 06/11/20 0342  WBC 10.7* 11.4*  NEUTROABS 8.5*  --   HGB 10.4* 10.4*  HCT 33.2* 35.5*  MCV 97.4 100.3*  PLT 307 [id number]   Basic Metabolic Panel: Recent Labs  Lab 06/10/20 2247 06/11/20 0342  NA 148* 153*  K 3.2* 3.4*  CL 107 109  CO2 30 31  GLUCOSE 136* 99  BUN 40* 37*  CREATININE 1.10 1.10  CALCIUM 8.7* 8.9   GFR: Estimated Creatinine Clearance: 39.9 mL/min (by C-G formula based on SCr of 1.1 mg/dL). Liver Function Tests: Recent Labs  Lab 06/10/20 2247  AST 10*  ALT 9  ALKPHOS 72  BILITOT 0.5  PROT 6.3*  ALBUMIN 2.1*   No results for  input(s): LIPASE, AMYLASE in the last 168 hours. No results for input(s): AMMONIA in the last 168 hours. Coagulation Profile: No results for input(s): INR, PROTIME in the last 168 hours. Cardiac Enzymes: No results for input(s): CKTOTAL, CKMB, CKMBINDEX, TROPONINI in the last 168 hours. BNP (last 3 results) No results for input(s): PROBNP in the last 8760 hours. HbA1C: No results for input(s): HGBA1C in the last 72 hours. CBG: Recent Labs  Lab 06/11/20 1728  GLUCAP 130*   Lipid Profile: No results for input(s): CHOL, HDL, LDLCALC, TRIG, CHOLHDL, LDLDIRECT in the last 72 hours. Thyroid Function Tests: No results for input(s): TSH, T4TOTAL, FREET4, T3FREE, THYROIDAB in the last 72 hours. Anemia Panel: No results for input(s): VITAMINB12, FOLATE, FERRITIN, TIBC, IRON, RETICCTPCT in the last 72 hours. Urine analysis:    Component Value Date/Time   COLORURINE YELLOW 08/22/2019 1102   APPEARANCEUR CLEAR 08/22/2019 1102   LABSPEC 1.014 08/22/2019 1102   PHURINE 8.0 08/22/2019 1102   GLUCOSEU 50 (  A) 08/22/2019 1102   HGBUR NEGATIVE 08/22/2019 1102   BILIRUBINUR NEGATIVE 08/22/2019 1102   KETONESUR 20 (A) 08/22/2019 1102   PROTEINUR NEGATIVE 08/22/2019 1102   UROBILINOGEN 0.2 06/06/2011 1803   NITRITE NEGATIVE 08/22/2019 1102   LEUKOCYTESUR NEGATIVE 08/22/2019 1102   Sepsis Labs: @LABRCNTIP (procalcitonin:4,lacticidven:4)  ) Recent Results (from the past 240 hour(s))  Resp Panel by RT-PCR (Flu A&B, Covid) Nasopharyngeal Swab     Status: None   Collection Time: 06/10/20 11:39 PM   Specimen: Nasopharyngeal Swab; Nasopharyngeal(NP) swabs in vial transport medium  Result Value Ref Range Status   SARS Coronavirus 2 by RT PCR NEGATIVE NEGATIVE Final    Comment: (NOTE) SARS-CoV-2 target nucleic acids are NOT DETECTED.  The SARS-CoV-2 RNA is generally detectable in upper respiratory specimens during the acute phase of infection. The lowest concentration of SARS-CoV-2 viral copies  this assay can detect is 138 copies/mL. A negative result does not preclude SARS-Cov-2 infection and should not be used as the sole basis for treatment or other patient management decisions. A negative result may occur with  improper specimen collection/handling, submission of specimen other than nasopharyngeal swab, presence of viral mutation(s) within the areas targeted by this assay, and inadequate number of viral copies(<138 copies/mL). A negative result must be combined with clinical observations, patient history, and epidemiological information. The expected result is Negative.  Fact Sheet for Patients:  EntrepreneurPulse.com.au  Fact Sheet for Healthcare Providers:  IncredibleEmployment.be  This test is no t yet approved or cleared by the [place] FDA and  has been authorized for detection and/or diagnosis of SARS-CoV-2 by FDA under an Emergency Use Authorization (EUA). This EUA will remain  in effect (meaning this test can be used) for the duration of the COVID-19 declaration under Section 564(b)(1) of the Act, 21 U.S.C.section 360bbb-3(b)(1), unless the authorization is terminated  or revoked sooner.       Influenza A by PCR NEGATIVE NEGATIVE Final   Influenza B by PCR NEGATIVE NEGATIVE Final    Comment: (NOTE) The Xpert Xpress SARS-CoV-2/FLU/RSV plus assay is intended as an aid in the diagnosis of influenza from Nasopharyngeal swab specimens and should not be used as a sole basis for treatment. Nasal washings and aspirates are unacceptable for Xpert Xpress SARS-CoV-2/FLU/RSV testing.  Fact Sheet for Patients: EntrepreneurPulse.com.au  Fact Sheet for Healthcare Providers: IncredibleEmployment.be  This test is not yet approved or cleared by the [place] FDA and has been authorized for detection and/or diagnosis of SARS-CoV-2 by FDA under an Emergency Use Authorization (EUA). This EUA  will remain in effect (meaning this test can be used) for the duration of the COVID-19 declaration under Section 564(b)(1) of the Act, 21 U.S.C. section 360bbb-3(b)(1), unless the authorization is terminated or revoked.  Performed at [facility], [place] [address], [place], [place] [address]   MRSA PCR Screening     Status: None   Collection Time: 06/11/20  5:53 PM   Specimen: Nasopharyngeal  Result Value Ref Range Status   MRSA by PCR NEGATIVE NEGATIVE Final    Comment:        The GeneXpert MRSA Assay (FDA approved for NASAL specimens only), is one component of a comprehensive MRSA colonization surveillance program. It is not intended to diagnose MRSA infection nor to guide or monitor treatment for MRSA infections. Performed at [facility], [place] [address], [place], [place] [address]       Studies: No results found.  Scheduled Meds: . acetaminophen  325  mg Oral Daily  . atorvastatin  10 mg Oral QPM  . bacitracin-polymyxin b   Right Eye BID  . baclofen  10 mg Oral TID  . divalproex  500 mg Oral BID  . memantine  28 mg Oral Daily   And  . donepezil  10 mg Oral Daily  . escitalopram  30 mg Oral Daily  . heparin  5,000 Units Subcutaneous Q8H  . levETIRAcetam  500 mg Oral BID  . levothyroxine  150 mcg Oral QAC breakfast  . mirabegron ER  50 mg Oral Daily  . neomycin-bacitracin-polymyxin  1 application Topical Daily  . pantoprazole  40 mg Oral QHS  . polyvinyl alcohol  1 drop Both Eyes Q4H while awake  . sodium chloride flush  3 mL Intravenous Q12H  . traZODone  100 mg Oral QHS    Continuous Infusions: . sodium chloride    . dextrose 5 % and 0.45 % NaCl with KCl 20 mEq/L 75 mL/hr at 06/12/20 0849  . piperacillin-tazobactam (ZOSYN)  IV 3.375 g (06/12/20 1405)     LOS: 1 day     Kayleen Memos, MD [facility] Pager [phone]  If 7PM-7AM, please contact night-coverage [email or web address] Password [email or web address] 06/12/2020, 3:35 PM

## 2020-06-12 NOTE — Plan of Care (Signed)
  Problem: Activity: Goal: Risk for activity intolerance will decrease Outcome: Progressing   Problem: Nutrition: Goal: Adequate nutrition will be maintained Outcome: Progressing   Problem: Safety: Goal: Ability to remain free from injury will improve Outcome: Progressing   

## 2020-06-13 DIAGNOSIS — B59 Pneumocystosis: Secondary | ICD-10-CM | POA: Diagnosis not present

## 2020-06-13 LAB — CBC WITH DIFFERENTIAL/PLATELET
Abs Immature Granulocytes: 0.09 10*3/uL — ABNORMAL HIGH (ref 0.00–0.07)
Basophils Absolute: 0 10*3/uL (ref 0.0–0.1)
Basophils Relative: 0 %
Eosinophils Absolute: 0 10*3/uL (ref 0.0–0.5)
Eosinophils Relative: 0 %
HCT: 32.9 % — ABNORMAL LOW (ref 39.0–52.0)
Hemoglobin: 9.9 g/dL — ABNORMAL LOW (ref 13.0–17.0)
Immature Granulocytes: 1 %
Lymphocytes Relative: 10 %
Lymphs Abs: 1.9 10*3/uL (ref 0.7–4.0)
MCH: 29.5 pg (ref 26.0–34.0)
MCHC: 30.1 g/dL (ref 30.0–36.0)
MCV: 97.9 fL (ref 80.0–100.0)
Monocytes Absolute: 0.4 10*3/uL (ref 0.1–1.0)
Monocytes Relative: 2 %
Neutro Abs: 16.4 10*3/uL — ABNORMAL HIGH (ref 1.7–7.7)
Neutrophils Relative %: 87 %
Platelets: 255 10*3/uL (ref 150–400)
RBC: 3.36 MIL/uL — ABNORMAL LOW (ref 4.22–5.81)
RDW: 15.1 % (ref 11.5–15.5)
WBC: 18.7 10*3/uL — ABNORMAL HIGH (ref 4.0–10.5)
nRBC: 0 % (ref 0.0–0.2)

## 2020-06-13 LAB — BASIC METABOLIC PANEL
Anion gap: 9 (ref 5–15)
BUN: 30 mg/dL — ABNORMAL HIGH (ref 8–23)
CO2: 33 mmol/L — ABNORMAL HIGH (ref 22–32)
Calcium: 8.6 mg/dL — ABNORMAL LOW (ref 8.9–10.3)
Chloride: 111 mmol/L (ref 98–111)
Creatinine, Ser: 1.11 mg/dL (ref 0.61–1.24)
GFR, Estimated: >60 mL/min (ref >60)
Glucose, Bld: 136 mg/dL — ABNORMAL HIGH (ref 70–99)
Potassium: 4.2 mmol/L (ref 3.5–5.1)
Sodium: 153 mmol/L — ABNORMAL HIGH (ref 135–145)

## 2020-06-13 LAB — SODIUM: Sodium: 151 mmol/L — ABNORMAL HIGH (ref 135–145)

## 2020-06-13 MED ORDER — DEXTROSE-NACL 5-0.45 % IV SOLN: INTRAVENOUS | Status: AC

## 2020-06-13 NOTE — Plan of Care (Signed)
  Problem: Nutrition: Goal: Adequate nutrition will be maintained Outcome: Progressing   Problem: Safety: Goal: Ability to remain free from injury will improve Outcome: Progressing   

## 2020-06-13 NOTE — Progress Notes (Signed)
PROGRESS NOTE  Willie [name] [id number] DOB: March 09, 1941 DOA: 06/10/2020 PCP: Harvel Quale, NP  HPI/Recap of past 24 hours: Willie [name] is a 80 y.o. male with medical history significant for dementia, GERD, hypertension, hypothyroidism, seizure disorder, prior stroke and remote history of alcohol abuse who presented to the emergency department because he was noted to not be acting quite himself associated with generalized weakness and a choking event at SNF.    ED Course: On arrival in the ED his oxygenation was 88% on room air and he was placed on 2 L with improvement.  He was also noted to have right lower lobe pneumonia most likely from aspiration and was started on Zosyn in the emergency department.  He has a mild leukocytosis.  He was seen by speech therapist, reported severe dysphagia.  Speech therapist discussed with patient's son about his severe risk of aspiration.  His son agrees to continue with pured diet and honey thick liquids which is the most restrictive, safest diet we can manage.  Speech therapist will continue to follow.    Seen by palliative care team, now a DNR.  Plan is to treat the treatable and discharge to [facility] with hospice, family requested hospice of the [place].  06/13/20: Patient was seen and examined at his bedside.  He is alert and pleasant this morning.  His food is at bedside and he is requiring feeding assistance.  He has no new complaints.  He is currently on Zosyn for aspiration pneumonia.   Assessment/Plan: Principal Problem:   Pneumonia Active Problems:   Controlled type 2 diabetes mellitus with hypoglycemia (HCC)   HTN (hypertension)   Generalized weakness   Dementia without behavioral disturbance (HCC)   Hypernatremia   Hypokalemia   Aspiration into lower respiratory tract, initial encounter  Aspiration pneumonia, POA Continue Zosyn, day #3/5 Continue aspiration precautions  Persistent hypovolemic hypernatremia in the  setting of poor oral intake Serum sodium 153 on 06/11/2020 Repeated serum sodium 153 on 06/13/2020 Started D5 half-normal saline at 100 cc/h Closely monitor serum sodium, careful not to correct too quickly. Repeat serum sodium this afternoon. BMP in the morning  Acute hypoxic respiratory failure secondary to aspiration pneumonia Not on oxygen supplementation at baseline Currently requiring 5 L to maintain O2 saturation greater than 92% Continue to monitor and wean off oxygen supplementation as tolerated  Severe dysphagia with concern for silent aspiration Seen by speech therapist Continue pleasure feedings Per family today treatable and then [facility] to [facility] with hosp[facility]. Continue aspiration precautions as able  Hypothyroidism Continue home levothyroxine  Essential hypertension BP stable Not on any antihypertensives Continue to monitor vital signs  Resolved post repletion: Hypokalemia Serum potassium 3.1>.  4.2. Repleted intravenously Obtain magnesium level. Repeat BMP in the morning  Physical debility Continue fall precautions  Severe protein calorie malnutrition BMI 16 Severe muscle wasting Oral supplement as able  Code Status: DNR  Family Communication: None at bedside  Disposition Plan: Likely DC to SNF facility with hospice on 06/14/2020   Consultants:  Palliative care team  Speech therapist  Procedures:  None  Antimicrobials:  Zosyn  DVT prophylaxis: Heparin subcu 3 times daily  Status is: Inpatient    Dispo:  Patient From: [place]  Planned Disposition: [place]  Expected discharge date: 06/14/2020  Medically stable for discharge: No, ongoing management of aspiration pneumonia and acute hypoxic respiratory failure.         Objective: Vitals:   06/12/20  1756 06/12/20 2114 06/13/20 0533 06/13/20 0907  BP: (!) 120/51 134/60 (!) 149/66 (!) 152/54  Pulse: 69 64 62 65  Resp: 14 15 16 18    Temp: 98 F (36.7 C) 98.4 F (36.9 C) 98.3 F (36.8 C) 98.2 F (36.8 C)  TempSrc: Oral     SpO2: 91% 99% 98% 100%  Weight:  53.5 kg    Height:        Intake/Output Summary (Last 24 hours) at 06/13/2020 1611 Last data filed at 06/13/2020 1558 Gross per 24 hour  Intake 1666.39 ml  Output 1075 ml  Net 591.39 ml   Filed Weights   06/11/20 1746 06/12/20 2114  Weight: 53.5 kg 53.5 kg    Exam:  . General: 80 y.o. year-old male severely malnourished.  Alert and pleasant.   . Cardiovascular: Regular rate and rhythm no rubs or gallops. Marland Kitchen Respiratory: Mild rales at bases, worse on the right.  No wheezing noted.  Poor inspiratory effort.   . Abdomen: Soft nontender normal bowel sounds. . Musculoskeletal: No lower extremity edema bilaterally . Psychiatry: Mood is appropriate for condition and setting.   Data Reviewed: CBC: Recent Labs  Lab 06/10/20 2247 06/11/20 0342 06/13/20 0439  WBC 10.7* 11.4* 18.7*  NEUTROABS 8.5*  --  16.4*  HGB 10.4* 10.4* 9.9*  HCT 33.2* 35.5* 32.9*  MCV 97.4 100.3* 97.9  PLT 307 314 [id number]   Basic Metabolic Panel: Recent Labs  Lab 06/10/20 2247 06/11/20 0342 06/13/20 0439  NA 148* 153* 153*  K 3.2* 3.4* 4.2  CL 107 109 111  CO2 30 31 33*  GLUCOSE 136* 99 136*  BUN 40* 37* 30*  CREATININE 1.10 1.10 1.11  CALCIUM 8.7* 8.9 8.6*   GFR: Estimated Creatinine Clearance: 39.5 mL/min (by C-G formula based on SCr of 1.11 mg/dL). Liver Function Tests: Recent Labs  Lab 06/10/20 2247  AST 10*  ALT 9  ALKPHOS 72  BILITOT 0.5  PROT 6.3*  ALBUMIN 2.1*   No results for input(s): LIPASE, AMYLASE in the last 168 hours. No results for input(s): AMMONIA in the last 168 hours. Coagulation Profile: No results for input(s): INR, PROTIME in the last 168 hours. Cardiac Enzymes: No results for input(s): CKTOTAL, CKMB, CKMBINDEX, TROPONINI in the last 168 hours. BNP (last 3 results) No results for input(s): PROBNP in the last 8760 hours. HbA1C: No  results for input(s): HGBA1C in the last 72 hours. CBG: Recent Labs  Lab 06/11/20 1728  GLUCAP 130*   Lipid Profile: No results for input(s): CHOL, HDL, LDLCALC, TRIG, CHOLHDL, LDLDIRECT in the last 72 hours. Thyroid Function Tests: No results for input(s): TSH, T4TOTAL, FREET4, T3FREE, THYROIDAB in the last 72 hours. Anemia Panel: No results for input(s): VITAMINB12, FOLATE, FERRITIN, TIBC, IRON, RETICCTPCT in the last 72 hours. Urine analysis:    Component Value Date/Time   COLORURINE YELLOW 08/22/2019 1102   APPEARANCEUR CLEAR 08/22/2019 1102   LABSPEC 1.014 08/22/2019 1102   PHURINE 8.0 08/22/2019 1102   GLUCOSEU 50 (A) 08/22/2019 1102   HGBUR NEGATIVE 08/22/2019 1102   BILIRUBINUR NEGATIVE 08/22/2019 1102   KETONESUR 20 (A) 08/22/2019 1102   PROTEINUR NEGATIVE 08/22/2019 1102   UROBILINOGEN 0.2 06/06/2011 1803   NITRITE NEGATIVE 08/22/2019 1102   LEUKOCYTESUR NEGATIVE 08/22/2019 1102   Sepsis Labs: @LABRCNTIP (procalcitonin:4,lacticidven:4)  ) Recent Results (from the past 240 hour(s))  Resp Panel by RT-PCR (Flu A&B, Covid) Nasopharyngeal Swab     Status: None   Collection Time: 06/10/20 11:39 PM  Specimen: Nasopharyngeal Swab; Nasopharyngeal(NP) swabs in vial transport medium  Result Value Ref Range Status   SARS Coronavirus 2 by RT PCR NEGATIVE NEGATIVE Final    Comment: (NOTE) SARS-CoV-2 target nucleic acids are NOT DETECTED.  The SARS-CoV-2 RNA is generally detectable in upper respiratory specimens during the acute phase of infection. The lowest concentration of SARS-CoV-2 viral copies this assay can detect is 138 copies/mL. A negative result does not preclude SARS-Cov-2 infection and should not be used as the sole basis for treatment or other patient management decisions. A negative result may occur with  improper specimen collection/handling, submission of specimen other than nasopharyngeal swab, presence of viral mutation(s) within the areas targeted by  this assay, and inadequate number of viral copies(<138 copies/mL). A negative result must be combined with clinical observations, patient history, and epidemiological information. The expected result is Negative.  Fact Sheet for Patients:  BloggerCourse.com  Fact Sheet for Healthcare Providers:  SeriousBroker.it  This test is no t yet approved or cleared by the [place] FDA and  has been authorized for detection and/or diagnosis of SARS-CoV-2 by FDA under an Emergency Use Authorization (EUA). This EUA will remain  in effect (meaning this test can be used) for the duration of the COVID-19 declaration under Section 564(b)(1) of the Act, 21 U.S.C.section 360bbb-3(b)(1), unless the authorization is terminated  or revoked sooner.       Influenza A by PCR NEGATIVE NEGATIVE Final   Influenza B by PCR NEGATIVE NEGATIVE Final    Comment: (NOTE) The Xpert Xpress SARS-CoV-2/FLU/RSV plus assay is intended as an aid in the diagnosis of influenza from Nasopharyngeal swab specimens and should not be used as a sole basis for treatment. Nasal washings and aspirates are unacceptable for Xpert Xpress SARS-CoV-2/FLU/RSV testing.  Fact Sheet for Patients: BloggerCourse.com  Fact Sheet for Healthcare Providers: SeriousBroker.it  This test is not yet approved or cleared by the [place] FDA and has been authorized for detection and/or diagnosis of SARS-CoV-2 by FDA under an Emergency Use Authorization (EUA). This EUA will remain in effect (meaning this test can be used) for the duration of the COVID-19 declaration under Section 564(b)(1) of the Act, 21 U.S.C. section 360bbb-3(b)(1), unless the authorization is terminated or revoked.  Performed at [facility], [address], [place], [place] [address]   MRSA PCR Screening     Status: None   Collection Time: 06/11/20  5:53 PM    Specimen: Nasopharyngeal  Result Value Ref Range Status   MRSA by PCR NEGATIVE NEGATIVE Final    Comment:        The GeneXpert MRSA Assay (FDA approved for NASAL specimens only), is one component of a comprehensive MRSA colonization surveillance program. It is not intended to diagnose MRSA infection nor to guide or monitor treatment for MRSA infections. Performed at [facility], [address], [place], [place] [address]       Studies: No results found.  Scheduled Meds: . acetaminophen  325 mg Oral Daily  . atorvastatin  10 mg Oral QPM  . bacitracin-polymyxin b   Right Eye BID  . baclofen  10 mg Oral TID  . divalproex  500 mg Oral BID  . memantine  28 mg Oral Daily   And  . donepezil  10 mg Oral Daily  . escitalopram  30 mg Oral Daily  . heparin  5,000 Units Subcutaneous Q8H  . levETIRAcetam  500 mg Oral BID  . levothyroxine  150 mcg  Oral QAC breakfast  . mirabegron ER  50 mg Oral Daily  . neomycin-bacitracin-polymyxin  1 application Topical Daily  . pantoprazole  40 mg Oral QHS  . polyvinyl alcohol  1 drop Both Eyes Q4H while awake  . sodium chloride flush  3 mL Intravenous Q12H  . traZODone  100 mg Oral QHS    Continuous Infusions: . sodium chloride    . dextrose 5 % and 0.45% NaCl Stopped (06/13/20 1225)  . piperacillin-tazobactam (ZOSYN)  IV 3.375 g (06/13/20 [id number])     LOS: 2 days     Kayleen Memos, MD [facility] Pager [phone]  If 7PM-7AM, please contact night-coverage [email or web address] Password [email or web address] 06/13/2020, 4:11 PM

## 2020-06-14 ENCOUNTER — Ambulatory Visit: Payer: Medicare Other | Admitting: Neurology

## 2020-06-14 ENCOUNTER — Inpatient Hospital Stay (HOSPITAL_COMMUNITY): Payer: Medicare Other

## 2020-06-14 DIAGNOSIS — B59 Pneumocystosis: Secondary | ICD-10-CM | POA: Diagnosis not present

## 2020-06-14 LAB — BASIC METABOLIC PANEL
Anion gap: 12 (ref 5–15)
BUN: 29 mg/dL — ABNORMAL HIGH (ref 8–23)
CO2: 30 mmol/L (ref 22–32)
Calcium: 8.8 mg/dL — ABNORMAL LOW (ref 8.9–10.3)
Chloride: 109 mmol/L (ref 98–111)
Creatinine, Ser: 1.01 mg/dL (ref 0.61–1.24)
GFR, Estimated: >60 mL/min (ref >60)
Glucose, Bld: 144 mg/dL — ABNORMAL HIGH (ref 70–99)
Potassium: 3.3 mmol/L — ABNORMAL LOW (ref 3.5–5.1)
Sodium: 151 mmol/L — ABNORMAL HIGH (ref 135–145)

## 2020-06-14 LAB — CBC WITH DIFFERENTIAL/PLATELET
Abs Immature Granulocytes: 0.07 10*3/uL (ref 0.00–0.07)
Basophils Absolute: 0 10*3/uL (ref 0.0–0.1)
Basophils Relative: 0 %
Eosinophils Absolute: 0 10*3/uL (ref 0.0–0.5)
Eosinophils Relative: 0 %
HCT: 29.8 % — ABNORMAL LOW (ref 39.0–52.0)
Hemoglobin: 9.5 g/dL — ABNORMAL LOW (ref 13.0–17.0)
Immature Granulocytes: 0 %
Lymphocytes Relative: 10 %
Lymphs Abs: 1.6 10*3/uL (ref 0.7–4.0)
MCH: 30.7 pg (ref 26.0–34.0)
MCHC: 31.9 g/dL (ref 30.0–36.0)
MCV: 96.4 fL (ref 80.0–100.0)
Monocytes Absolute: 0.3 10*3/uL (ref 0.1–1.0)
Monocytes Relative: 2 %
Neutro Abs: 13.7 10*3/uL — ABNORMAL HIGH (ref 1.7–7.7)
Neutrophils Relative %: 88 %
Platelets: 253 10*3/uL (ref 150–400)
RBC: 3.09 MIL/uL — ABNORMAL LOW (ref 4.22–5.81)
RDW: 14.7 % (ref 11.5–15.5)
WBC: 15.6 10*3/uL — ABNORMAL HIGH (ref 4.0–10.5)
nRBC: 0 % (ref 0.0–0.2)

## 2020-06-14 LAB — PROCALCITONIN: Procalcitonin: 0.52 ng/mL

## 2020-06-14 MED ORDER — SACCHAROMYCES BOULARDII 250 MG PO CAPS
250.0000 mg | ORAL_CAPSULE | Freq: Two times a day (BID) | ORAL | Status: DC
  Administered 2020-06-14 – 2020-06-15 (×3): 250 mg via ORAL
  Filled 2020-06-14 (×3): qty 1

## 2020-06-14 MED ORDER — POTASSIUM CHLORIDE CRYS ER 20 MEQ PO TBCR
40.0000 meq | EXTENDED_RELEASE_TABLET | Freq: Once | ORAL | Status: AC
  Administered 2020-06-14: 40 meq via ORAL
  Filled 2020-06-14: qty 2

## 2020-06-14 NOTE — Progress Notes (Signed)
  Speech Language Pathology Treatment: Dysphagia  Patient Details Name: Willie [name] MRN: [id number] DOB: [birth year] Today's Date: 06/14/2020 Time: 1330-1430 SLP Time Calculation (min) (ACUTE ONLY): 60 min  Assessment / Plan / Recommendation Clinical Impression  Pt was seen at bedside for follow up after BSE completed 06/11/20. Pt's daughter in law was present during this session. Pt was observed with puree textures and honey thick liquids via straw. Family reports he has been using a straw since his CVA, due to right orofacial weakness and anterior leakage. Pt exhibits a consistent cough following boluses of honey thick liquid, but appears to tolerate puree consistencies, demonstrating no overt s/s aspiration with purees. Liquids could be thickened to a pudding consistency, however, liquids of that thickness are even more unlikely to meet PO needs. Extensive education was completed with daughter in law regarding the importance of thorough oral care before PO intake, to minimize bacterial load. Oral care was completed with suction, and family was encouraged to use suction to remove material pt coughs up. Family was provided with written information regarding "continuing PO intake with known risks of aspiration". Safe swallow precautions were posted at [facility] and reviewed with family present. Will continue puree diet and honey thick liquids at this time. RN was informed of education provided. SLP will follow up once more and will likely sign off unless other needs are identified.    HPI HPI: 80yo male admitted 06/10/20 with AMS, weakness. Choking episode 06/09/20. PMH: dementia diabetes, GERD, HTN, hypothyroidism, seizures, prior stroke and remote history of alcohol abuse. He was already on mechanical soft diet with nectar thick liquid but his family thinks he may have been aspirating slowly or silently. Pt has been on nectar thick liquids since march of 2021 after clnical assessment showed frequent throat  clearing with thin. No MBS due to mentation. Currently CXR shows moderate patchy opacities throughout the right lower lung zone suspicious for pneumonia, including aspiration. BSE completed 06/11/20      SLP Plan  Continue with current plan of care       Recommendations  Diet recommendations: Dysphagia 1 (puree);Honey-thick liquid Liquids provided via: Straw Medication Administration: Crushed with puree Supervision: Full supervision/cueing for compensatory strategies;Trained caregiver to feed patient Compensations: Slow rate;Small sips/bites;Minimize environmental distractions Postural Changes and/or Swallow Maneuvers: Seated upright 90 degrees;Upright 30-60 min after meal                Oral Care Recommendations: Oral care before and after PO SLP Visit Diagnosis: Dysphagia, oropharyngeal phase (R13.12) Plan: Continue with current plan of care       GO               Jong Rickman B. Murvin Natal, [facility], CCC-SLP Speech Language Pathologist Office: [phone] Pager: [phone]  Leigh Aurora 06/14/2020, 2:38 PM

## 2020-06-14 NOTE — Progress Notes (Signed)
PROGRESS NOTE  [name] [id number] DOB: 1941/02/16 DOA: 06/10/2020 PCP: Harvel Quale, NP  HPI/Recap of past 24 hours: Willie [name] is a 80 y.o. male with medical history significant for dementia, GERD, hypertension, hypothyroidism, seizure disorder, prior stroke and remote history of alcohol abuse who presented to the emergency department because he was noted to not be acting quite himself associated with generalized weakness and a choking event at SNF.    ED Course: On arrival in the ED his oxygenation was 88% on room air and he was placed on 2 L with improvement.  He was also noted to have right lower lobe pneumonia most likely from aspiration and was started on Zosyn in the emergency department.  He has a mild leukocytosis.  He was seen by speech therapist, reported severe dysphagia.  Speech therapist discussed with patient's son about his severe risk of aspiration.  His son agrees to continue with pured diet and honey thick liquids which is the most restrictive, safest diet we can manage.  Speech therapist will continue to follow.    Seen by palliative care team, now a DNR.  Plan is to treat the treatable and discharge to [name] with hospice, family requested [facility] of the [place].  06/14/20: Patient was seen and examined at his bedside.  Audible gurgling sounds noted at bedside.  Discussed the high risks of aspiration with his daughter-in-law.  She is leaning towards discharge directly to hospice house rather than to his assisted living facility/memory care with hospice.  Assessment/Plan: Principal Problem:   Pneumonia Active Problems:   Controlled type 2 diabetes mellitus with hypoglycemia (HCC)   HTN (hypertension)   Generalized weakness   Dementia without behavioral disturbance (HCC)   Hypernatremia   Hypokalemia   Aspiration into lower respiratory tract, initial encounter  Aspiration pneumonia, POA Continue Zosyn, day #4/5 Continue aspiration  precautions Procalcitonin 0.58 Repeat procalcitonin in the morning  Persistent hypovolemic hypernatremia in the setting of poor oral intake Serum sodium 153 on 06/11/2020 Repeated serum sodium 153 on 06/13/2020 Started D5 half-normal saline at 100 cc/h Closely monitor serum sodium, careful not to correct too quickly. Repeat serum sodium this afternoon. BMP in the morning  Acute hypoxic respiratory failure secondary to aspiration pneumonia Not on oxygen supplementation at baseline Currently requiring 5 L to maintain O2 saturation greater than 92% Continue to monitor and wean off oxygen supplementation as tolerated  Severe dysphagia with concern for recurrent and silent aspiration Seen by speech therapist He is currently on pleasure feedings per family wishes Continue aspiration precautions as able Discussed the high risks of aspiration with his daughter-in-law.  She is leaning towards discharge directly to hospice house rather than to his assisted living facility/memory care with hospice. Repeat chest x-ray today  Hypothyroidism Continue home levothyroxine  Essential hypertension BP stable Not on any antihypertensives Continue to monitor vital signs  Resolved post repletion: Hypokalemia Serum potassium 3.1>.  4.2. Repleted intravenously Obtain magnesium level. Repeat BMP in the morning  Physical debility Continue fall precautions  Severe protein calorie malnutrition BMI 16 Severe muscle wasting Continue oral supplement as able He is requiring feeding assistance.  Goals of care Palliative care team following DNR Very poor prognosis due to severe dysphagia, recurrent aspiration with concern for silent aspiration, acute hypoxic respiratory failure, severe protein calorie malnutrition, poor functional status.   Code Status: DNR  Family Communication: None at bedside  Disposition Plan: Likely DC to SNF facility with hospice on 06/14/2020  Consultants:  Palliative  care team  Speech therapist  Procedures:  None  Antimicrobials:  Zosyn  DVT prophylaxis: Heparin subcu 3 times daily  Status is: Inpatient    Dispo:  Patient From: [facility]  Planned Disposition: [facility]  Expected discharge date: 06/15/2020  Medically stable for discharge: No, ongoing management of aspiration pneumonia and acute hypoxic respiratory failure.         Objective: Vitals:   06/13/20 1730 06/13/20 2121 06/14/20 0525 06/14/20 0913  BP: 135/63 (!) 156/70 (!) 167/66 (!) 157/68  Pulse: 72 67 62 66  Resp: 18 15 14 16   Temp: 98.6 F (37 C) 98.4 F (36.9 C) 98.3 F (36.8 C) 98.2 F (36.8 C)  TempSrc: Oral   Oral  SpO2: 97% 97% 98% 98%  Weight:      Height:        Intake/Output Summary (Last 24 hours) at 06/14/2020 1436 Last data filed at 06/14/2020 1300 Gross per 24 hour  Intake 438.39 ml  Output 50 ml  Net 388.39 ml   Filed Weights   06/11/20 1746 06/12/20 2114  Weight: 53.5 kg 53.5 kg    Exam:  . General: 79 y.o. year-old male severely nauseated.  Alert and pleasantly confused.   . Cardiovascular: Regular rate and rhythm no rubs or gallops.  Marland Kitchen Respiratory: Audible gurgling sound.  Diffuse rales bilaterally.  Poor inspiratory effort.   . Abdomen: Soft nontender normal bowel sounds present . Musculoskeletal: No lower extremity edema bilaterally. Marland Kitchen Psychiatry: Mood is appropriate for condition and setting.   Data Reviewed: CBC: Recent Labs  Lab 06/10/20 2247 06/11/20 0342 06/13/20 0439 06/14/20 0235  WBC 10.7* 11.4* 18.7* 15.6*  NEUTROABS 8.5*  --  16.4* 13.7*  HGB 10.4* 10.4* 9.9* 9.5*  HCT 33.2* 35.5* 32.9* 29.8*  MCV 97.4 100.3* 97.9 96.4  PLT 307 314 255 [id number]   Basic Metabolic Panel: Recent Labs  Lab 06/10/20 2247 06/11/20 0342 06/13/20 0439 06/13/20 1810 06/14/20 0235  NA 148* 153* 153* 151* 151*  K 3.2* 3.4* 4.2  --  3.3*  CL 107 109 111  --  109  CO2 30 31 33*  --  30  GLUCOSE 136* 99  136*  --  144*  BUN 40* 37* 30*  --  29*  CREATININE 1.10 1.10 1.11  --  1.01  CALCIUM 8.7* 8.9 8.6*  --  8.8*   GFR: Estimated Creatinine Clearance: 43.4 mL/min (by C-G formula based on SCr of 1.01 mg/dL). Liver Function Tests: Recent Labs  Lab 06/10/20 2247  AST 10*  ALT 9  ALKPHOS 72  BILITOT 0.5  PROT 6.3*  ALBUMIN 2.1*   No results for input(s): LIPASE, AMYLASE in the last 168 hours. No results for input(s): AMMONIA in the last 168 hours. Coagulation Profile: No results for input(s): INR, PROTIME in the last 168 hours. Cardiac Enzymes: No results for input(s): CKTOTAL, CKMB, CKMBINDEX, TROPONINI in the last 168 hours. BNP (last 3 results) No results for input(s): PROBNP in the last 8760 hours. HbA1C: No results for input(s): HGBA1C in the last 72 hours. CBG: Recent Labs  Lab 06/11/20 1728  GLUCAP 130*   Lipid Profile: No results for input(s): CHOL, HDL, LDLCALC, TRIG, CHOLHDL, LDLDIRECT in the last 72 hours. Thyroid Function Tests: No results for input(s): TSH, T4TOTAL, FREET4, T3FREE, THYROIDAB in the last 72 hours. Anemia Panel: No results for input(s): VITAMINB12, FOLATE, FERRITIN, TIBC, IRON, RETICCTPCT in the last 72 hours. Urine analysis:  Component Value Date/Time   COLORURINE YELLOW 08/22/2019 1102   APPEARANCEUR CLEAR 08/22/2019 1102   LABSPEC 1.014 08/22/2019 1102   PHURINE 8.0 08/22/2019 1102   GLUCOSEU 50 (A) 08/22/2019 1102   HGBUR NEGATIVE 08/22/2019 1102   BILIRUBINUR NEGATIVE 08/22/2019 1102   KETONESUR 20 (A) 08/22/2019 1102   PROTEINUR NEGATIVE 08/22/2019 1102   UROBILINOGEN 0.2 06/06/2011 1803   NITRITE NEGATIVE 08/22/2019 1102   LEUKOCYTESUR NEGATIVE 08/22/2019 1102   Sepsis Labs: @LABRCNTIP (procalcitonin:4,lacticidven:4)  ) Recent Results (from the past 240 hour(s))  Resp Panel by RT-PCR (Flu A&B, Covid) Nasopharyngeal Swab     Status: None   Collection Time: 06/10/20 11:39 PM   Specimen: Nasopharyngeal Swab;  Nasopharyngeal(NP) swabs in vial transport medium  Result Value Ref Range Status   SARS Coronavirus 2 by RT PCR NEGATIVE NEGATIVE Final    Comment: (NOTE) SARS-CoV-2 target nucleic acids are NOT DETECTED.  The SARS-CoV-2 RNA is generally detectable in upper respiratory specimens during the acute phase of infection. The lowest concentration of SARS-CoV-2 viral copies this assay can detect is 138 copies/mL. A negative result does not preclude SARS-Cov-2 infection and should not be used as the sole basis for treatment or other patient management decisions. A negative result may occur with  improper specimen collection/handling, submission of specimen other than nasopharyngeal swab, presence of viral mutation(s) within the areas targeted by this assay, and inadequate number of viral copies(<138 copies/mL). A negative result must be combined with clinical observations, patient history, and epidemiological information. The expected result is Negative.  Fact Sheet for Patients:  EntrepreneurPulse.com.au  Fact Sheet for Healthcare Providers:  IncredibleEmployment.be  This test is no t yet approved or cleared by the [place] FDA and  has been authorized for detection and/or diagnosis of SARS-CoV-2 by FDA under an Emergency Use Authorization (EUA). This EUA will remain  in effect (meaning this test can be used) for the duration of the COVID-19 declaration under Section 564(b)(1) of the Act, 21 U.S.C.section 360bbb-3(b)(1), unless the authorization is terminated  or revoked sooner.       Influenza A by PCR NEGATIVE NEGATIVE Final   Influenza B by PCR NEGATIVE NEGATIVE Final    Comment: (NOTE) The Xpert Xpress SARS-CoV-2/FLU/RSV plus assay is intended as an aid in the diagnosis of influenza from Nasopharyngeal swab specimens and should not be used as a sole basis for treatment. Nasal washings and aspirates are unacceptable for Xpert Xpress  SARS-CoV-2/FLU/RSV testing.  Fact Sheet for Patients: EntrepreneurPulse.com.au  Fact Sheet for Healthcare Providers: IncredibleEmployment.be  This test is not yet approved or cleared by the [place] FDA and has been authorized for detection and/or diagnosis of SARS-CoV-2 by FDA under an Emergency Use Authorization (EUA). This EUA will remain in effect (meaning this test can be used) for the duration of the COVID-19 declaration under Section 564(b)(1) of the Act, 21 U.S.C. section 360bbb-3(b)(1), unless the authorization is terminated or revoked.  Performed at [facility], [place] [address], [place], [place] [address]   MRSA PCR Screening     Status: None   Collection Time: 06/11/20  5:53 PM   Specimen: Nasopharyngeal  Result Value Ref Range Status   MRSA by PCR NEGATIVE NEGATIVE Final    Comment:        The GeneXpert MRSA Assay (FDA approved for NASAL specimens only), is one component of a comprehensive MRSA colonization surveillance program. It is not intended to diagnose MRSA infection nor to guide or monitor treatment for MRSA  infections. Performed at [facility], [address] [place], [place] [address]   Culture, blood (routine x 2)     Status: None (Preliminary result)   Collection Time: 06/13/20  6:47 AM   Specimen: BLOOD LEFT HAND  Result Value Ref Range Status   Specimen Description BLOOD LEFT HAND  Final   Special Requests   Final    BOTTLES DRAWN AEROBIC ONLY Blood Culture results may not be optimal due to an inadequate volume of blood received in culture bottles   Culture   Final    NO GROWTH 1 DAY Performed at [facility], [address] [place], [place] [address]    Report Status PENDING  Incomplete  Culture, blood (routine x 2)     Status: None (Preliminary result)   Collection Time: 06/13/20  7:00 AM   Specimen: BLOOD LEFT HAND  Result Value Ref Range Status   Specimen Description BLOOD  LEFT HAND  Final   Special Requests   Final    BOTTLES DRAWN AEROBIC ONLY Blood Culture results may not be optimal due to an inadequate volume of blood received in culture bottles   Culture   Final    NO GROWTH 1 DAY Performed at [facility], [address] [place], [place] [address]    Report Status PENDING  Incomplete      Studies: No results found.  Scheduled Meds: . acetaminophen  325 mg Oral Daily  . atorvastatin  10 mg Oral QPM  . bacitracin-polymyxin b   Right Eye BID  . baclofen  10 mg Oral TID  . divalproex  500 mg Oral BID  . memantine  28 mg Oral Daily   And  . donepezil  10 mg Oral Daily  . escitalopram  30 mg Oral Daily  . heparin  5,000 Units Subcutaneous Q8H  . levETIRAcetam  500 mg Oral BID  . levothyroxine  150 mcg Oral QAC breakfast  . mirabegron ER  50 mg Oral Daily  . neomycin-bacitracin-polymyxin  1 application Topical Daily  . pantoprazole  40 mg Oral QHS  . polyvinyl alcohol  1 drop Both Eyes Q4H while awake  . saccharomyces boulardii  250 mg Oral BID  . sodium chloride flush  3 mL Intravenous Q12H  . traZODone  100 mg Oral QHS    Continuous Infusions: . sodium chloride    . dextrose 5 % and 0.45% NaCl 100 mL/hr at 06/13/20 1643  . piperacillin-tazobactam (ZOSYN)  IV 3.375 g (06/14/20 1338)     LOS: 3 days     Darlin Drop, MD [facility] Pager [phone]  If 7PM-7AM, please contact night-coverage [email or web address] Password [facility] 06/14/2020, 2:36 PM

## 2020-06-14 NOTE — Progress Notes (Signed)
Pt had a nose bleed. Pt is on oxygen nasal cannula at 5 LPM. This RN added humidifier to oxygen to help prevent nose from becoming too dry. Pt also had blood on fingers, could have been picking nose too. RN and NT washed patients face and hands. RN will continue to monitor.   Larey Days, RN

## 2020-06-14 NOTE — TOC Initial Note (Signed)
Transition of Care [facility]) - Initial/Assessment Note    Patient Details  Name: Willie [name] MRN: [id number] Date of Birth: Mar 20, 1941  [facility] CM/SW Contact:    Bess Kinds, RN Phone Number: [phone] 06/14/2020, 4:41 PM  Clinical Narrative:                  Spoke with patient's daughter in law, Raynelle Fanning, on the phone and at the bedside this afternoon. Patient is a resident at [facility] memory care. Discussed [facility] vs back to [place] with [facility]. Raynelle Fanning is agreeable to either option. Spoke with Henrene Dodge at [facility], they can provide care to patient while on hospice. Referral sent to [facility] [place] pending acceptance. TOC following for transition needs.   Expected Discharge Plan: Hospice Medical Facility Barriers to Discharge: Hospice Bed not available   Patient Goals and CMS Choice Patient states their goals for this hospitalization and ongoing recovery are:: hospice care CMS Medicare.gov Compare Post Acute Care list provided to:: Patient Represenative (must comment) Raynelle Fanning, daughter in law) Choice offered to / list presented to : Adult Children  Expected Discharge Plan and Services Expected Discharge Plan: Hospice Medical Facility In-house Referral: Hospice / Palliative Care Discharge Planning Services: CM Consult Post Acute Care Choice: Hospice Living arrangements for the past 2 months: Assisted Living Facility                 DME Arranged: N/A DME Agency: NA       HH Arranged: NA HH Agency: NA        Prior Living Arrangements/Services Living arrangements for the past 2 months: Assisted Living Facility Lives with:: Facility Resident              Current home services: DME (wheelchair, rollator, bsc)    Activities of Daily Living Home Assistive Devices/Equipment: Wheelchair ADL Screening (condition at time of admission) Patient's cognitive ability adequate to safely  complete daily activities?: No Is the patient deaf or have difficulty hearing?: Yes Does the patient have difficulty seeing, even when wearing glasses/contacts?: Yes Does the patient have difficulty concentrating, remembering, or making decisions?: Yes Patient able to express need for assistance with ADLs?: Yes Does the patient have difficulty dressing or bathing?: No Independently performs ADLs?: Yes (appropriate for developmental age) Does the patient have difficulty walking or climbing stairs?: Yes Weakness of Legs: Both Weakness of Arms/Hands: Both  Permission Sought/Granted                  Emotional Assessment         Alcohol / Substance Use: Not Applicable Psych Involvement: No (comment)  Admission diagnosis:  Aspiration into lower respiratory tract, initial encounter [T17.800A] Aspiration pneumonia of right lower lobe, unspecified aspiration pneumonia type [facility]) [J69.0] Patient Active Problem List   Diagnosis Date Noted  . Aspiration into lower respiratory tract, initial encounter 06/11/2020  . Pneumonia   . Dementia without behavioral disturbance (HCC)   . Hypernatremia   . Hypokalemia   . Lung nodule < 6cm on CT 02/10/2020  . Malignant neoplasm of prostate (HCC) 01/21/2019  . Generalized weakness 01/01/2019  . Acute metabolic encephalopathy 12/31/2018  . Controlled type 2 diabetes mellitus with hypoglycemia (HCC) 12/31/2018  . Hypothyroidism 12/31/2018  . Normocytic normochromic anemia 12/31/2018  . Hyponatremia 12/31/2018  . Nuclear sclerotic cataract of both eyes 09/03/2014  . 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 UGIB (upper  gastrointestinal bleed) 08/16/2013  . HTN (hypertension) 08/15/2013  . Diabetes type 2, uncontrolled (HCC) 08/15/2013  . HLD (hyperlipidemia) 08/15/2013  . ICH (intracerebral hemorrhage) (HCC) 08/15/2013   PCP:  Toma Deiters, NP Pharmacy:   [facility] [place] [place], [place] - [address] [facility] [place]. [address] [place] [address] Phone: [phone] Fax: [phone]     Social Determinants of Health (SDOH) Interventions    Readmission Risk Interventions No flowsheet data found.

## 2020-06-15 DIAGNOSIS — L8990 Pressure ulcer of unspecified site, unspecified stage: Secondary | ICD-10-CM | POA: Insufficient documentation

## 2020-06-15 DIAGNOSIS — B59 Pneumocystosis: Secondary | ICD-10-CM | POA: Diagnosis not present

## 2020-06-15 LAB — PROCALCITONIN: Procalcitonin: 0.27 ng/mL

## 2020-06-15 NOTE — Discharge Summary (Signed)
Willie [name] [id number] DOB: 07-Mar-1941 DOA: 06/10/2020  PCP: Harvel Quale, NP  Admit date: 06/10/2020  Discharge date: 06/15/2020  [facility]   disposition: [facility]: N/A Equipment/Devices: N/A Consultations: Palliative care, speech therapy Discharge Condition: Poor, to be discharged to hospice CODE STATUS: DNR Diet Recommendation: Heart Healthy dysphagia 1  Diet Order            Diet general           DIET - DYS 1 Room service appropriate? Yes; Fluid consistency: Honey Thick  Diet effective now                  Chief Complaint  Patient presents with  . Weakness     Brief history of present illness from the day of admission and additional interim summary     Willie Darragh Smithis a 80 y.o.malewith medical history significant fordementia, GERD, hypertension, hypothyroidism, seizure disorder, prior stroke and remote history of alcohol abuse who presented to the emergency department because he was noted to not be acting quite himself associated with generalized weakness and a choking event at SNF.    On arrival in the ED his oxygenation was 88% on room air and he was placed on 2 L with improvement. He was also noted to have right lower lobe pneumonia most likely from aspiration and was started on Zosyn in the emergency department. He has a mild leukocytosis.  He was seen by speech therapist, reported severe dysphagia.  Speech therapist discussed with patient's son about his severe risk of aspiration.  His son agrees to continue with pured diet and honey thick liquids which is the most restrictive, safest diet we can manage.  Speech therapist will continue to follow.    Patient was seen by palliative care consultation and ongoing discussion regarding  long-term natural history of disease resulted in patient being made DNR and decision to transition to comfort care and discharged to hospice.                                                                  Hospital Course    Aspiration pneumonia, POA Patient has completed a 5-day course of Zosyn  Persistent hypovolemic hypernatremia in the setting of poor oral intake Hyponatremia has improved slowly, last sodium was 151 yesterday.  Acute hypoxic respiratory failure secondary to aspiration pneumonia Continue oxygen 5 L to maintain O2 saturation greater than 92%  Severe dysphagia with concern for recurrent and silent aspiration Seen by speech therapist He is currently on pleasure feedings per family wishes Continue aspiration precautions as able Discussed the high risks of aspiration with family and patient is being discharged directly to hospice house rather than to his assisted living facility/memory care with hospice.  Hypothyroidism Continue home levothyroxine  Resolved post repletion: Hypokalemia Potassium yesterday was 3.3, no further potassium checks are warranted  Physical debility Continue fall precautions  Severe protein calorie malnutrition BMI 16 Severe muscle wasting Continue oral supplement as able He is requiring feeding assistance.  Goals of care Palliative care team following DNR Very poor prognosis due to severe dysphagia, recurrent aspiration with concern for silent aspiration, acute hypoxic respiratory failure, severe protein calorie malnutrition, poor functional status.     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   Rapaflo [silodosin] Other (See Comments)   "Allergic," per [facility]      Medication List    STOP taking these medications   acetaminophen 325 MG tablet Commonly known as: TYLENOL   baclofen 10 MG tablet Commonly known as: LIORESAL   BAZA PROTECT EX   divalproex 125 MG capsule Commonly known as: DEPAKOTE SPRINKLE   escitalopram 20 MG tablet Commonly known as: Lexapro   GenTeal Tears 0.1-0.2-0.3 % Soln   levETIRAcetam 500 MG tablet Commonly known as: KEPPRA   LORazepam 1 MG tablet Commonly known as: Ativan   mirabegron ER 50 MG Tb24 tablet Commonly known as: MYRBETRIQ   Namzaric 28-10 MG Cp24 Generic drug: Memantine HCl-Donepezil HCl   neomycin-bacitracin-polymyxin [phone] ointment   nystatin cream Commonly known as: MYCOSTATIN   pantoprazole 40 MG tablet Commonly known as: PROTONIX   polyethylene glycol 17 g packet Commonly known as: MIRALAX / GLYCOLAX   Sennosides 15 MG Tabs   Synthroid 150 MCG tablet Generic drug: levothyroxine   Systane 0.4-0.3 % Gel ophthalmic gel Generic drug: Polyethyl Glycol-Propyl Glycol   Systane 0.4-0.3 % Soln Generic drug: Polyethyl Glycol-Propyl Glycol   traZODone 100 MG tablet Commonly known as: [place]            Discharge Care Instructions  (From admission, onward)         Start     Ordered   06/15/20 0000  Discharge wound care:       Comments: As noted above   06/15/20 1417            Major procedures and Radiology Reports - PLEASE review detailed and final reports thoroughly  -        DG CHEST PORT 1 VIEW  Result Date: 06/14/2020 CLINICAL DATA:  Initial evaluation for  rales. EXAM: PORTABLE CHEST 1 VIEW COMPARISON:  Prior radiograph from 06/10/2020 FINDINGS: Cardiac and mediastinal silhouettes are stable, and remain within normal limits. Lungs mildly hypoinflated. There is increased patchy and hazy opacity within the mid and lower right lung, concerning for progressive pneumonia. Superimposed veiling opacity consistent with a layering effusion, increased and/or new from prior. Increased patchy density at the medial left lung base also  suspicious for infiltrate. No overt pulmonary edema. No pneumothorax. Subacute appearing right-sided rib fractures partially visualized, grossly stable. Degenerative changes noted about the shoulders. No new osseous abnormality. IMPRESSION: 1. Increased patchy and hazy opacity within the mid and lower right lung, concerning for progressive pneumonia. Superimposed veiling opacity consistent with pleural effusion. 2. Increased patchy density at the mid left lung base, also concerning for infiltrate. Electronically Signed   By: Rise Mu M.D.   On: 06/14/2020 19:30   DG Chest Port 1 View  Result Date: 06/10/2020 CLINICAL DATA:  Choked on dinner.  Possible aspiration. EXAM: PORTABLE CHEST 1 VIEW COMPARISON:  Radiograph 08/22/2019 FINDINGS: Moderate patchy opacities throughout the right lower lung zone. The left lung is clear. The heart is normal in size. Normal mediastinal contours with aortic atherosclerosis. No pneumothorax or pleural effusion. Right lower lateral rib fractures with incomplete callus formation and are either subacute or chronic. No pulmonary edema. Bones are diffusely under mineralized. IMPRESSION: 1. Moderate patchy opacities throughout the right lower lung zone suspicious for pneumonia, including aspiration. 2. Right lower lateral rib fractures with incomplete callus formation, likely subacute. Electronically Signed   By: Narda Rutherford M.D.   On: 06/10/2020 23:20    Micro Results    Recent Results (from the  past 240 hour(s))  Resp Panel by RT-PCR (Flu A&B, Covid) Nasopharyngeal Swab     Status: None   Collection Time: 06/10/20 11:39 PM   Specimen: Nasopharyngeal Swab; Nasopharyngeal(NP) swabs in vial transport medium  Result Value Ref Range Status   SARS Coronavirus 2 by RT PCR NEGATIVE NEGATIVE Final    Comment: (NOTE) SARS-CoV-2 target nucleic acids are NOT DETECTED.  The SARS-CoV-2 RNA is generally detectable in upper respiratory specimens during the acute phase of infection. The lowest concentration of SARS-CoV-2 viral copies this assay can detect is 138 copies/mL. A negative result does not preclude SARS-Cov-2 infection and should not be used as the sole basis for treatment or other patient management decisions. A negative result may occur with  improper specimen collection/handling, submission of specimen other than nasopharyngeal swab, presence of viral mutation(s) within the areas targeted by this assay, and inadequate number of viral copies(<138 copies/mL). A negative result must be combined with clinical observations, patient history, and epidemiological information. The expected result is Negative.  Fact Sheet for Patients:  BloggerCourse.com  Fact Sheet for Healthcare Providers:  SeriousBroker.it  This test is no t yet approved or cleared by the [place] FDA and  has been authorized for detection and/or diagnosis of SARS-CoV-2 by FDA under an Emergency Use Authorization (EUA). This EUA will remain  in effect (meaning this test can be used) for the duration of the COVID-19 declaration under Section 564(b)(1) of the Act, 21 U.S.C.section 360bbb-3(b)(1), unless the authorization is terminated  or revoked sooner.       Influenza A by PCR NEGATIVE NEGATIVE Final   Influenza B by PCR NEGATIVE NEGATIVE Final    Comment: (NOTE) The Xpert Xpress SARS-CoV-2/FLU/RSV plus assay is intended as an aid in the diagnosis of  influenza from Nasopharyngeal swab specimens and should not be used as a sole basis for treatment. Nasal washings and aspirates are unacceptable for Xpert Xpress SARS-CoV-2/FLU/RSV testing.  Fact Sheet for Patients: BloggerCourse.com  Fact Sheet for Healthcare Providers: SeriousBroker.it  This test is not yet approved or cleared by the [place] FDA and has been authorized for detection and/or diagnosis of SARS-CoV-2 by FDA under an Emergency Use Authorization (EUA). This EUA will  remain in effect (meaning this test can be used) for the duration of the COVID-19 declaration under Section 564(b)(1) of the Act, 21 U.S.C. section 360bbb-3(b)(1), unless the authorization is terminated or revoked.  Performed at [facility], [place] [address] [place], [place] [address]   MRSA PCR Screening     Status: None   Collection Time: 06/11/20  5:53 PM   Specimen: Nasopharyngeal  Result Value Ref Range Status   MRSA by PCR NEGATIVE NEGATIVE Final    Comment:        The GeneXpert MRSA Assay (FDA approved for NASAL specimens only), is one component of a comprehensive MRSA colonization surveillance program. It is not intended to diagnose MRSA infection nor to guide or monitor treatment for MRSA infections. Performed at [facility], [place] [address], [place], [place] [address]   Culture, blood (routine x 2)     Status: None (Preliminary result)   Collection Time: 06/13/20  6:47 AM   Specimen: BLOOD LEFT HAND  Result Value Ref Range Status   Specimen Description BLOOD LEFT HAND  Final   Special Requests   Final    BOTTLES DRAWN AEROBIC ONLY Blood Culture results may not be optimal due to an inadequate volume of blood received in culture bottles   Culture   Final    NO GROWTH 2 DAYS Performed at [facility], [place] [address] [place], [place] [address]    Report Status PENDING  Incomplete  Culture, blood (routine x 2)      Status: None (Preliminary result)   Collection Time: 06/13/20  7:00 AM   Specimen: BLOOD LEFT HAND  Result Value Ref Range Status   Specimen Description BLOOD LEFT HAND  Final   Special Requests   Final    BOTTLES DRAWN AEROBIC ONLY Blood Culture results may not be optimal due to an inadequate volume of blood received in culture bottles   Culture   Final    NO GROWTH 2 DAYS Performed at [facility], [place] [address] [place], [place] [address]    Report Status PENDING  Incomplete    Today   Subjective    Willie [name] states he feels tired.  Is aware he is going to hospice house.  Objective   Blood pressure (!) 141/60, pulse 62, temperature 98.9 F (37.2 C), temperature source Oral, resp. rate 18, height 6' (1.829 m), weight 53.5 kg, SpO2 96 %.   Intake/Output Summary (Last 24 hours) at 06/15/2020 1417 Last data filed at 06/15/2020 1317 Gross per 24 hour  Intake 350 ml  Output 275 ml  Net 75 ml    Exam General:  Weak appearing cachectic man lying in bed in no acute distress.   Eyes: sclera anicteric, conjuctiva mild injection bilaterally CVS: S1-S2, regular  Respiratory:   Coarse transmitted upper respiratory sounds. GI: NABS, soft, NT  LE: No edema.  Neuro: grossly nonfocal.     Data Review   CBC w Diff:  Lab Results  Component Value Date   WBC 15.6 (H) 06/14/2020   HGB 9.5 (L) 06/14/2020   HCT 29.8 (L) 06/14/2020   PLT 253 06/14/2020   LYMPHOPCT 10 06/14/2020   MONOPCT 2 06/14/2020   EOSPCT 0 06/14/2020   BASOPCT 0 06/14/2020    CMP:  Lab Results  Component Value Date   NA 151 (H) 06/14/2020   K 3.3 (L) 06/14/2020   CL 109 06/14/2020   CO2 30 06/14/2020   BUN  29 (H) 06/14/2020   CREATININE 1.01 06/14/2020   PROT 6.3 (L) 06/10/2020   ALBUMIN 2.1 (L) 06/10/2020   BILITOT 0.5 06/10/2020   ALKPHOS 72 06/10/2020   AST 10 (L) 06/10/2020   ALT 9 06/10/2020  .   Total Time in preparing paper work, data evaluation and todays exam - 35  minutes  Vashti Hey M.D on 06/15/2020 at 2:17 PM  [facility]   Office  [phone]

## 2020-06-15 NOTE — Progress Notes (Signed)
   Referral received from [facility] with transitional care unit. I have spoke to the pt's son who is out of town and the DIL both. We have discussed comfort feeding and hospice approach as well end of life care a the [facility] They are in agreement with comfort care and would like to proceed with referral process. The pt has been approved for hospice care at [facility] house and the son has given Raynelle Fanning DIL permission to sign paperwork in his absence.  The phone number for nurse to call report will be [phone]  Spoke to [facility] who will arrange transportt after MD has completed d/c paperwork.  Norm Parcel RN [phone]

## 2020-06-15 NOTE — TOC Transition Note (Signed)
Transition of Care [facility]) - CM/SW Discharge Note   Patient Details  Name: Willie [name] MRN: [id number] Date of Birth: Sep 14, 1940  [facility]) CM/SW Contact:  Bess Kinds, RN Phone Number: [phone] 06/15/2020, 1:59 PM   Clinical Narrative:     Notified by Dennard Nip at [facility] that patient has qualified for inpatient hospice facility. Family has accepted bed at [facility] [place]. Bed available today. MD notified. Nursing notified. Will arrange PTAR transport once notified by hospice of admission paperwork complete.   Final next level of care: Hospice Medical Facility Barriers to Discharge: No Barriers Identified   Patient Goals and CMS Choice Patient states their goals for this hospitalization and ongoing recovery are:: hospice care CMS Medicare.gov Compare Post Acute Care list provided to:: Patient Represenative (must comment) Choice offered to / list presented to : Adult Children  Discharge Placement                Patient to be transferred to facility by: PTAR Name of family member notified: Raynelle Fanning Patient and family notified of of transfer: 06/15/20  Discharge Plan and Services In-house Referral: Hospice / Palliative Care Discharge Planning Services: CM Consult Post Acute Care Choice: Hospice          DME Arranged: N/A DME Agency: NA       HH Arranged: NA HH Agency: NA        Social Determinants of Health (SDOH) Interventions     Readmission Risk Interventions No flowsheet data found.

## 2020-06-15 NOTE — Progress Notes (Signed)
  Speech Language Pathology Treatment: Dysphagia  Patient Details Name: Willie [name] MRN: [id number] DOB: 1940/09/17 Today's Date: 06/15/2020 Time: 6761-9509 SLP Time Calculation (min) (ACUTE ONLY): 14 min  Assessment / Plan / Recommendation Clinical Impression  SLP followed up for education with family, pts daughter in law at bedside. Reviewed recommendations for swallowing safety; strategies at head of bed in written form. Pt assessed with single ice chips following oral care, puree, and honey thick liquids. Note prolonged mastication with delayed AP transit and suspected delay in swallow initiation. Pt with overt cough following honey thick liquids consistent with recent ST encounters. Per daughter in law report, plans for DC with hospice this date. No further needs identified.      HPI HPI: 80yo male admitted 06/10/20 with AMS, weakness. Choking episode 06/09/20. PMH: dementia diabetes, GERD, HTN, hypothyroidism, seizures, prior stroke and remote history of alcohol abuse. He was already on mechanical soft diet with nectar thick liquid but his family thinks he may have been aspirating slowly or silently. Pt has been on nectar thick liquids since march of 2021 after clnical assessment showed frequent throat clearing with thin. No MBS due to mentation. Currently CXR shows moderate patchy opacities throughout the right lower lung zone suspicious for pneumonia, including aspiration. BSE completed 06/11/20      SLP Plan  Discharge SLP treatment due to (comment) (pt plan for hospice)       Recommendations  Diet recommendations: Dysphagia 1 (puree);Honey-thick liquid Liquids provided via: Cup;Teaspoon Medication Administration: Crushed with puree Supervision: Full supervision/cueing for compensatory strategies;Trained caregiver to feed patient Compensations: Slow rate;Small sips/bites;Minimize environmental distractions Postural Changes and/or Swallow Maneuvers: Seated upright 90  degrees;Upright 30-60 min after meal                Oral Care Recommendations: Oral care before and after PO Follow up Recommendations: Other (comment) (plans for hospice) SLP Visit Diagnosis: Dysphagia, oropharyngeal phase (R13.12) Plan: Discharge SLP treatment due to (comment) (pt plan for hospice)       GO                Marqus Macphee E Keylen Eckenrode MA, CCC-SLP Acute Rehabilitation Services  06/15/2020, 1:58 PM

## 2020-06-15 NOTE — TOC Transition Note (Signed)
Transition of Care [facility]) - CM/SW Discharge Note   Patient Details  Name: Willie [name] MRN: [id number] Date of Birth: [birth year]  Transition [facility] Care [facility]) CM/SW Contact:  Bess Kinds, RN Phone Number: [phone] 06/15/2020, 3:53 PM   Clinical Narrative:     Notified by Dennard Nip at [facility] [place] that admission paperwork was complete. PTAR contacted for transport. Raynelle Fanning, patient's daughter in law, notified of transport arrangements, and notified again of transport arrival. Notified Cheri of transport arrival. No further TOC needs identified.   Final next level of care: Hospice Medical Facility Barriers to Discharge: No Barriers Identified   Patient Goals and CMS Choice Patient states their goals for this hospitalization and ongoing recovery are:: hospice care CMS Medicare.gov Compare Post Acute Care list provided to:: Patient Represenative (must comment) Choice offered to / list presented to : Adult Children  Discharge Placement                Patient to be transferred to facility by: PTAR Name of family member notified: Raynelle Fanning Patient and family notified of of transfer: 06/15/20  Discharge Plan and Services In-house Referral: Hospice / Palliative Care Discharge Planning Services: CM Consult Post Acute Care Choice: Hospice          DME Arranged: N/A DME Agency: NA       HH Arranged: NA HH Agency: NA        Social Determinants of Health (SDOH) Interventions     Readmission Risk Interventions No flowsheet data found.

## 2020-06-15 NOTE — Progress Notes (Signed)
Report given to Cherry RN

## 2020-06-15 NOTE — Progress Notes (Signed)
DISCHARge Elige Radon to be discharged to [facility] hospice per MD order. Patient verbalized understanding.  Skin clean, dry and intact without evidence of skin break down, no evidence of skin tears noted. IV catheter discontinued intact. Site without signs and symptoms of complications. Dressing and pressure applied. Pt denies pain at the site currently. No complaints noted.  Patient free of lines, drains, and wounds.   Discharge packet assembled. An After Visit Summary (AVS) was printed and given to the EMS personnel. Patient escorted via stretcher and discharged to [facility] via ambulance. Report called to accepting facility; all questions and concern per MD order. Patient verbalized understanding.Report given to [facility]  Skin clean, dry and intact without evidence of skin break down, no evidence of skin tears noted. IV catheter discontinued intact. Site without signs and symptoms of complications. Dressing and pressure app Patient free of lines, drains, and wounds.   Discharge packet assembled. An After Visit Summary (AVS) was printed and given to the EMS personnel. Patient escorted via stretcher and discharged to [facility] via ambulance. Report called to accepting facility; all questions and concerns addressed.   Thurman, Kem Kays, RN

## 2020-06-19 LAB — CULTURE, BLOOD (ROUTINE X 2)
Culture: NO GROWTH
Culture: NO GROWTH

## 2020-06-21 ENCOUNTER — Encounter (INDEPENDENT_AMBULATORY_CARE_PROVIDER_SITE_OTHER): Payer: Medicare Other | Admitting: Ophthalmology

## 2020-07-13 DEATH — deceased

## 2021-06-26 IMAGING — DX DG CHEST 1V PORT
1 series · 1 of 1 positions shown · non-contrast
Comparison: Prior radiograph from 06/10/2020

CLINICAL DATA: Initial evaluation for rales.

EXAM:
PORTABLE CHEST 1 VIEW

[chest]
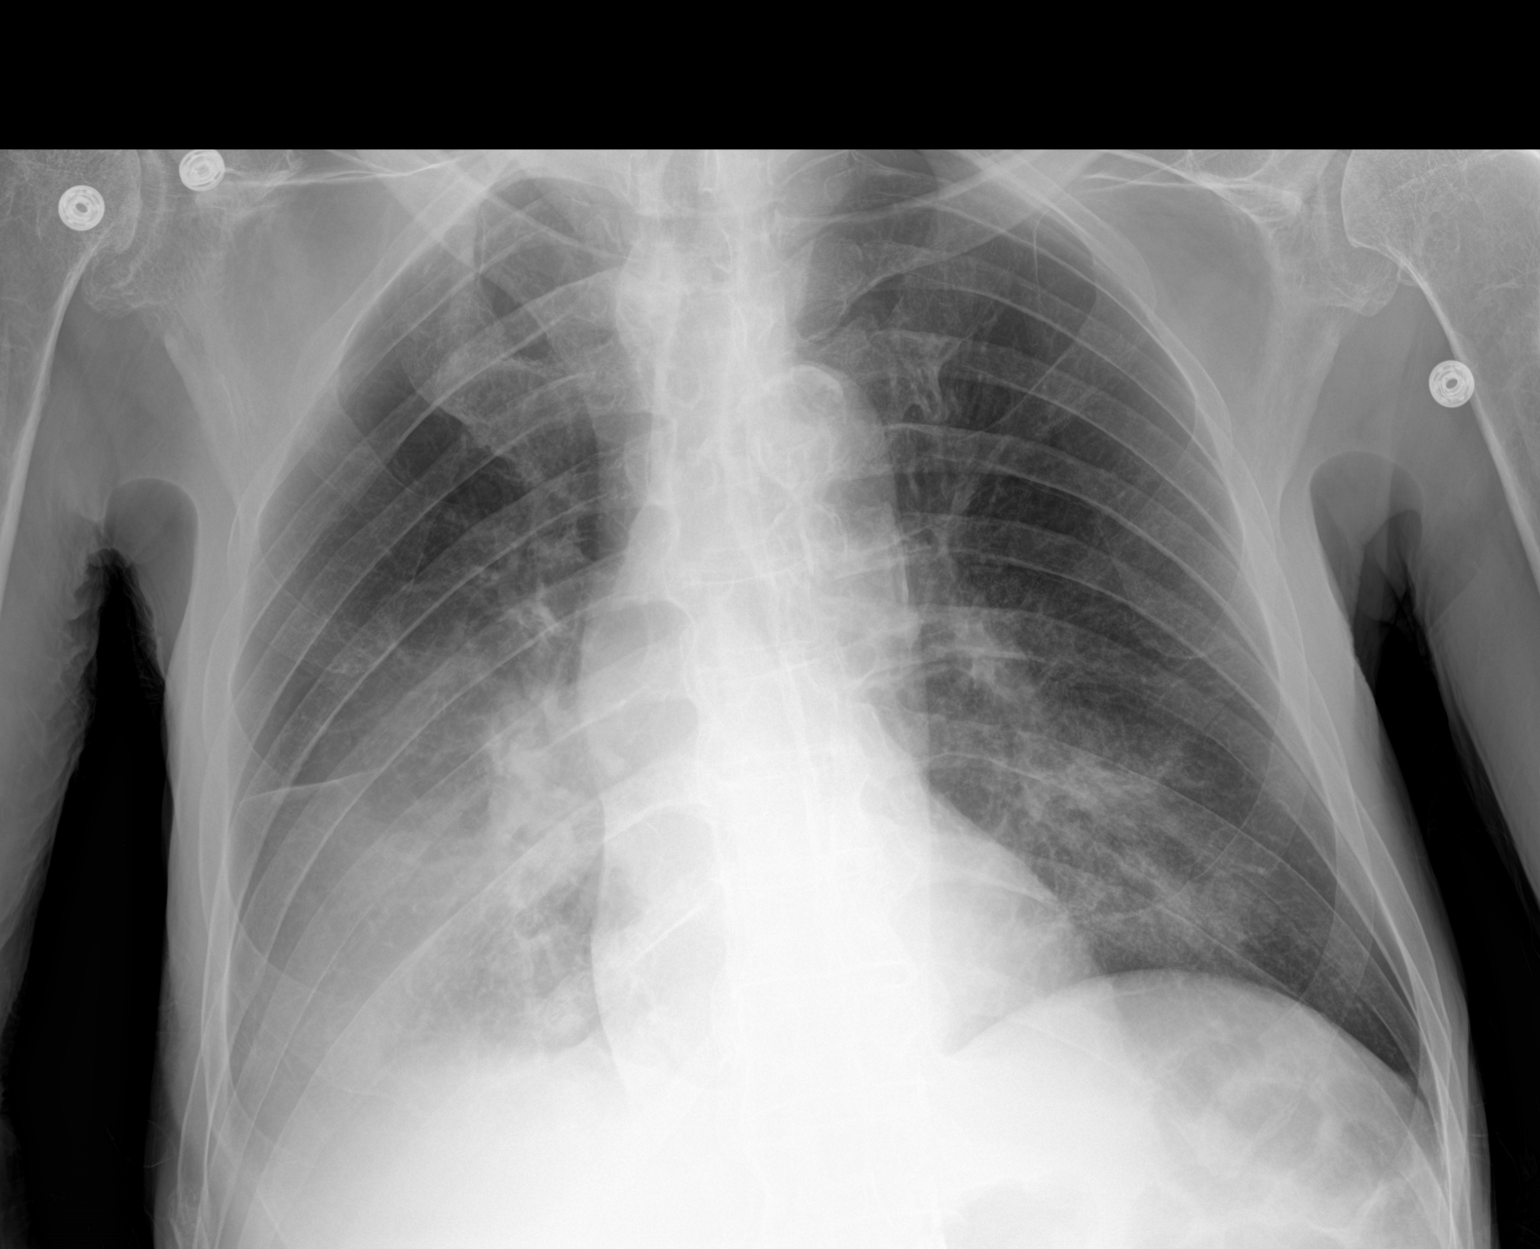

[1 of 1 positions shown; findings below may reference images not displayed]

FINDINGS: Cardiac and mediastinal silhouettes are stable, and remain within
normal limits.

Lungs mildly hypoinflated. There is increased patchy and hazy
opacity within the mid and lower right lung, concerning for
progressive pneumonia. Superimposed veiling opacity consistent with
a layering effusion, increased and/or new from prior. Increased
patchy density at the medial left lung base also suspicious for
infiltrate. No overt pulmonary edema. No pneumothorax.

Subacute appearing right-sided rib fractures partially visualized,
grossly stable. Degenerative changes noted about the shoulders. No
new osseous abnormality.
IMPRESSION: 1. Increased patchy and hazy opacity within the mid and lower right
lung, concerning for progressive pneumonia. Superimposed veiling
opacity consistent with pleural effusion.
2. Increased patchy density at the mid left lung base, also
concerning for infiltrate.
